# Patient Record
Sex: Female | Born: 1935
Health system: Southern US, Community
[De-identification: ages and names within clinical notes are randomized; demographics above are authoritative.]

## PROBLEM LIST (undated history)

## (undated) DIAGNOSIS — E785 Hyperlipidemia, unspecified: Secondary | ICD-10-CM

## (undated) DIAGNOSIS — M160 Bilateral primary osteoarthritis of hip: Secondary | ICD-10-CM

## (undated) DIAGNOSIS — Z8639 Personal history of other endocrine, nutritional and metabolic disease: Secondary | ICD-10-CM

## (undated) DIAGNOSIS — H47011 Ischemic optic neuropathy, right eye: Secondary | ICD-10-CM

## (undated) DIAGNOSIS — E079 Disorder of thyroid, unspecified: Secondary | ICD-10-CM

## (undated) HISTORY — DX: Ischemic optic neuropathy, right eye: H47.011

## (undated) HISTORY — DX: Bilateral primary osteoarthritis of hip: M16.0

## (undated) HISTORY — PX: TOTAL HIP ARTHROPLASTY: SHX124

## (undated) HISTORY — DX: Personal history of other endocrine, nutritional and metabolic disease: Z86.39

## (undated) HISTORY — DX: Hyperlipidemia, unspecified: E78.5

## (undated) HISTORY — PX: ANTERIOR FUSION LUMBAR SPINE: SUR629

## (undated) HISTORY — PX: TOTAL ABDOMINAL HYSTERECTOMY: SHX209

---

## 1998-04-30 ENCOUNTER — Other Ambulatory Visit: Admission: RE | Admit: 1998-04-30 | Discharge: 1998-04-30 | Payer: Self-pay | Admitting: *Deleted

## 1999-04-05 ENCOUNTER — Encounter: Payer: Self-pay | Admitting: *Deleted

## 1999-04-05 ENCOUNTER — Encounter: Admission: RE | Admit: 1999-04-05 | Discharge: 1999-04-05 | Payer: Self-pay | Admitting: *Deleted

## 1999-04-26 ENCOUNTER — Other Ambulatory Visit: Admission: RE | Admit: 1999-04-26 | Discharge: 1999-04-26 | Payer: Self-pay | Admitting: *Deleted

## 1999-09-26 ENCOUNTER — Encounter: Admission: RE | Admit: 1999-09-26 | Discharge: 1999-09-26 | Payer: Self-pay | Admitting: Family Medicine

## 1999-09-26 ENCOUNTER — Encounter: Payer: Self-pay | Admitting: Family Medicine

## 2000-05-12 ENCOUNTER — Other Ambulatory Visit: Admission: RE | Admit: 2000-05-12 | Discharge: 2000-05-12 | Payer: Self-pay | Admitting: *Deleted

## 2001-03-17 ENCOUNTER — Encounter: Admission: RE | Admit: 2001-03-17 | Discharge: 2001-03-17 | Payer: Self-pay | Admitting: *Deleted

## 2001-03-17 ENCOUNTER — Encounter: Payer: Self-pay | Admitting: *Deleted

## 2002-04-25 ENCOUNTER — Encounter: Payer: Self-pay | Admitting: *Deleted

## 2002-04-25 ENCOUNTER — Encounter: Admission: RE | Admit: 2002-04-25 | Discharge: 2002-04-25 | Payer: Self-pay | Admitting: *Deleted

## 2002-08-03 ENCOUNTER — Encounter: Admission: RE | Admit: 2002-08-03 | Discharge: 2002-08-03 | Payer: Self-pay | Admitting: Orthopedic Surgery

## 2002-08-03 ENCOUNTER — Encounter: Payer: Self-pay | Admitting: Orthopedic Surgery

## 2002-08-17 ENCOUNTER — Encounter: Admission: RE | Admit: 2002-08-17 | Discharge: 2002-08-17 | Payer: Self-pay | Admitting: Orthopedic Surgery

## 2002-08-17 ENCOUNTER — Encounter: Payer: Self-pay | Admitting: Orthopedic Surgery

## 2002-08-31 ENCOUNTER — Encounter: Payer: Self-pay | Admitting: Orthopedic Surgery

## 2002-08-31 ENCOUNTER — Encounter: Admission: RE | Admit: 2002-08-31 | Discharge: 2002-08-31 | Payer: Self-pay | Admitting: Orthopedic Surgery

## 2002-10-05 ENCOUNTER — Encounter: Admission: RE | Admit: 2002-10-05 | Discharge: 2002-10-05 | Payer: Self-pay | Admitting: Family Medicine

## 2002-10-05 ENCOUNTER — Encounter: Payer: Self-pay | Admitting: Family Medicine

## 2002-10-20 ENCOUNTER — Ambulatory Visit (HOSPITAL_COMMUNITY): Admission: RE | Admit: 2002-10-20 | Discharge: 2002-10-20 | Payer: Self-pay | Admitting: Orthopedic Surgery

## 2003-03-02 ENCOUNTER — Encounter: Payer: Self-pay | Admitting: Orthopedic Surgery

## 2003-03-02 ENCOUNTER — Encounter: Admission: RE | Admit: 2003-03-02 | Discharge: 2003-03-02 | Payer: Self-pay | Admitting: Orthopedic Surgery

## 2003-12-28 ENCOUNTER — Encounter: Admission: RE | Admit: 2003-12-28 | Discharge: 2003-12-28 | Payer: Self-pay | Admitting: Family Medicine

## 2004-04-03 ENCOUNTER — Encounter: Admission: RE | Admit: 2004-04-03 | Discharge: 2004-04-03 | Payer: Self-pay | Admitting: Family Medicine

## 2004-10-08 ENCOUNTER — Encounter: Admission: RE | Admit: 2004-10-08 | Discharge: 2004-10-08 | Payer: Self-pay | Admitting: Family Medicine

## 2006-12-25 ENCOUNTER — Encounter: Admission: RE | Admit: 2006-12-25 | Discharge: 2006-12-25 | Payer: Self-pay | Admitting: Family Medicine

## 2006-12-29 ENCOUNTER — Encounter: Admission: RE | Admit: 2006-12-29 | Discharge: 2006-12-29 | Payer: Self-pay | Admitting: Family Medicine

## 2007-05-14 ENCOUNTER — Encounter: Admission: RE | Admit: 2007-05-14 | Discharge: 2007-05-14 | Payer: Self-pay | Admitting: Unknown Physician Specialty

## 2010-04-20 ENCOUNTER — Emergency Department (HOSPITAL_BASED_OUTPATIENT_CLINIC_OR_DEPARTMENT_OTHER)
Admission: EM | Admit: 2010-04-20 | Discharge: 2010-04-20 | Payer: Self-pay | Source: Home / Self Care | Admitting: Emergency Medicine

## 2010-04-20 ENCOUNTER — Ambulatory Visit: Payer: Self-pay | Admitting: Interventional Radiology

## 2010-04-24 ENCOUNTER — Ambulatory Visit (HOSPITAL_BASED_OUTPATIENT_CLINIC_OR_DEPARTMENT_OTHER)
Admission: RE | Admit: 2010-04-24 | Discharge: 2010-04-24 | Payer: Self-pay | Source: Home / Self Care | Admitting: Family Medicine

## 2010-04-24 ENCOUNTER — Ambulatory Visit: Payer: Self-pay | Admitting: Radiology

## 2010-06-15 ENCOUNTER — Encounter: Payer: Self-pay | Admitting: Family Medicine

## 2010-08-06 LAB — COMPREHENSIVE METABOLIC PANEL
AST: 23 U/L (ref 0–37)
BUN: 21 mg/dL (ref 6–23)
Creatinine, Ser: 0.9 mg/dL (ref 0.4–1.2)
GFR calc non Af Amer: >60 mL/min (ref >60)
Glucose, Bld: 91 mg/dL (ref 70–99)
Total Bilirubin: 0.7 mg/dL (ref 0.3–1.2)

## 2010-08-06 LAB — URINALYSIS, ROUTINE W REFLEX MICROSCOPIC
Bilirubin Urine: NEGATIVE
Glucose, UA: NEGATIVE mg/dL
Hgb urine dipstick: NEGATIVE
Nitrite: NEGATIVE
Protein, ur: NEGATIVE mg/dL
Urobilinogen, UA: 0.2 mg/dL (ref 0.0–1.0)

## 2010-08-06 LAB — DIFFERENTIAL
Basophils Absolute: 0 10*3/uL (ref 0.0–0.1)
Basophils Relative: 0 % (ref 0–1)
Eosinophils Absolute: 0 10*3/uL (ref 0.0–0.7)
Eosinophils Relative: 0 % (ref 0–5)
Lymphs Abs: 1.8 10*3/uL (ref 0.7–4.0)
Metamyelocytes Relative: 1 %
Monocytes Absolute: 0.2 10*3/uL (ref 0.1–1.0)
Myelocytes: 0 %
Neutrophils Relative %: 77 % (ref 43–77)
Promyelocytes Absolute: 0 %
nRBC: 0 /100 WBC

## 2010-08-06 LAB — CBC
Hemoglobin: 15.3 g/dL — ABNORMAL HIGH (ref 12.0–15.0)
MCH: 35.3 pg — ABNORMAL HIGH (ref 26.0–34.0)
MCHC: 35 g/dL (ref 30.0–36.0)
WBC: 18.3 10*3/uL — ABNORMAL HIGH (ref 4.0–10.5)

## 2010-08-06 LAB — URINE CULTURE: Culture  Setup Time: 201111270025

## 2010-12-31 ENCOUNTER — Other Ambulatory Visit: Payer: Self-pay | Admitting: *Deleted

## 2010-12-31 DIAGNOSIS — Z1231 Encounter for screening mammogram for malignant neoplasm of breast: Secondary | ICD-10-CM

## 2011-01-08 ENCOUNTER — Other Ambulatory Visit: Payer: Self-pay | Admitting: Unknown Physician Specialty

## 2011-01-08 ENCOUNTER — Ambulatory Visit
Admission: RE | Admit: 2011-01-08 | Discharge: 2011-01-08 | Disposition: A | Discharge status: Home / Self Care | Payer: BLUE CROSS/BLUE SHIELD | Source: Ambulatory Visit | Attending: *Deleted | Admitting: *Deleted

## 2011-01-08 DIAGNOSIS — Z1231 Encounter for screening mammogram for malignant neoplasm of breast: Secondary | ICD-10-CM

## 2011-05-28 DIAGNOSIS — L259 Unspecified contact dermatitis, unspecified cause: Secondary | ICD-10-CM | POA: Diagnosis not present

## 2011-10-17 DIAGNOSIS — J069 Acute upper respiratory infection, unspecified: Secondary | ICD-10-CM | POA: Diagnosis not present

## 2012-01-29 DIAGNOSIS — E039 Hypothyroidism, unspecified: Secondary | ICD-10-CM | POA: Diagnosis not present

## 2012-04-14 DIAGNOSIS — Z0000 Encounter for general adult medical examination without abnormal findings: Secondary | ICD-10-CM | POA: Diagnosis not present

## 2012-04-14 DIAGNOSIS — E039 Hypothyroidism, unspecified: Secondary | ICD-10-CM | POA: Diagnosis not present

## 2012-04-14 DIAGNOSIS — J069 Acute upper respiratory infection, unspecified: Secondary | ICD-10-CM | POA: Diagnosis not present

## 2012-04-15 DIAGNOSIS — E039 Hypothyroidism, unspecified: Secondary | ICD-10-CM | POA: Diagnosis not present

## 2012-04-15 DIAGNOSIS — Z0000 Encounter for general adult medical examination without abnormal findings: Secondary | ICD-10-CM | POA: Diagnosis not present

## 2012-04-15 DIAGNOSIS — Z136 Encounter for screening for cardiovascular disorders: Secondary | ICD-10-CM | POA: Diagnosis not present

## 2012-07-14 DIAGNOSIS — H35369 Drusen (degenerative) of macula, unspecified eye: Secondary | ICD-10-CM | POA: Diagnosis not present

## 2012-07-14 DIAGNOSIS — H04129 Dry eye syndrome of unspecified lacrimal gland: Secondary | ICD-10-CM | POA: Diagnosis not present

## 2012-07-14 DIAGNOSIS — H2510 Age-related nuclear cataract, unspecified eye: Secondary | ICD-10-CM | POA: Diagnosis not present

## 2012-11-19 ENCOUNTER — Other Ambulatory Visit: Payer: Self-pay

## 2012-11-19 DIAGNOSIS — Z1231 Encounter for screening mammogram for malignant neoplasm of breast: Secondary | ICD-10-CM

## 2012-12-09 ENCOUNTER — Ambulatory Visit
Admission: RE | Admit: 2012-12-09 | Discharge: 2012-12-09 | Disposition: A | Discharge status: Home / Self Care | Payer: Medicare Other | Source: Ambulatory Visit

## 2012-12-09 DIAGNOSIS — Z1231 Encounter for screening mammogram for malignant neoplasm of breast: Secondary | ICD-10-CM | POA: Diagnosis not present

## 2012-12-15 DIAGNOSIS — N951 Menopausal and female climacteric states: Secondary | ICD-10-CM | POA: Diagnosis not present

## 2013-03-03 DIAGNOSIS — M542 Cervicalgia: Secondary | ICD-10-CM | POA: Diagnosis not present

## 2013-03-20 ENCOUNTER — Emergency Department (HOSPITAL_BASED_OUTPATIENT_CLINIC_OR_DEPARTMENT_OTHER): Payer: Medicare Other

## 2013-03-20 ENCOUNTER — Emergency Department (HOSPITAL_BASED_OUTPATIENT_CLINIC_OR_DEPARTMENT_OTHER)
Admission: EM | Admit: 2013-03-20 | Discharge: 2013-03-20 | Disposition: A | Discharge status: Home / Self Care | Payer: Medicare Other | Attending: Emergency Medicine | Admitting: Emergency Medicine

## 2013-03-20 ENCOUNTER — Encounter (HOSPITAL_BASED_OUTPATIENT_CLINIC_OR_DEPARTMENT_OTHER): Payer: Self-pay | Admitting: Emergency Medicine

## 2013-03-20 DIAGNOSIS — Z79899 Other long term (current) drug therapy: Secondary | ICD-10-CM | POA: Insufficient documentation

## 2013-03-20 DIAGNOSIS — E039 Hypothyroidism, unspecified: Secondary | ICD-10-CM | POA: Diagnosis not present

## 2013-03-20 DIAGNOSIS — Z7982 Long term (current) use of aspirin: Secondary | ICD-10-CM | POA: Diagnosis not present

## 2013-03-20 DIAGNOSIS — R350 Frequency of micturition: Secondary | ICD-10-CM | POA: Diagnosis not present

## 2013-03-20 DIAGNOSIS — R531 Weakness: Secondary | ICD-10-CM

## 2013-03-20 DIAGNOSIS — R002 Palpitations: Secondary | ICD-10-CM

## 2013-03-20 DIAGNOSIS — R5381 Other malaise: Secondary | ICD-10-CM | POA: Insufficient documentation

## 2013-03-20 DIAGNOSIS — F17200 Nicotine dependence, unspecified, uncomplicated: Secondary | ICD-10-CM | POA: Diagnosis not present

## 2013-03-20 HISTORY — DX: Disorder of thyroid, unspecified: E07.9

## 2013-03-20 LAB — URINALYSIS, ROUTINE W REFLEX MICROSCOPIC
Bilirubin Urine: NEGATIVE
Glucose, UA: NEGATIVE mg/dL
Hgb urine dipstick: NEGATIVE
Nitrite: NEGATIVE
Protein, ur: NEGATIVE mg/dL
Specific Gravity, Urine: 1.008 (ref 1.005–1.030)
Urobilinogen, UA: 0.2 mg/dL (ref 0.0–1.0)
pH: 6.5 (ref 5.0–8.0)

## 2013-03-20 LAB — CBC WITH DIFFERENTIAL/PLATELET
Basophils Absolute: 0.1 10*3/uL (ref 0.0–0.1)
Eosinophils Absolute: 0.1 10*3/uL (ref 0.0–0.7)
Lymphocytes Relative: 26 % (ref 12–46)
Neutrophils Relative %: 68 % (ref 43–77)
Platelets: 311 10*3/uL (ref 150–400)
WBC: 11.2 10*3/uL — ABNORMAL HIGH (ref 4.0–10.5)

## 2013-03-20 LAB — TROPONIN I: Troponin I: <0.3 ng/mL (ref <0.30)

## 2013-03-20 LAB — COMPREHENSIVE METABOLIC PANEL
BUN: 12 mg/dL (ref 6–23)
GFR calc Af Amer: 79 mL/min — ABNORMAL LOW (ref >90)

## 2013-03-20 LAB — TSH: TSH: 1.816 u[IU]/mL (ref 0.350–4.500)

## 2013-03-20 NOTE — ED Notes (Signed)
Pt aware that we need a urine specimen.  Given water on request to facilitate this process.

## 2013-03-20 NOTE — ED Provider Notes (Signed)
CSN: [id number]     Arrival date & time 03/20/13  1237 History   First MD Initiated Contact with Patient 03/20/13 1246     Chief Complaint  Patient presents with  . Weakness   (Consider location/radiation/quality/duration/timing/severity/associated sxs/prior Treatment) HPI Comments: Patient is a 79 year old female with past medical history of hypothyroidism and bilateral hip replacement. She presents today with complaints of weakness which has been worsening over the past several days. She states that last night she began having palpitations and she could feel her heart beating. This was rising her to have difficulty falling asleep. She denies any chest pain or shortness of breath, but states she generally feels orally. She reports some frequent urination but denies dysuria or fevers.  Patient is a 79 y.o. female presenting with weakness. The history is provided by the patient.  Weakness This is a new problem. The current episode started 2 days ago. The problem occurs constantly. The problem has been gradually worsening. Pertinent negatives include no chest pain, no abdominal pain, no headaches and no shortness of breath. Nothing aggravates the symptoms. Nothing relieves the symptoms. She has tried nothing for the symptoms. The treatment provided no relief.    Past Medical History  Diagnosis Date  . Thyroid disease    History reviewed. No pertinent past surgical history. No family history on file. History  Substance Use Topics  . Smoking status: Current Every Day Smoker -- 1.00 packs/day    Types: Cigarettes  . Smokeless tobacco: Not on file  . Alcohol Use: Not on file   OB History   Grav Para Term Preterm Abortions TAB SAB Ect Mult Living                 Review of Systems  Constitutional: Positive for fatigue. Negative for fever and chills.  Respiratory: Negative for shortness of breath.   Cardiovascular: Negative for chest pain.  Gastrointestinal: Negative for abdominal pain.   Neurological: Positive for weakness. Negative for headaches.  All other systems reviewed and are negative.    Allergies  Augmentin and Sulfa antibiotics  Home Medications   Current Outpatient Rx  Name  Route  Sig  Dispense  Refill  . aspirin 325 MG tablet   Oral   Take 325 mg by mouth daily.         . calcium-vitamin D (OSCAL WITH D) 500-200 MG-UNIT per tablet   Oral   Take 1 tablet by mouth.         . estrogens, conjugated, (PREMARIN) 0.625 MG tablet   Oral   Take 0.625 mg by mouth daily. Take daily for 21 days then do not take for 7 days.         Marland Kitchen levothyroxine (SYNTHROID, LEVOTHROID) 100 MCG tablet   Oral   Take 100 mcg by mouth daily before breakfast.          Pulse 81  Resp 16  SpO2 96% Physical Exam  Nursing note and vitals reviewed. Constitutional: She is oriented to person, place, and time. She appears well-developed and well-nourished. No distress.  HENT:  Head: Normocephalic and atraumatic.  Neck: Normal range of motion. Neck supple.  Cardiovascular: Normal rate and regular rhythm.  Exam reveals no gallop and no friction rub.   No murmur heard. Pulmonary/Chest: Effort normal and breath sounds normal. No respiratory distress. She has no wheezes.  Abdominal: Soft. Bowel sounds are normal. She exhibits no distension. There is no tenderness.  Musculoskeletal: Normal range of motion.  Neurological:  She is alert and oriented to person, place, and time.  Skin: Skin is warm and dry. She is not diaphoretic.    ED Course  Procedures (including critical care time) Labs Review Labs Reviewed  CBC WITH DIFFERENTIAL  COMPREHENSIVE METABOLIC PANEL  TROPONIN I  TSH  URINALYSIS, ROUTINE W REFLEX MICROSCOPIC   Imaging Review No results found.  EKG Interpretation     Ventricular Rate:  74 PR Interval:  154 QRS Duration: 80 QT Interval:  384 QTC Calculation: 426 R Axis:   84 Text Interpretation:  Normal sinus rhythm with sinus arrhythmia  Nonspecific ST and T wave abnormality Abnormal ECG  No priors for comparison            MDM  No diagnosis found. Patient is a 79 year old female who presents with complaints of palpitations and weakness she experienced over the night. She has been taking Aleve for some discomfort she has had in her neck. She was told to do this by her primary Dr. is convinced that that is what caused her palpitations last night. Her EKG shows nonspecific T wave abnormalities in the lateral leads, however there are no old for comparison. She's experienced no chest pain and her troponin is negative. I doubt there is any coronary ischemia. The remainder the workup is unremarkable including laboratory studies and chest x-ray and urinalysis.  Return to the patient's room to discuss the results of the tests with her and she was already dressed sitting up on the bed and wanting to go home. I feel as though this is appropriate, but have advised her to followup with her doctor in the next week to discuss further workup if her symptoms do not improve or worsen. She is to return to the ER she experiences any problems.    Geoffery Lyons, MD 03/20/13 [phone]

## 2013-03-20 NOTE — ED Notes (Signed)
Patient states that she saw her primary MD 2 weeks ago for neck pain. Has been taking aleve(2 in am and 2 in pm) for pinched nerve in neck.  Patient reports that she has been feeling shaky and heart palpitations since taking the aleve. She has stopped same and was feeling better until she took ibuprofen last pm fpr the neck pain. ambulatpry to tx room

## 2013-04-15 DIAGNOSIS — Z131 Encounter for screening for diabetes mellitus: Secondary | ICD-10-CM | POA: Diagnosis not present

## 2013-04-15 DIAGNOSIS — Z0000 Encounter for general adult medical examination without abnormal findings: Secondary | ICD-10-CM | POA: Diagnosis not present

## 2013-04-15 DIAGNOSIS — E039 Hypothyroidism, unspecified: Secondary | ICD-10-CM | POA: Diagnosis not present

## 2013-12-15 DIAGNOSIS — L03119 Cellulitis of unspecified part of limb: Secondary | ICD-10-CM | POA: Diagnosis not present

## 2013-12-15 DIAGNOSIS — L02619 Cutaneous abscess of unspecified foot: Secondary | ICD-10-CM | POA: Diagnosis not present

## 2014-01-10 DIAGNOSIS — N951 Menopausal and female climacteric states: Secondary | ICD-10-CM | POA: Diagnosis not present

## 2014-01-25 DIAGNOSIS — N8111 Cystocele, midline: Secondary | ICD-10-CM | POA: Diagnosis not present

## 2014-01-25 DIAGNOSIS — N815 Vaginal enterocele: Secondary | ICD-10-CM | POA: Diagnosis not present

## 2014-02-28 DIAGNOSIS — R05 Cough: Secondary | ICD-10-CM | POA: Diagnosis not present

## 2014-02-28 DIAGNOSIS — N819 Female genital prolapse, unspecified: Secondary | ICD-10-CM | POA: Diagnosis not present

## 2014-02-28 DIAGNOSIS — R350 Frequency of micturition: Secondary | ICD-10-CM | POA: Diagnosis not present

## 2014-02-28 DIAGNOSIS — R634 Abnormal weight loss: Secondary | ICD-10-CM | POA: Diagnosis not present

## 2014-02-28 DIAGNOSIS — F17200 Nicotine dependence, unspecified, uncomplicated: Secondary | ICD-10-CM | POA: Diagnosis not present

## 2014-02-28 DIAGNOSIS — N815 Vaginal enterocele: Secondary | ICD-10-CM | POA: Diagnosis not present

## 2014-03-01 DIAGNOSIS — R350 Frequency of micturition: Secondary | ICD-10-CM | POA: Diagnosis not present

## 2014-03-01 DIAGNOSIS — R634 Abnormal weight loss: Secondary | ICD-10-CM | POA: Diagnosis not present

## 2014-03-17 DIAGNOSIS — R350 Frequency of micturition: Secondary | ICD-10-CM | POA: Diagnosis not present

## 2014-03-17 DIAGNOSIS — R7309 Other abnormal glucose: Secondary | ICD-10-CM | POA: Diagnosis not present

## 2014-03-17 DIAGNOSIS — R634 Abnormal weight loss: Secondary | ICD-10-CM | POA: Diagnosis not present

## 2014-03-17 DIAGNOSIS — R899 Unspecified abnormal finding in specimens from other organs, systems and tissues: Secondary | ICD-10-CM | POA: Diagnosis not present

## 2014-04-10 DIAGNOSIS — Z23 Encounter for immunization: Secondary | ICD-10-CM | POA: Diagnosis not present

## 2014-04-10 DIAGNOSIS — R51 Headache: Secondary | ICD-10-CM | POA: Diagnosis not present

## 2014-04-26 DIAGNOSIS — E039 Hypothyroidism, unspecified: Secondary | ICD-10-CM | POA: Diagnosis not present

## 2014-04-26 DIAGNOSIS — Z0001 Encounter for general adult medical examination with abnormal findings: Secondary | ICD-10-CM | POA: Diagnosis not present

## 2014-04-26 DIAGNOSIS — Z131 Encounter for screening for diabetes mellitus: Secondary | ICD-10-CM | POA: Diagnosis not present

## 2014-06-02 DIAGNOSIS — S81811A Laceration without foreign body, right lower leg, initial encounter: Secondary | ICD-10-CM | POA: Diagnosis not present

## 2014-06-02 DIAGNOSIS — Z6824 Body mass index (BMI) 24.0-24.9, adult: Secondary | ICD-10-CM | POA: Diagnosis not present

## 2014-06-02 DIAGNOSIS — Z23 Encounter for immunization: Secondary | ICD-10-CM | POA: Diagnosis not present

## 2014-06-05 DIAGNOSIS — S81801D Unspecified open wound, right lower leg, subsequent encounter: Secondary | ICD-10-CM | POA: Diagnosis not present

## 2014-08-29 DIAGNOSIS — H2513 Age-related nuclear cataract, bilateral: Secondary | ICD-10-CM | POA: Diagnosis not present

## 2014-08-29 DIAGNOSIS — H10413 Chronic giant papillary conjunctivitis, bilateral: Secondary | ICD-10-CM | POA: Diagnosis not present

## 2014-08-29 DIAGNOSIS — H3531 Nonexudative age-related macular degeneration: Secondary | ICD-10-CM | POA: Diagnosis not present

## 2014-10-27 DIAGNOSIS — L247 Irritant contact dermatitis due to plants, except food: Secondary | ICD-10-CM | POA: Diagnosis not present

## 2014-10-30 DIAGNOSIS — L247 Irritant contact dermatitis due to plants, except food: Secondary | ICD-10-CM | POA: Diagnosis not present

## 2014-12-18 DIAGNOSIS — E039 Hypothyroidism, unspecified: Secondary | ICD-10-CM | POA: Diagnosis not present

## 2015-04-13 DIAGNOSIS — Z23 Encounter for immunization: Secondary | ICD-10-CM | POA: Diagnosis not present

## 2015-06-28 DIAGNOSIS — Z131 Encounter for screening for diabetes mellitus: Secondary | ICD-10-CM | POA: Diagnosis not present

## 2015-06-28 DIAGNOSIS — Z23 Encounter for immunization: Secondary | ICD-10-CM | POA: Diagnosis not present

## 2015-06-28 DIAGNOSIS — E039 Hypothyroidism, unspecified: Secondary | ICD-10-CM | POA: Diagnosis not present

## 2015-06-28 DIAGNOSIS — Z0000 Encounter for general adult medical examination without abnormal findings: Secondary | ICD-10-CM | POA: Diagnosis not present

## 2015-07-12 DIAGNOSIS — N951 Menopausal and female climacteric states: Secondary | ICD-10-CM | POA: Diagnosis not present

## 2015-07-12 DIAGNOSIS — Z0000 Encounter for general adult medical examination without abnormal findings: Secondary | ICD-10-CM | POA: Diagnosis not present

## 2015-07-24 DIAGNOSIS — N812 Incomplete uterovaginal prolapse: Secondary | ICD-10-CM | POA: Diagnosis not present

## 2015-09-05 DIAGNOSIS — H5703 Miosis: Secondary | ICD-10-CM | POA: Diagnosis not present

## 2015-09-05 DIAGNOSIS — H10413 Chronic giant papillary conjunctivitis, bilateral: Secondary | ICD-10-CM | POA: Diagnosis not present

## 2015-09-05 DIAGNOSIS — H353131 Nonexudative age-related macular degeneration, bilateral, early dry stage: Secondary | ICD-10-CM | POA: Diagnosis not present

## 2015-09-05 DIAGNOSIS — H2513 Age-related nuclear cataract, bilateral: Secondary | ICD-10-CM | POA: Diagnosis not present

## 2015-09-24 DIAGNOSIS — M5416 Radiculopathy, lumbar region: Secondary | ICD-10-CM | POA: Diagnosis not present

## 2015-09-24 DIAGNOSIS — M79605 Pain in left leg: Secondary | ICD-10-CM | POA: Diagnosis not present

## 2015-09-24 DIAGNOSIS — Z6824 Body mass index (BMI) 24.0-24.9, adult: Secondary | ICD-10-CM | POA: Diagnosis not present

## 2015-10-23 DIAGNOSIS — M5416 Radiculopathy, lumbar region: Secondary | ICD-10-CM | POA: Diagnosis not present

## 2015-10-25 DIAGNOSIS — M5416 Radiculopathy, lumbar region: Secondary | ICD-10-CM | POA: Diagnosis not present

## 2015-10-30 DIAGNOSIS — M5416 Radiculopathy, lumbar region: Secondary | ICD-10-CM | POA: Diagnosis not present

## 2015-11-02 DIAGNOSIS — M5416 Radiculopathy, lumbar region: Secondary | ICD-10-CM | POA: Diagnosis not present

## 2015-11-06 DIAGNOSIS — M5416 Radiculopathy, lumbar region: Secondary | ICD-10-CM | POA: Diagnosis not present

## 2015-11-09 DIAGNOSIS — M5416 Radiculopathy, lumbar region: Secondary | ICD-10-CM | POA: Diagnosis not present

## 2015-11-13 DIAGNOSIS — M5416 Radiculopathy, lumbar region: Secondary | ICD-10-CM | POA: Diagnosis not present

## 2015-11-16 DIAGNOSIS — M5416 Radiculopathy, lumbar region: Secondary | ICD-10-CM | POA: Diagnosis not present

## 2016-03-31 DIAGNOSIS — Z23 Encounter for immunization: Secondary | ICD-10-CM | POA: Diagnosis not present

## 2016-07-01 DIAGNOSIS — Z131 Encounter for screening for diabetes mellitus: Secondary | ICD-10-CM | POA: Diagnosis not present

## 2016-07-01 DIAGNOSIS — E039 Hypothyroidism, unspecified: Secondary | ICD-10-CM | POA: Diagnosis not present

## 2016-07-04 DIAGNOSIS — E039 Hypothyroidism, unspecified: Secondary | ICD-10-CM | POA: Diagnosis not present

## 2016-07-04 DIAGNOSIS — Z0000 Encounter for general adult medical examination without abnormal findings: Secondary | ICD-10-CM | POA: Diagnosis not present

## 2016-10-24 DIAGNOSIS — H47011 Ischemic optic neuropathy, right eye: Secondary | ICD-10-CM

## 2016-10-24 HISTORY — DX: Ischemic optic neuropathy, right eye: H47.011

## 2016-11-20 DIAGNOSIS — H353131 Nonexudative age-related macular degeneration, bilateral, early dry stage: Secondary | ICD-10-CM | POA: Diagnosis not present

## 2016-11-20 DIAGNOSIS — H47011 Ischemic optic neuropathy, right eye: Secondary | ICD-10-CM | POA: Diagnosis not present

## 2016-11-20 DIAGNOSIS — H2511 Age-related nuclear cataract, right eye: Secondary | ICD-10-CM | POA: Diagnosis not present

## 2016-11-20 DIAGNOSIS — H353113 Nonexudative age-related macular degeneration, right eye, advanced atrophic without subfoveal involvement: Secondary | ICD-10-CM | POA: Diagnosis not present

## 2016-11-20 DIAGNOSIS — H35351 Cystoid macular degeneration, right eye: Secondary | ICD-10-CM | POA: Diagnosis not present

## 2016-11-20 DIAGNOSIS — H43821 Vitreomacular adhesion, right eye: Secondary | ICD-10-CM | POA: Diagnosis not present

## 2016-11-20 DIAGNOSIS — H2513 Age-related nuclear cataract, bilateral: Secondary | ICD-10-CM | POA: Diagnosis not present

## 2016-11-20 DIAGNOSIS — H353123 Nonexudative age-related macular degeneration, left eye, advanced atrophic without subfoveal involvement: Secondary | ICD-10-CM | POA: Diagnosis not present

## 2016-11-27 DIAGNOSIS — H47011 Ischemic optic neuropathy, right eye: Secondary | ICD-10-CM | POA: Diagnosis not present

## 2016-12-05 DIAGNOSIS — H2513 Age-related nuclear cataract, bilateral: Secondary | ICD-10-CM | POA: Diagnosis not present

## 2016-12-05 DIAGNOSIS — H353131 Nonexudative age-related macular degeneration, bilateral, early dry stage: Secondary | ICD-10-CM | POA: Diagnosis not present

## 2016-12-05 DIAGNOSIS — H35351 Cystoid macular degeneration, right eye: Secondary | ICD-10-CM | POA: Diagnosis not present

## 2016-12-11 DIAGNOSIS — H47011 Ischemic optic neuropathy, right eye: Secondary | ICD-10-CM | POA: Diagnosis not present

## 2016-12-16 ENCOUNTER — Other Ambulatory Visit (HOSPITAL_BASED_OUTPATIENT_CLINIC_OR_DEPARTMENT_OTHER): Payer: Self-pay | Admitting: Family Medicine

## 2016-12-16 DIAGNOSIS — H47011 Ischemic optic neuropathy, right eye: Secondary | ICD-10-CM

## 2016-12-17 ENCOUNTER — Ambulatory Visit (HOSPITAL_BASED_OUTPATIENT_CLINIC_OR_DEPARTMENT_OTHER)
Admission: RE | Admit: 2016-12-17 | Discharge: 2016-12-17 | Disposition: A | Discharge status: Home / Self Care | Payer: Medicare Other | Source: Ambulatory Visit | Attending: Family Medicine | Admitting: Family Medicine

## 2016-12-17 DIAGNOSIS — H47011 Ischemic optic neuropathy, right eye: Secondary | ICD-10-CM | POA: Diagnosis not present

## 2017-01-13 ENCOUNTER — Other Ambulatory Visit: Payer: Self-pay | Admitting: Family Medicine

## 2017-01-13 DIAGNOSIS — H47011 Ischemic optic neuropathy, right eye: Secondary | ICD-10-CM

## 2017-01-29 ENCOUNTER — Ambulatory Visit
Admission: RE | Admit: 2017-01-29 | Discharge: 2017-01-29 | Disposition: A | Discharge status: Home / Self Care | Payer: Medicare Other | Source: Ambulatory Visit | Attending: Family Medicine | Admitting: Family Medicine

## 2017-01-29 DIAGNOSIS — H47011 Ischemic optic neuropathy, right eye: Secondary | ICD-10-CM

## 2017-01-29 DIAGNOSIS — H5461 Unqualified visual loss, right eye, normal vision left eye: Secondary | ICD-10-CM | POA: Diagnosis not present

## 2017-03-10 DIAGNOSIS — Z23 Encounter for immunization: Secondary | ICD-10-CM | POA: Diagnosis not present

## 2017-03-10 DIAGNOSIS — H547 Unspecified visual loss: Secondary | ICD-10-CM | POA: Diagnosis not present

## 2017-07-20 DIAGNOSIS — Z131 Encounter for screening for diabetes mellitus: Secondary | ICD-10-CM | POA: Diagnosis not present

## 2017-07-20 DIAGNOSIS — Z13220 Encounter for screening for lipoid disorders: Secondary | ICD-10-CM | POA: Diagnosis not present

## 2017-07-20 DIAGNOSIS — E039 Hypothyroidism, unspecified: Secondary | ICD-10-CM | POA: Diagnosis not present

## 2017-07-22 DIAGNOSIS — E875 Hyperkalemia: Secondary | ICD-10-CM | POA: Diagnosis not present

## 2017-07-23 DIAGNOSIS — E039 Hypothyroidism, unspecified: Secondary | ICD-10-CM | POA: Diagnosis not present

## 2017-07-23 DIAGNOSIS — Z0000 Encounter for general adult medical examination without abnormal findings: Secondary | ICD-10-CM | POA: Diagnosis not present

## 2017-07-23 DIAGNOSIS — E7800 Pure hypercholesterolemia, unspecified: Secondary | ICD-10-CM | POA: Diagnosis not present

## 2017-07-23 DIAGNOSIS — H47011 Ischemic optic neuropathy, right eye: Secondary | ICD-10-CM | POA: Diagnosis not present

## 2017-07-23 DIAGNOSIS — E875 Hyperkalemia: Secondary | ICD-10-CM | POA: Diagnosis not present

## 2017-07-23 DIAGNOSIS — Z96643 Presence of artificial hip joint, bilateral: Secondary | ICD-10-CM | POA: Diagnosis not present

## 2017-07-27 ENCOUNTER — Telehealth: Payer: Self-pay

## 2017-07-27 NOTE — Telephone Encounter (Signed)
REFERRAL SENT TO SCHEDULING FROM DR [phone]

## 2017-07-28 ENCOUNTER — Telehealth: Payer: Self-pay

## 2017-07-28 NOTE — Telephone Encounter (Signed)
NOTES FAXED TO [facility] °

## 2017-09-22 ENCOUNTER — Ambulatory Visit (INDEPENDENT_AMBULATORY_CARE_PROVIDER_SITE_OTHER): Payer: Medicare Other | Admitting: Cardiology

## 2017-09-22 ENCOUNTER — Encounter: Payer: Self-pay | Admitting: Cardiology

## 2017-09-22 VITALS — BP 140/64 | HR 74 | Ht 64.0 in | Wt 141.0 lb

## 2017-09-22 DIAGNOSIS — G453 Amaurosis fugax: Secondary | ICD-10-CM

## 2017-09-22 HISTORY — DX: Amaurosis fugax: G45.3

## 2017-09-22 NOTE — Patient Instructions (Addendum)
NO MEDICATION CHANGES      WILL SCHEDULE AT [address]  Your physician has requested that you have an echocardiogram WITH BUBBLE STUDY . Echocardiography is a painless test that uses sound waves to create images of your heart. It provides your doctor with information about the size and shape of your heart and how well your heart's chambers and valves are working. This procedure takes approximately one hour. There are no restrictions for this procedure.    Your physician recommends that you schedule a follow-up appointment in 1 MONTH WITH DR HARDING IF ABNORMAL, IF NORMAL - PRN

## 2017-09-22 NOTE — Progress Notes (Signed)
PCP: Daisy Floro, MD  Clinic Note: Chief Complaint  Patient presents with  . New Admit To SNF    Blurred vision in right eye 10 months ago    HPI: Kathryn Cuevas is a 84 y.o. female who is being seen today for the evaluation of prior CVA/TIA (amaurosis fugax) at the request of Daisy Floro, MD.  She pretty much has minimal past medical history with only recently diagnosed dyslipidemia.  Kathryn Cuevas was  seen by her PCP with labs checked back in February 2019 and referred for cardiology evaluation because of history of possible TIA/CVA last June (she was seen by Dr. Barbette Or -back in June 2018.  She noted that earlier that month she had some decreased vision in the right eye that was getting progressively worse.  Apparently she noticed it most when she can close her left eye --> Presently, she has now lost central vision in the eye, but still has peripheral vision.  She can still read, and drive etc.  Recent Hospitalizations: None  Studies Personally Reviewed - (if available, images/films reviewed: From Epic Chart or Care Everywhere)  Carotid Dopplers December 17, 2016: Normal carotid ultrasound.  Interval History: Sianni presents here today at the request of her PCP because of this episode that happened last June.  Concern is that this was ischemic optic neuropathy.  She did have carotid Dopplers done at the time which were normal.  She indicates that now the only residuals vision issues he has a lack of central vision in the right eye (noted is some blurred vision, No pain).  She only notes that if she closes her left eye.  She has not had any further symptoms to suggest TIA or amaurosis fugax.    She basically indicates that she is not very active because of her L-spine and hip issues that limit her ability to walk.  She denies any sensation of rapid irregular heartbeats or palpitations.  No syncope or near syncope type symptoms. She denies any chest tightness or  pressure with rest or exertion.  No PND, orthopnea or edema.  No claudication although she mostly has radicular pain in both legs.-   ROS: A comprehensive was performed. Review of Systems  Constitutional: Positive for malaise/fatigue (Mostly because she does not do a lot).  HENT: Positive for congestion.   Eyes: Positive for blurred vision. Negative for double vision, pain and discharge.  Respiratory: Positive for cough (Most mornings). Negative for shortness of breath and wheezing.   Musculoskeletal: Positive for back pain (With radiculopathy) and joint pain (Both hips). Negative for myalgias.  Endo/Heme/Allergies: Positive for environmental allergies.  Psychiatric/Behavioral: Negative for memory loss. The patient is not nervous/anxious and does not have insomnia.   All other systems reviewed and are negative.   I have reviewed and (if needed) personally updated the patient's problem list, medications, allergies, past medical and surgical history, social and family history.   Past Medical History:  Diagnosis Date  . History of hyperkalemia    Sporadic episodes leading to cramping etc.  . Hyperlipidemia   . Ischemic optic neuropathy of right eye 10/2016  . Osteoarthritis of both hips    As well as lumbar spine  . Thyroid disease     Past Surgical History:  Procedure Laterality Date  . ANTERIOR FUSION LUMBAR SPINE    . TOTAL ABDOMINAL HYSTERECTOMY     With BSO  . TOTAL HIP ARTHROPLASTY Bilateral  Current Meds  Medication Sig  . levothyroxine (SYNTHROID, LEVOTHROID) 100 MCG tablet Take 100 mcg by mouth daily before breakfast.    Allergies  Allergen Reactions  . Augmentin [Amoxicillin-Pot Clavulanate] Rash  . Sulfa Antibiotics Rash    Social History   Tobacco Use  . Smoking status: Current Every Day Smoker    Packs/day: 1.00    Years: 62.00    Pack years: 62.00    Types: Cigarettes  . Smokeless tobacco: Never Used  Substance Use Topics  . Alcohol use: Not  Currently  . Drug use: Not Currently   Social History   Social History Narrative   Widowed mother of 2 with 4 grandchildren and 5 great-grandchildren.   She lives alone.     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  She has to use a walker but does do some stretching and PT exercises.   She is a current smoker of roughly 1 pack a day for over 62 years.    family history includes Heart attack in her father; Heart disease in her mother.  Wt Readings from Last 3 Encounters:  09/22/17 141 lb (64 kg)    PHYSICAL EXAM BP 140/64 (BP Location: Left Arm, Patient Position: Sitting, Cuff Size: Normal)   Pulse 74   Ht  (1.626 m)   Wt 141 lb (64 kg)   BMI 24.20 kg/m   Physical Exam  Constitutional: She is oriented to person, place, and time. She appears well-developed and well-nourished.  Relatively thin, somewhat frail elderly woman in no acute distress.  HENT:  Head: Normocephalic and atraumatic.  Eyes: EOM are normal. No scleral icterus.  Pupils not equal but are reactive to light.  Neck: Normal range of motion. Neck supple. No hepatojugular reflux and no JVD present. Carotid bruit is not present.  Cardiovascular: Normal rate, regular rhythm, normal heart sounds, intact distal pulses and normal pulses.  Occasional extrasystoles are present. PMI is not displaced. Exam reveals no gallop and no friction rub.  No murmur heard. Pulmonary/Chest: Effort normal and breath sounds normal. No respiratory distress. She has no wheezes. She has no rales. She exhibits tenderness.  Abdominal: Soft. Bowel sounds are normal. She exhibits no distension. There is no tenderness. There is no rebound.  Musculoskeletal: Normal range of motion. She exhibits edema (Trivial).  Neurological: She is alert and oriented to person, place, and time. No cranial nerve deficit.  Skin: Skin is  warm and dry. No erythema.  Psychiatric: She has a normal mood and affect. Her behavior is normal. Judgment and thought content normal.  Nursing note and vitals reviewed.     Adult ECG Report  Rate: 74;  Rhythm: normal sinus rhythm and Normal axis, intervals and durations.  Nonspecific ST and T wave abnormality.;   Narrative Interpretation: Relatively stable EKG.   Other studies Reviewed: Additional studies/ records that were reviewed today include:  Recent Labs:  From PCP dated July 20, 2017: TC 177, TG 109, 8 HDL 40, LDL 112.  BUN/creatinine 14/0.98, TSH 0.78.   ASSESSMENT / PLAN: Problem List Items Addressed This Visit    Amaurosis fugax of right eye - Primary    When I saw her, I do not have any data explaining why she was here.  It sounds like she may have had amaurosis fugax-like symptoms.  After she left,  I was able to obtain data from her PCP and her ophthalmologist. --The diagnosis apparently was ischemic neuropathy.  She is already being evaluated and managed for her lipids, perhaps the only remaining diagnostic tool to evaluate for cardiac source for possible cardioembolic event would be echocardiogram bubble study.  Plan: 2D echo with bubble.   Would recommend aspirin . recommend treating lipids for goal LDL less than 100.  This could likely be done with dietary modification. .      Relevant Orders   EKG 12-Lead (Completed)   ECHOCARDIOGRAM LIMITED BUBBLE STUDY       I spent a total of 30-35 minutes with the patient and chart review. >  50% of the time was spent in direct patient consultation.   Current medicines are reviewed at length with the patient today.  (+/- concerns) none The following changes have been made:  None  Patient Instructions  NO MEDICATION CHANGES      WILL SCHEDULE AT [address]  Your physician has requested that you have an echocardiogram WITH BUBBLE STUDY . Echocardiography is a painless test that uses  sound waves to create images of your heart. It provides your doctor with information about the size and shape of your heart and how well your heart's chambers and valves are working. This procedure takes approximately one hour. There are no restrictions for this procedure.    Your physician recommends that you schedule a follow-up appointment in 1 MONTH WITH DR Argus Caraher IF ABNORMAL, IF NORMAL - PRN         Studies Ordered:   Orders Placed This Encounter  Procedures  . EKG 12-Lead  . ECHOCARDIOGRAM LIMITED BUBBLE STUDY      Bryan Lemma, M.D., M.S. Interventional Cardiologist   Pager # [phone] Phone # [phone] [address] [place], [place] [address]   Thank you for choosing [facility] at [facility]!!

## 2017-09-24 ENCOUNTER — Encounter: Payer: Self-pay | Admitting: Cardiology

## 2017-09-24 NOTE — Assessment & Plan Note (Signed)
When I saw her, I do not have any data explaining why she was here.  It sounds like she may have had amaurosis fugax-like symptoms.  After she left, I was able to obtain data from her PCP and her ophthalmologist. --The diagnosis apparently was ischemic neuropathy.  She is already being evaluated and managed for her lipids, perhaps the only remaining diagnostic tool to evaluate for cardiac source for possible cardioembolic event would be echocardiogram bubble study.  Plan: 2D echo with bubble.   Would recommend aspirin . recommend treating lipids for goal LDL less than 100.  This could likely be done with dietary modification. Marland Kitchen

## 2017-10-21 ENCOUNTER — Other Ambulatory Visit: Payer: Self-pay

## 2017-10-21 ENCOUNTER — Ambulatory Visit (HOSPITAL_COMMUNITY): Payer: Medicare Other | Attending: Cardiovascular Disease

## 2017-10-21 DIAGNOSIS — I517 Cardiomegaly: Secondary | ICD-10-CM | POA: Diagnosis not present

## 2017-10-21 DIAGNOSIS — E785 Hyperlipidemia, unspecified: Secondary | ICD-10-CM | POA: Insufficient documentation

## 2017-10-21 DIAGNOSIS — Z720 Tobacco use: Secondary | ICD-10-CM | POA: Diagnosis not present

## 2017-10-21 DIAGNOSIS — G453 Amaurosis fugax: Secondary | ICD-10-CM | POA: Diagnosis not present

## 2017-11-10 DIAGNOSIS — N993 Prolapse of vaginal vault after hysterectomy: Secondary | ICD-10-CM | POA: Diagnosis not present

## 2018-03-30 DIAGNOSIS — Z23 Encounter for immunization: Secondary | ICD-10-CM | POA: Diagnosis not present

## 2018-07-27 DIAGNOSIS — E039 Hypothyroidism, unspecified: Secondary | ICD-10-CM | POA: Diagnosis not present

## 2018-07-27 DIAGNOSIS — E7800 Pure hypercholesterolemia, unspecified: Secondary | ICD-10-CM | POA: Diagnosis not present

## 2018-07-27 DIAGNOSIS — E875 Hyperkalemia: Secondary | ICD-10-CM | POA: Diagnosis not present

## 2018-08-05 DIAGNOSIS — E039 Hypothyroidism, unspecified: Secondary | ICD-10-CM | POA: Diagnosis not present

## 2018-08-05 DIAGNOSIS — J449 Chronic obstructive pulmonary disease, unspecified: Secondary | ICD-10-CM | POA: Diagnosis not present

## 2018-08-05 DIAGNOSIS — Z0000 Encounter for general adult medical examination without abnormal findings: Secondary | ICD-10-CM | POA: Diagnosis not present

## 2018-08-05 DIAGNOSIS — Z23 Encounter for immunization: Secondary | ICD-10-CM | POA: Diagnosis not present

## 2018-08-05 DIAGNOSIS — E875 Hyperkalemia: Secondary | ICD-10-CM | POA: Diagnosis not present

## 2018-08-05 DIAGNOSIS — E7800 Pure hypercholesterolemia, unspecified: Secondary | ICD-10-CM | POA: Diagnosis not present

## 2019-03-18 DIAGNOSIS — Z23 Encounter for immunization: Secondary | ICD-10-CM | POA: Diagnosis not present

## 2019-07-22 DIAGNOSIS — Z23 Encounter for immunization: Secondary | ICD-10-CM | POA: Diagnosis not present

## 2019-08-17 DIAGNOSIS — J449 Chronic obstructive pulmonary disease, unspecified: Secondary | ICD-10-CM | POA: Diagnosis not present

## 2019-08-17 DIAGNOSIS — Z0000 Encounter for general adult medical examination without abnormal findings: Secondary | ICD-10-CM | POA: Diagnosis not present

## 2019-08-17 DIAGNOSIS — E7800 Pure hypercholesterolemia, unspecified: Secondary | ICD-10-CM | POA: Diagnosis not present

## 2019-08-17 DIAGNOSIS — F17200 Nicotine dependence, unspecified, uncomplicated: Secondary | ICD-10-CM | POA: Diagnosis not present

## 2019-08-17 DIAGNOSIS — E039 Hypothyroidism, unspecified: Secondary | ICD-10-CM | POA: Diagnosis not present

## 2019-08-17 DIAGNOSIS — E875 Hyperkalemia: Secondary | ICD-10-CM | POA: Diagnosis not present

## 2019-08-19 DIAGNOSIS — Z23 Encounter for immunization: Secondary | ICD-10-CM | POA: Diagnosis not present

## 2019-09-25 ENCOUNTER — Encounter (HOSPITAL_COMMUNITY): Payer: Self-pay

## 2019-09-25 ENCOUNTER — Emergency Department (HOSPITAL_COMMUNITY)
Admission: EM | Admit: 2019-09-25 | Discharge: 2019-09-25 | Disposition: A | Discharge status: Home / Self Care | Payer: Medicare Other | Attending: Emergency Medicine | Admitting: Emergency Medicine

## 2019-09-25 ENCOUNTER — Emergency Department (HOSPITAL_COMMUNITY): Payer: Medicare Other

## 2019-09-25 ENCOUNTER — Other Ambulatory Visit: Payer: Self-pay

## 2019-09-25 DIAGNOSIS — M545 Low back pain: Secondary | ICD-10-CM | POA: Diagnosis not present

## 2019-09-25 DIAGNOSIS — M25551 Pain in right hip: Secondary | ICD-10-CM | POA: Diagnosis not present

## 2019-09-25 DIAGNOSIS — R0902 Hypoxemia: Secondary | ICD-10-CM | POA: Diagnosis not present

## 2019-09-25 DIAGNOSIS — R5381 Other malaise: Secondary | ICD-10-CM | POA: Diagnosis not present

## 2019-09-25 DIAGNOSIS — R52 Pain, unspecified: Secondary | ICD-10-CM | POA: Diagnosis not present

## 2019-09-25 DIAGNOSIS — M2550 Pain in unspecified joint: Secondary | ICD-10-CM | POA: Diagnosis not present

## 2019-09-25 DIAGNOSIS — M5441 Lumbago with sciatica, right side: Secondary | ICD-10-CM | POA: Diagnosis not present

## 2019-09-25 DIAGNOSIS — M5431 Sciatica, right side: Secondary | ICD-10-CM

## 2019-09-25 DIAGNOSIS — F17210 Nicotine dependence, cigarettes, uncomplicated: Secondary | ICD-10-CM | POA: Insufficient documentation

## 2019-09-25 DIAGNOSIS — N3001 Acute cystitis with hematuria: Secondary | ICD-10-CM | POA: Diagnosis not present

## 2019-09-25 DIAGNOSIS — Z7401 Bed confinement status: Secondary | ICD-10-CM | POA: Diagnosis not present

## 2019-09-25 DIAGNOSIS — M47816 Spondylosis without myelopathy or radiculopathy, lumbar region: Secondary | ICD-10-CM | POA: Diagnosis not present

## 2019-09-25 DIAGNOSIS — W19XXXA Unspecified fall, initial encounter: Secondary | ICD-10-CM | POA: Diagnosis not present

## 2019-09-25 LAB — URINALYSIS, ROUTINE W REFLEX MICROSCOPIC
Bilirubin Urine: NEGATIVE
Glucose, UA: NEGATIVE mg/dL
Ketones, ur: NEGATIVE mg/dL
Nitrite: NEGATIVE
Protein, ur: NEGATIVE mg/dL
Specific Gravity, Urine: 1.005 (ref 1.005–1.030)
pH: 6 (ref 5.0–8.0)

## 2019-09-25 MED ORDER — LIDOCAINE 5 % EX PTCH
1.0000 | MEDICATED_PATCH | CUTANEOUS | Status: DC
  Administered 2019-09-25: 08:00:00 1 via TRANSDERMAL
  Filled 2019-09-25: qty 1

## 2019-09-25 MED ORDER — KETOROLAC TROMETHAMINE 30 MG/ML IJ SOLN
30.0000 mg | Freq: Once | INTRAMUSCULAR | Status: AC
  Administered 2019-09-25: 30 mg via INTRAMUSCULAR
  Filled 2019-09-25: qty 1

## 2019-09-25 MED ORDER — IBUPROFEN 400 MG PO TABS: 400.0000 mg | ORAL_TABLET | Freq: Four times a day (QID) | ORAL | 0 refills | Status: AC | PRN

## 2019-09-25 MED ORDER — ONDANSETRON HCL 4 MG PO TABS
4.0000 mg | ORAL_TABLET | Freq: Four times a day (QID) | ORAL | 0 refills | Status: DC
Start: 2019-09-25 — End: 2023-10-27

## 2019-09-25 MED ORDER — LIDOCAINE 5 % EX PTCH: 1.0000 | MEDICATED_PATCH | CUTANEOUS | 0 refills | Status: DC

## 2019-09-25 MED ORDER — NITROFURANTOIN MONOHYD MACRO 100 MG PO CAPS
100.0000 mg | ORAL_CAPSULE | Freq: Two times a day (BID) | ORAL | 0 refills | Status: DC
Start: 2019-09-25 — End: 2023-10-27

## 2019-09-25 NOTE — ED Provider Notes (Signed)
[facility]-EMERGENCY DEPT Provider Note   CSN: [id number] Arrival date & time: 09/25/19  3300     History Chief Complaint  Patient presents with  . Right Hip Pain    Kathryn Cuevas is a 86 y.o. female.  HPI   86 year old female with a history of hyperkalemia, hyperlipidemia, osteoarthritis of the hips s/p hip replacements bilaterally, who presents the emergency department today for evaluation of right hip/buttock pain.  States that she has had pain here intermittently over the last several weeks.  Pain worsened last night and woke her up from sleep.  It is currently rated as severe.  It radiates around to the right upper part of the leg.  It has been constant since she woke up today.  She denies any recent falls or trauma.  She denies any numbness/weakness to the bilateral lower extremities.  She has been ambulatory.  She denies any chest pain, shortness breath, abdominal pain or dysuria.  She does state that she had 2 episodes of incontinence today.  States she was able to feel that she needed to use the restroom.  Past Medical History:  Diagnosis Date  . History of hyperkalemia    Sporadic episodes leading to cramping etc.  . Hyperlipidemia   . Ischemic optic neuropathy of right eye 10/2016  . Osteoarthritis of both hips    As well as lumbar spine  . Thyroid disease     Patient Active Problem List   Diagnosis Date Noted  . Amaurosis fugax of right eye 09/22/2017    Past Surgical History:  Procedure Laterality Date  . ANTERIOR FUSION LUMBAR SPINE    . TOTAL ABDOMINAL HYSTERECTOMY     With BSO  . TOTAL HIP ARTHROPLASTY Bilateral      OB History   No obstetric history on file.     Family History  Problem Relation Age of Onset  . Heart disease Mother        Congenital  . Heart attack Father        Died at age 20 -was a long-term smoker with poor diet>     Social History   Tobacco Use  . Smoking status: Current Every Day Smoker    Packs/day:  1.00    Years: 62.00    Pack years: 62.00    Types: Cigarettes  . Smokeless tobacco: Never Used  Substance Use Topics  . Alcohol use: Not Currently  . Drug use: Not Currently    Home Medications Prior to Admission medications   Medication Sig Start Date End Date Taking? Authorizing Provider  ibuprofen (ADVIL) 400 MG tablet Take 1 tablet (400 mg total) by mouth every 6 (six) hours as needed for up to 5 days. 09/25/19 09/30/19  Rasaan Brotherton S, PA-C  levothyroxine (SYNTHROID, LEVOTHROID) 100 MCG tablet Take 100 mcg by mouth daily before breakfast.    [provider]  nitrofurantoin, macrocrystal-monohydrate, (MACROBID) 100 MG capsule Take 1 capsule (100 mg total) by mouth 2 (two) times daily. 09/25/19   Draven Natter S, PA-C    Allergies    Augmentin [amoxicillin-pot clavulanate] and Sulfa antibiotics  Review of Systems   Review of Systems  Constitutional: Negative for chills and fever.  HENT: Negative for ear pain and sore throat.   Eyes: Negative for visual disturbance.  Respiratory: Negative for cough and shortness of breath.   Cardiovascular: Negative for chest pain.  Gastrointestinal: Negative for abdominal pain, constipation, diarrhea, nausea and vomiting.  Genitourinary: Negative for dysuria  and hematuria.       Urinary incontinence  Musculoskeletal: Positive for back pain (right hip/buttock).  Skin: Negative for rash.  Neurological: Negative for weakness, numbness and headaches.  All other systems reviewed and are negative.   Physical Exam Updated Vital Signs BP 133/85   Pulse 66   Temp 98.1 F (36.7 C) (Oral)   Resp 18   SpO2 95%   Physical Exam Vitals and nursing note reviewed.  Constitutional:      General: She is not in acute distress.    Appearance: She is well-developed.  HENT:     Head: Normocephalic and atraumatic.  Eyes:     Conjunctiva/sclera: Conjunctivae normal.  Cardiovascular:     Rate and Rhythm: Normal rate and regular rhythm.      Heart sounds: No murmur.  Pulmonary:     Effort: Pulmonary effort is normal. No respiratory distress.     Breath sounds: Normal breath sounds. No wheezing, rhonchi or rales.  Abdominal:     General: Bowel sounds are normal.     Palpations: Abdomen is soft.     Tenderness: There is no abdominal tenderness.  Musculoskeletal:     Cervical back: Neck supple.     Comments: No point TTP to the lumbar spine. TTP to the the right sciatic notch and just caudal to this which reproduces her pain. No erythema, or swelling. No pain with ROM of the right hip. 5/5 strength to the BLE with normal sensation throughout. Ambulatory with steady gait.  Skin:    General: Skin is warm and dry.  Neurological:     Mental Status: She is alert.     ED Results / Procedures / Treatments   Labs (all labs ordered are listed, but only abnormal results are displayed) Labs Reviewed  URINALYSIS, ROUTINE W REFLEX MICROSCOPIC - Abnormal; Notable for the following components:      Result Value   Hgb urine dipstick SMALL (*)    Leukocytes,Ua MODERATE (*)    Bacteria, UA RARE (*)    All other components within normal limits  URINE CULTURE    EKG None  Radiology DG Lumbar Spine Complete  Result Date: 09/25/2019 CLINICAL DATA:  Right hip pain, history of back surgery EXAM: LUMBAR SPINE - COMPLETE 4+ VIEW COMPARISON:  None. FINDINGS: There are postoperative changes of fusion at L4-S1 with rods and pedicle screws and interbody spacers. There is degenerative disc disease above the fusion level with disc height loss, vacuum phenomenon, and endplate osteophytes. Mild facet hypertrophy is present. No vertebral body compression deformity. IMPRESSION: Fusion at L4-S1.  Degenerative changes at L3-L4. Electronically Signed   By: Guadlupe Spanish M.D.   On: 09/25/2019 08:55   MR LUMBAR SPINE WO CONTRAST  Result Date: 09/25/2019 CLINICAL DATA:  Low back pain. New onset of urinary incontinence. Right buttock and leg pain. EXAM: MRI  LUMBAR SPINE WITHOUT CONTRAST TECHNIQUE: Multiplanar, multisequence MR imaging of the lumbar spine was performed. No intravenous contrast was administered. COMPARISON:  09/25/2019 radiographs FINDINGS: Segmentation: The lowest lumbar type non-rib-bearing vertebra is labeled as L5. Alignment:  3 mm degenerative retrolisthesis at L3-4. Vertebrae: Posterolateral rod and pedicle screw fixation at L4-L5-S1 bilaterally. Interbody fusion at L4-5 and L5-S1. Facet and perifacet edema on the left at L2-3 and to a lesser extent at L3-4. Type 2 degenerative endplate findings at L3-4. Conus medullaris and cauda equina: Conus extends to the L1 level. Conus and cauda equina appear normal. No clumping of nerve roots to suggest  arachnoiditis. Paraspinal and other soft tissues: Sigmoid colon diverticulosis. Lower paraspinal muscular atrophy. Disc levels: T12-L1: No impingement.  Left paracentral disc protrusion. L1-2: No impingement. Disc bulge with small central disc protrusion. L2-3: Moderate central narrowing of the thecal sac with mild bilateral foraminal stenosis and mild bilateral subarticular lateral recess stenosis due to disc bulge and left greater than right facet arthropathy. L3-4: Mild central narrowing of the thecal sac mild displacement of the left L3 nerve in the lateral extraforaminal space as well as borderline bilateral subarticular lateral recess stenosis due to disc osteophyte complex and facet arthropathy. L4-5: No impingement.  This level is fused. L5-S1: No impingement.  This level is fused. IMPRESSION: 1. No findings of arachnoiditis. 2. Lumbar spondylosis and degenerative disc disease causing moderate impingement at L2-3 and mild impingement at L3-4. 3. Sigmoid colon diverticulosis. Electronically Signed   By: Gaylyn Rong M.D.   On: 09/25/2019 11:03   DG Hip Unilat W or Wo Pelvis 2-3 Views Right  Result Date: 09/25/2019 CLINICAL DATA:  Right hip pain EXAM: DG HIP (WITH OR WITHOUT PELVIS) 2-3V RIGHT  COMPARISON:  None. FINDINGS: Right total hip arthroplasty. Alignment is anatomic. There is no fracture or evidence of hardware complication. Partially imaged left total hip arthroplasty. Partially imaged lumbar spine fusion. Degenerative changes at the sacroiliac joints. IMPRESSION: No acute abnormality. Electronically Signed   By: Guadlupe Spanish M.D.   On: 09/25/2019 08:53    Procedures Procedures (including critical care time)  Medications Ordered in ED Medications  lidocaine (LIDODERM) 5 % 1 patch (1 patch Transdermal Patch Applied 09/25/19 0751)  ketorolac (TORADOL) 30 MG/ML injection 30 mg (30 mg Intramuscular Given 09/25/19 0751)    ED Course  I have reviewed the triage vital signs and the nursing notes.  Pertinent labs & imaging results that were available during my care of the patient were reviewed by me and considered in my medical decision making (see chart for details).  Clinical Course as of Sep 24 1124  Wynelle Link Sep 25, 2019  0820 This is an 63 female reports a history of lumbar spinal fusion, presenting to the emergency department with 2 weeks of insidious onset right lower hip pain.  She denies any falls or trauma.  She is not on blood thinners.  She reports she has pain around her right ilium that is worse with walking, better with sitting.  She has not had this kind of pain before.  It has been on and off for 2 weeks.  She does live by herself but has been able to live independently prior to this.  She is still able to walk.  She reports that last night when she tried to stand up she had a large episode of urinary incontinence, "I couldn't feel the urine coming out."  She denies any dysuria or UTI symptoms.  She denies fevers chills.  She denies nausea vomiting or diarrhea.  On exam she overall appears comfortable.  She is not in acute distress.  She does have tenderness of the right ilium on palpation.  She has good range of motion at bilateral hips.  She is neurovascularly intact.  No  saddle anesthesia.  Of note, she does report bilateral prosethetic hips.  Plan for xrays of the hip and UA.  Suspect we may need an MRI to evaluate her spinal cord with her episode of incontinence, although she continues to have good strength and sensation on exam.   [MT]    Clinical Course User Index [  MT] Wyvonnia Dusky, MD   MDM Rules/Calculators/A&P                      86 year old female presenting for evaluation of right lower back/buttock pain that has been ongoing intermittently for several weeks but worsened last night and woke her up from sleep.  She also stated that she had 2 episodes of incontinence at home.  She denies other urinary symptoms.  She denies other systemic symptoms or other neurologic deficits.  On exam she is neurologically intact.    Initial work-up with UA and x-rays completed UA with hematuria, moderate leukocytes, 0-5 RBCs, 11-20 WBCs and rare bacteria.  Will send culture and tx given pts urinary incontinence.  X-ray hips with pelvis did not show any acute abnormality X-ray of the lumbar spine with fusion at L4 S1 and degenerative changes at L3-4  MRI of the spine was completed due to patient's reports of urinary incontinence which did not show any evidence of cauda equina.  She did have lumbar spondylosis and degenerative disc disease causing some moderate impingement at L2-3 and mild impingement at L3-4.  She is ambulatory with no acute distress.  She was given Toradol and a lidocaine patch and felt improved following this the pain was not completely resolved.  I think that she will likely need a few more days of anti-inflammatory medication.  Will give Rx for anti-inflammatories and Lidoderm patches.  We will also give Rx for antibiotics due to concern for a possible urinary tract infection.  We will have her follow-up with her PCP and return to the ED for new or worsening symptoms.  She voiced understanding plan reasons to return. all Questions answered.   Patient stable for discharge.   Final Clinical Impression(s) / ED Diagnoses Final diagnoses:  Sciatica of right side  Acute cystitis with hematuria    Rx / DC Orders ED Discharge Orders         Ordered    nitrofurantoin, macrocrystal-monohydrate, (MACROBID) 100 MG capsule  2 times daily     09/25/19 1126    ibuprofen (ADVIL) 400 MG tablet  Every 6 hours PRN     09/25/19 [address], Turki Tapanes S, PA-C 09/25/19 1126    Wyvonnia Dusky, MD 09/25/19 1754

## 2019-09-25 NOTE — ED Notes (Signed)
PTAR at bedside 

## 2019-09-25 NOTE — ED Notes (Signed)
An After Visit Summary was printed and given to the patient. Discharge instructions given and no further questions at this time. PTAR called 

## 2019-09-25 NOTE — ED Triage Notes (Signed)
Pt BIB EMS from home. Pt c/o right hip pain, hx of right hip surgery. Pain has been ongoing, pt denies falls. Pt denies taking blood thinners. Pt ambulatory at baseline, pt currently able to walk with pain.

## 2019-09-25 NOTE — Discharge Instructions (Signed)
You were given a prescription for antibiotics. Please take the antibiotic prescription fully.  ? ?You may alternate taking Tylenol and Ibuprofen as needed for pain control. You may take 400-600 mg of ibuprofen every 6 hours and 500-1000 mg of Tylenol every 6 hours. Do not exceed 4000 mg of Tylenol daily as this can lead to liver damage. Also, make sure to take Ibuprofen with meals as it can cause an upset stomach. Do not take other NSAIDs while taking Ibuprofen such as (Aleve, Naprosyn, Aspirin, Celebrex, etc) and do not take more than the prescribed dose as this can lead to ulcers and bleeding in your GI tract. You may use warm and cold compresses to help with your symptoms.  ? ?Please follow up with your primary doctor within the next 7-10 days for re-evaluation and further treatment of your symptoms.  ? ?Please return to the ER sooner if you have any new or worsening symptoms. ? ?

## 2019-09-28 LAB — URINE CULTURE: Culture: >=100000 — AB

## 2019-09-29 ENCOUNTER — Telehealth: Payer: Self-pay

## 2019-09-29 NOTE — Telephone Encounter (Signed)
Post ED Visit - Positive Culture Follow-up  Culture report reviewed by antimicrobial stewardship pharmacist: [facility] []  , Pharm.D. []  Enzo Bi, Pharm.D., BCPS AQ-ID []  , Pharm.D., BCPS []  Celedonio Miyamoto, Pharm.D., BCPS []  [place], Garvin Fila.D., BCPS, AAHIVP []  , Pharm.D., BCPS, AAHIVP []  Georgina Pillion, PharmD, BCPS []  , PharmD, BCPS []  [place], PharmD, BCPS []  [address], PharmD []  , PharmD, BCPS []  Estella Husk, PharmD  Pharmacy Team []  Lysle Pearl, PharmD []  , PharmD []  Phillips Climes, PharmD []  , Rph []  Agapito Games) , PharmD []  Verlan Friends, PharmD []  , PharmD []  Mervyn Gay, PharmD []  , PharmD []  Vinnie Level, PharmD []  [facility], PharmD [x]  , PharmD []  Len Childs, PharmD   Positive urine culture Treated with Nitrofurantoin Monohyd Macro, organism sensitive to the same and no further patient follow-up is required at this time.  09/29/2019, 10:12 AM

## 2020-03-06 DIAGNOSIS — Z23 Encounter for immunization: Secondary | ICD-10-CM | POA: Diagnosis not present

## 2020-04-16 DIAGNOSIS — Z23 Encounter for immunization: Secondary | ICD-10-CM | POA: Diagnosis not present

## 2020-05-11 IMAGING — CR DG LUMBAR SPINE COMPLETE 4+V
5 series · 5 of 5 positions shown · non-contrast
Comparison: None.

CLINICAL DATA: Right hip pain, history of back surgery

EXAM:
LUMBAR SPINE - COMPLETE 4+ VIEW

[t lumbar spine ap]
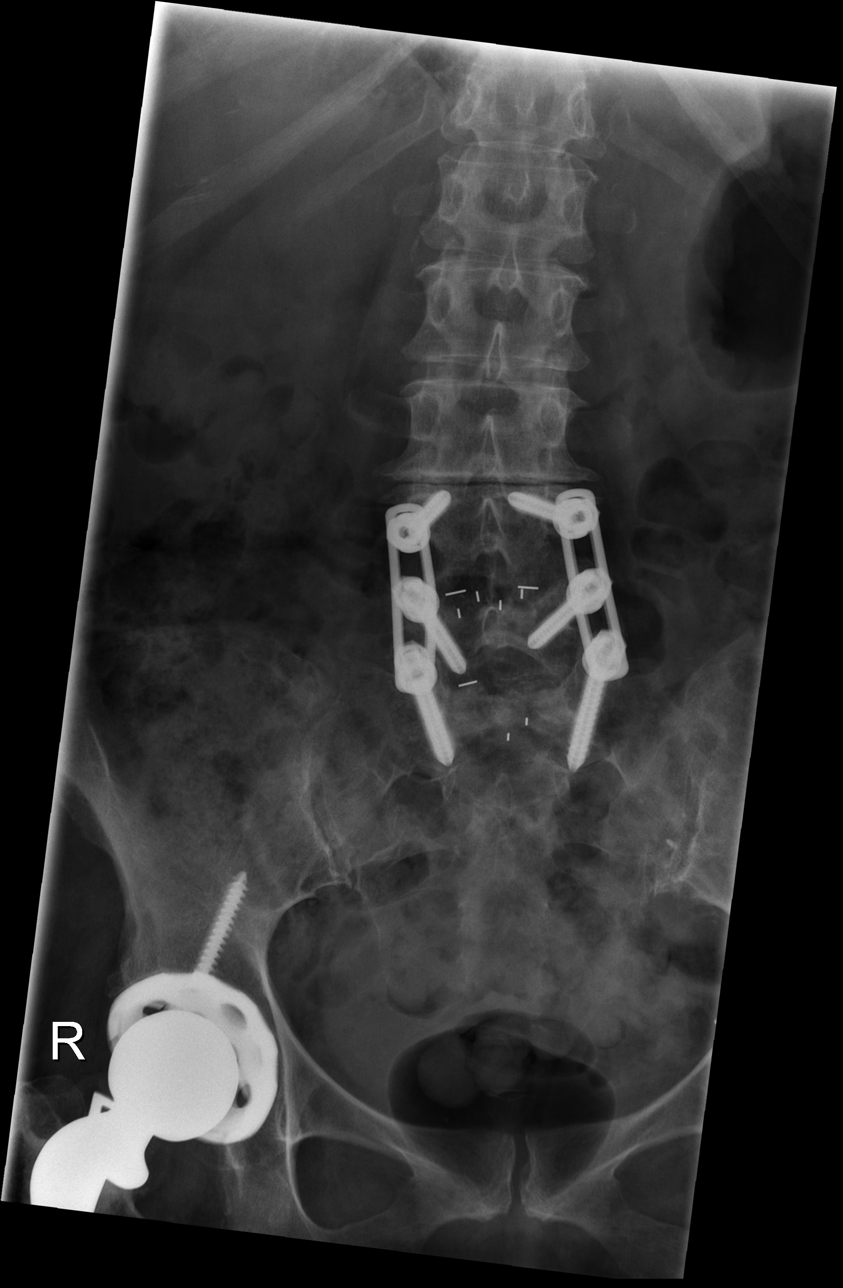

[t lumbar spine obl (1 of 2)]
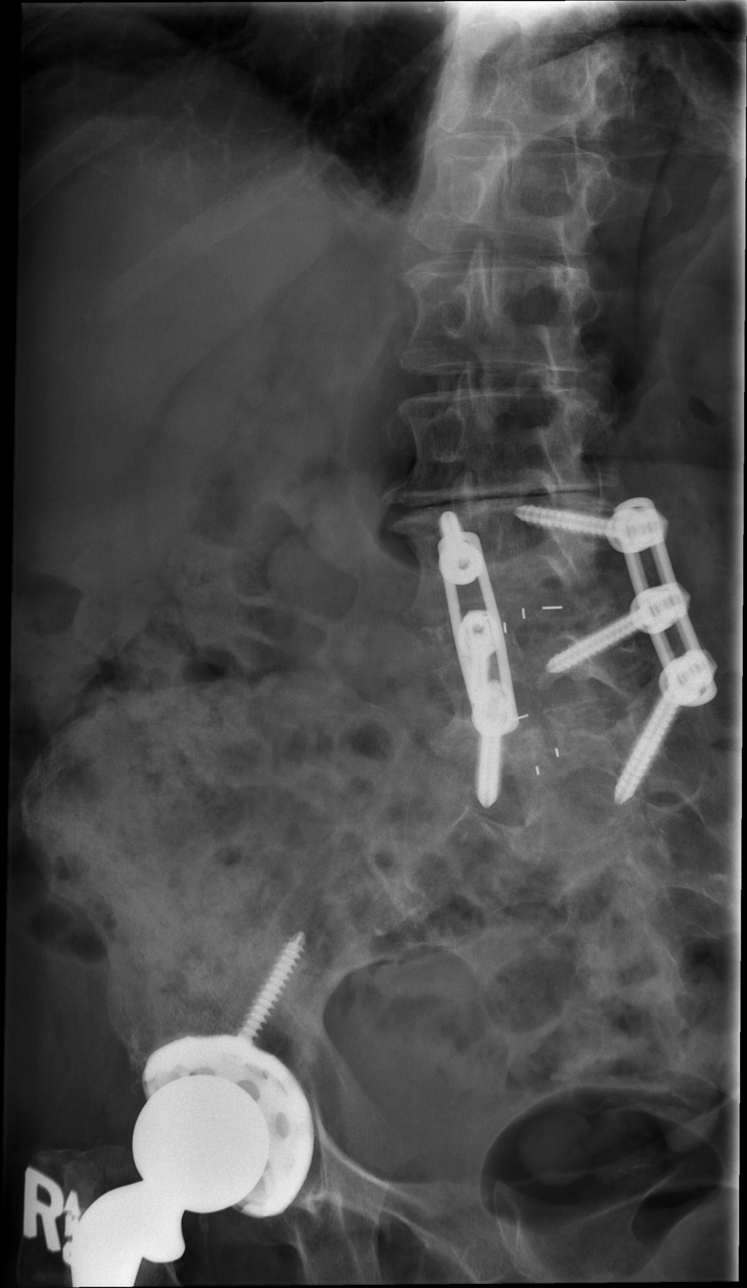

[t lumbar spine obl (2 of 2)]
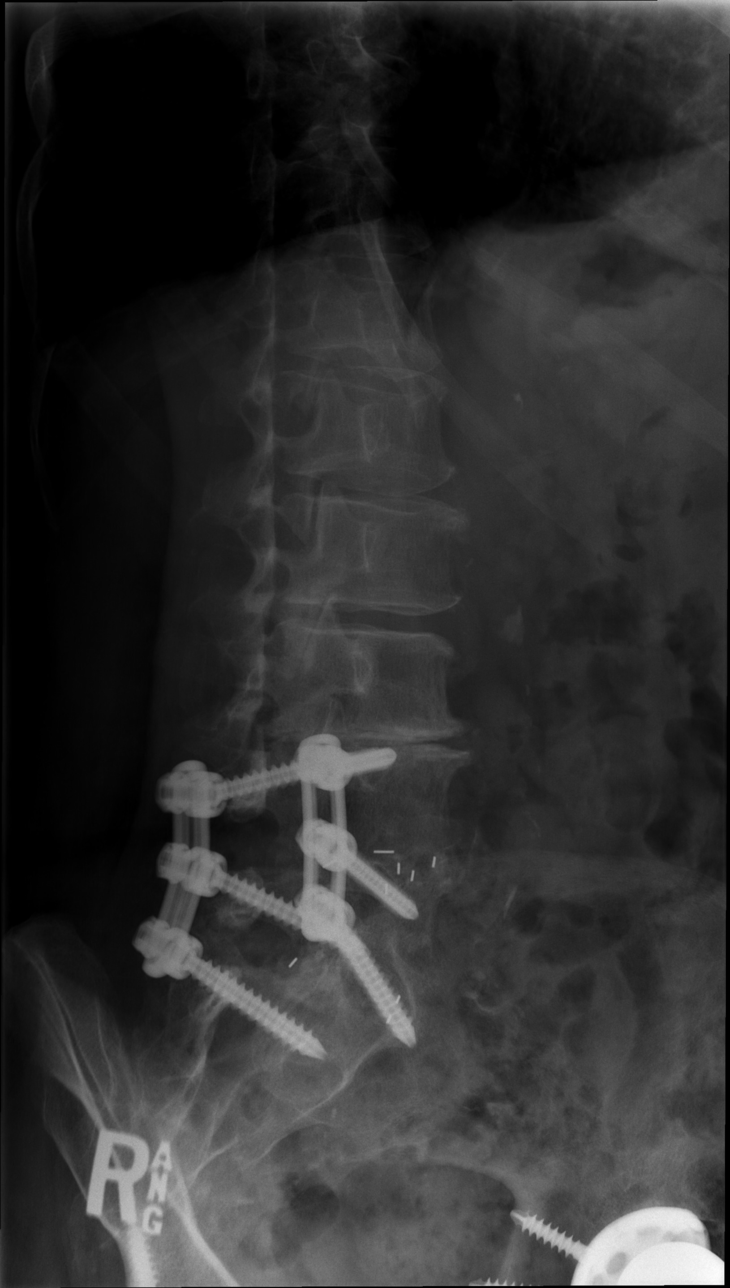

[t lumbar spine lat]
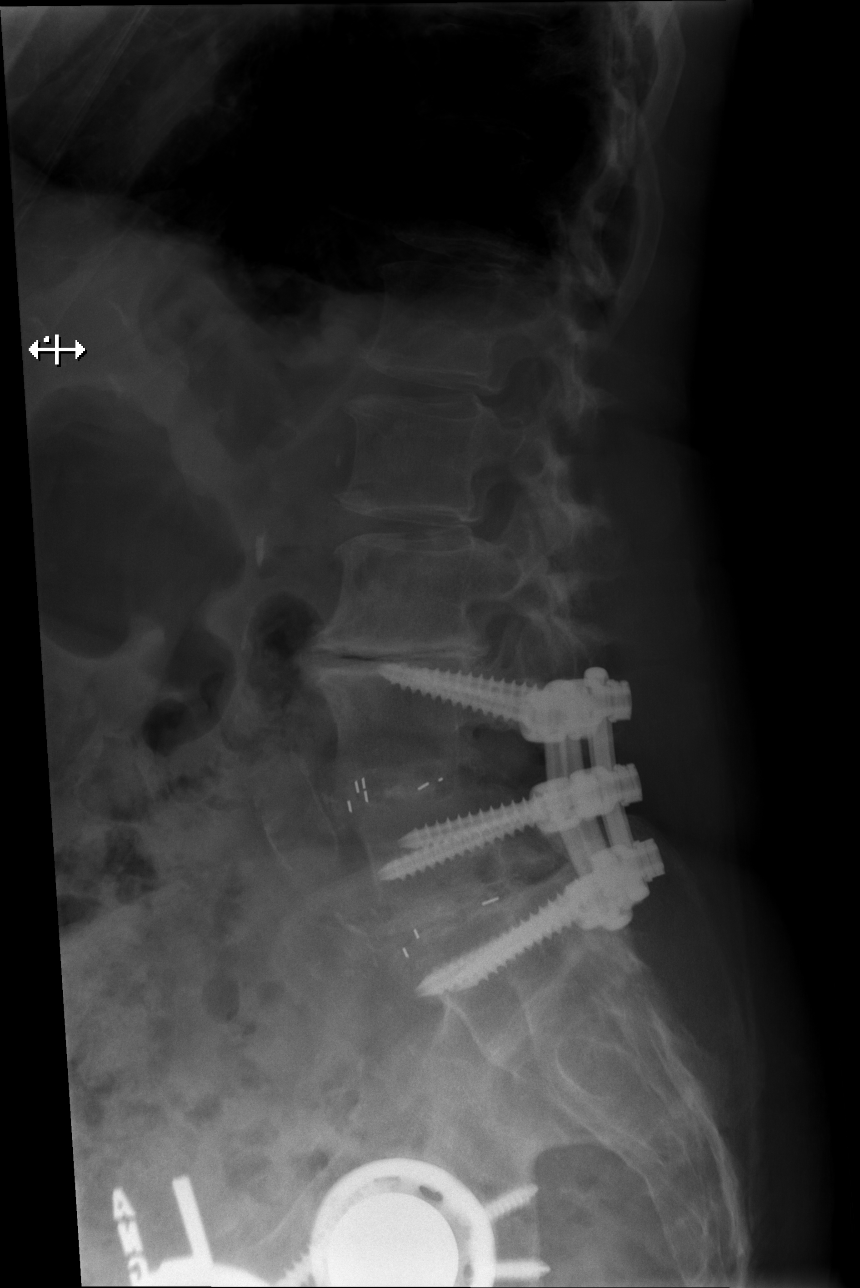

[t lumbar l-5 s-1 spot]
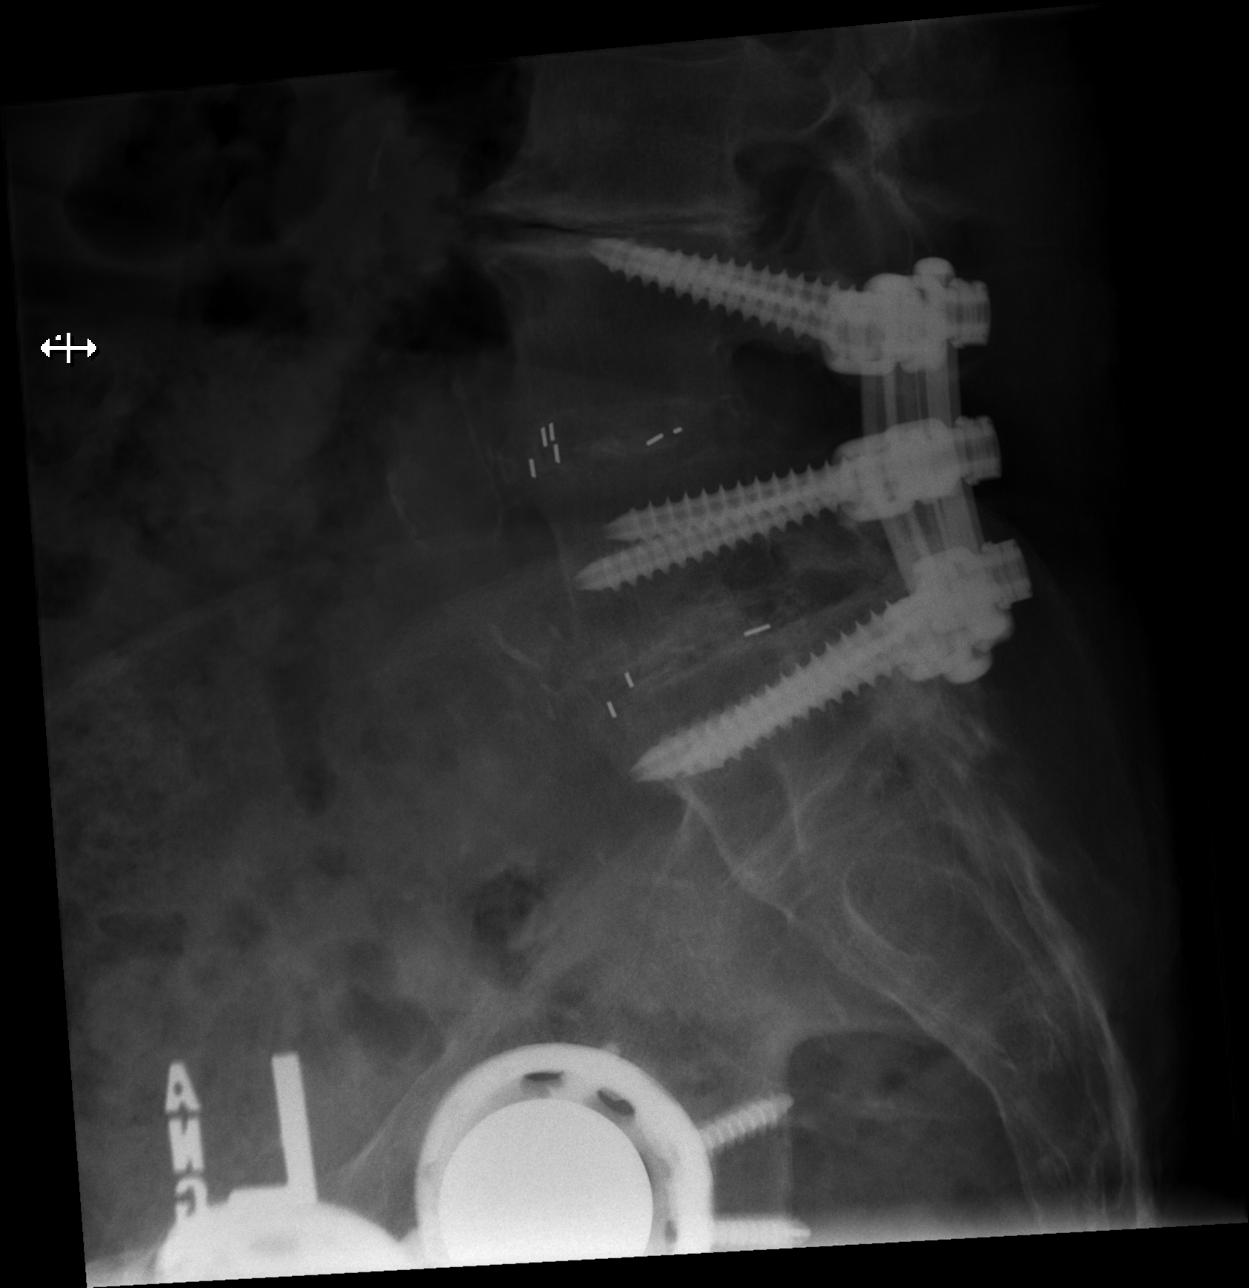

[5 of 5 positions shown; findings below may reference images not displayed]

FINDINGS: There are postoperative changes of fusion at L4-S1 with rods and
pedicle screws and interbody spacers. There is degenerative disc
disease above the fusion level with disc height loss, vacuum
phenomenon, and endplate osteophytes. Mild facet hypertrophy is
present. No vertebral body compression deformity.
IMPRESSION: Fusion at L4-S1.  Degenerative changes at L3-L4.

## 2020-05-11 IMAGING — CR DG HIP (WITH OR WITHOUT PELVIS) 2-3V*R*
3 series · 3 of 3 positions shown · non-contrast
Comparison: None.

CLINICAL DATA: Right hip pain

EXAM:
DG HIP (WITH OR WITHOUT PELVIS) 2-3V RIGHT

[t pelvis ap]
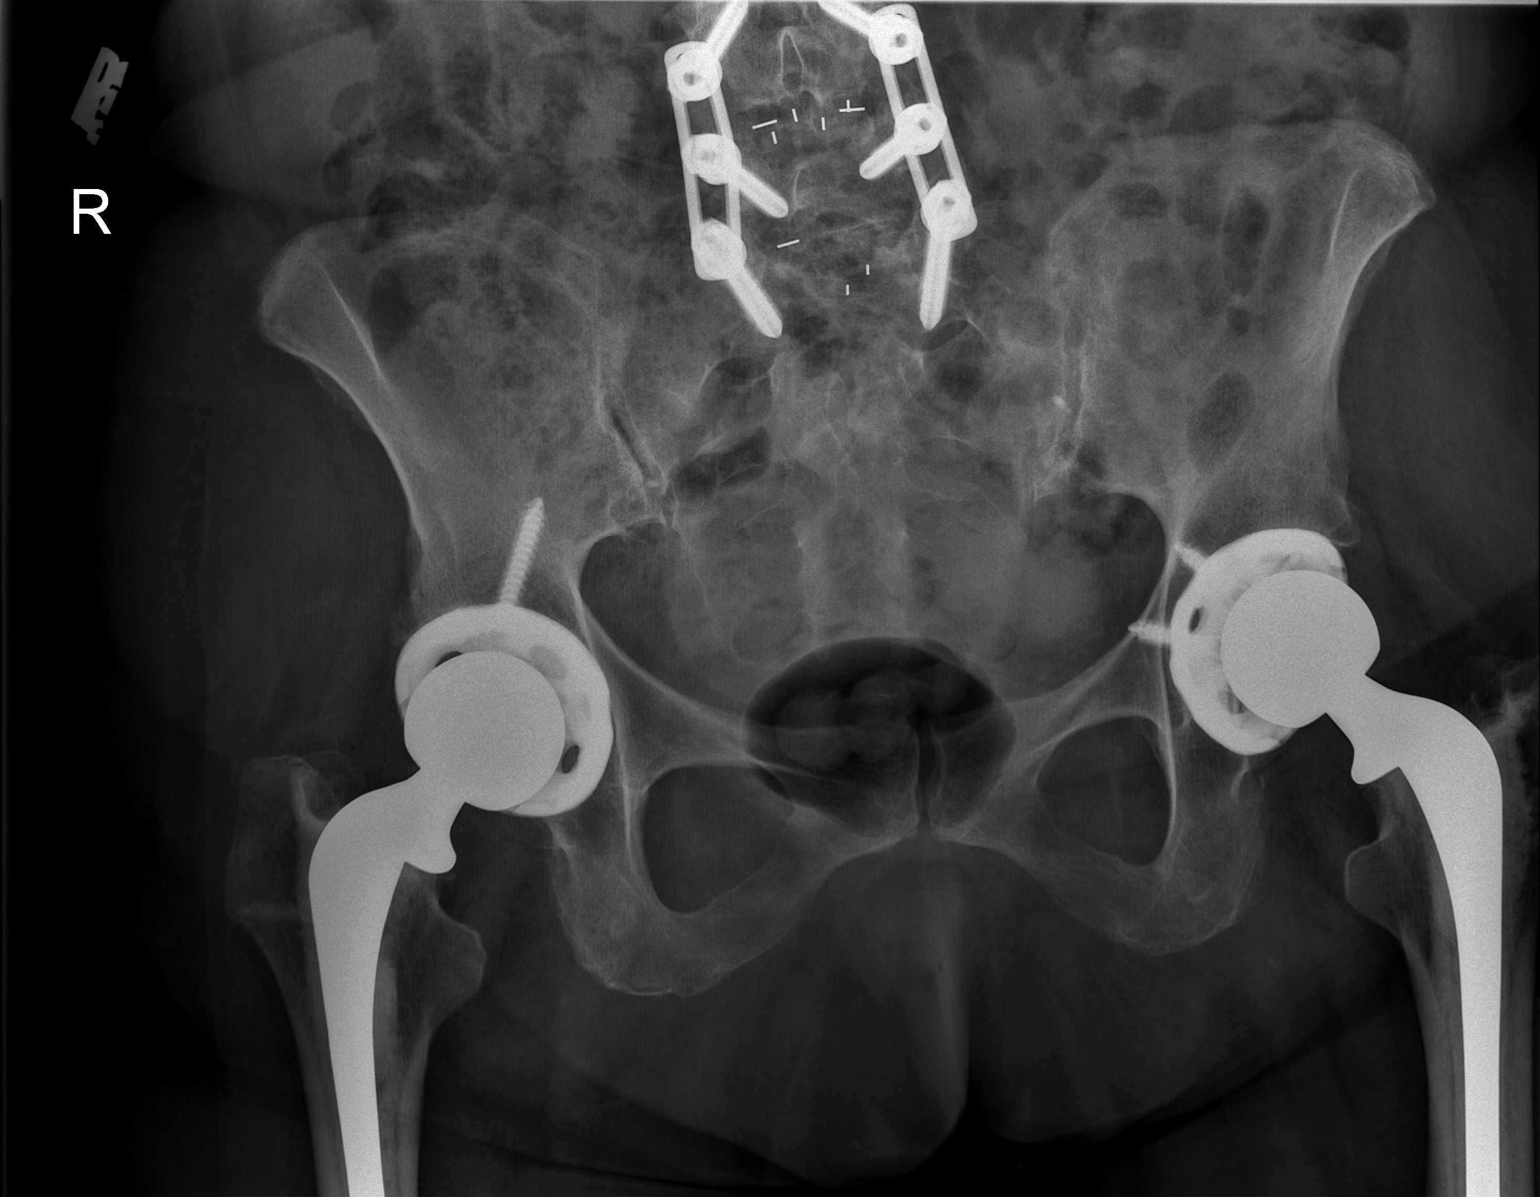

[t hip ap right]
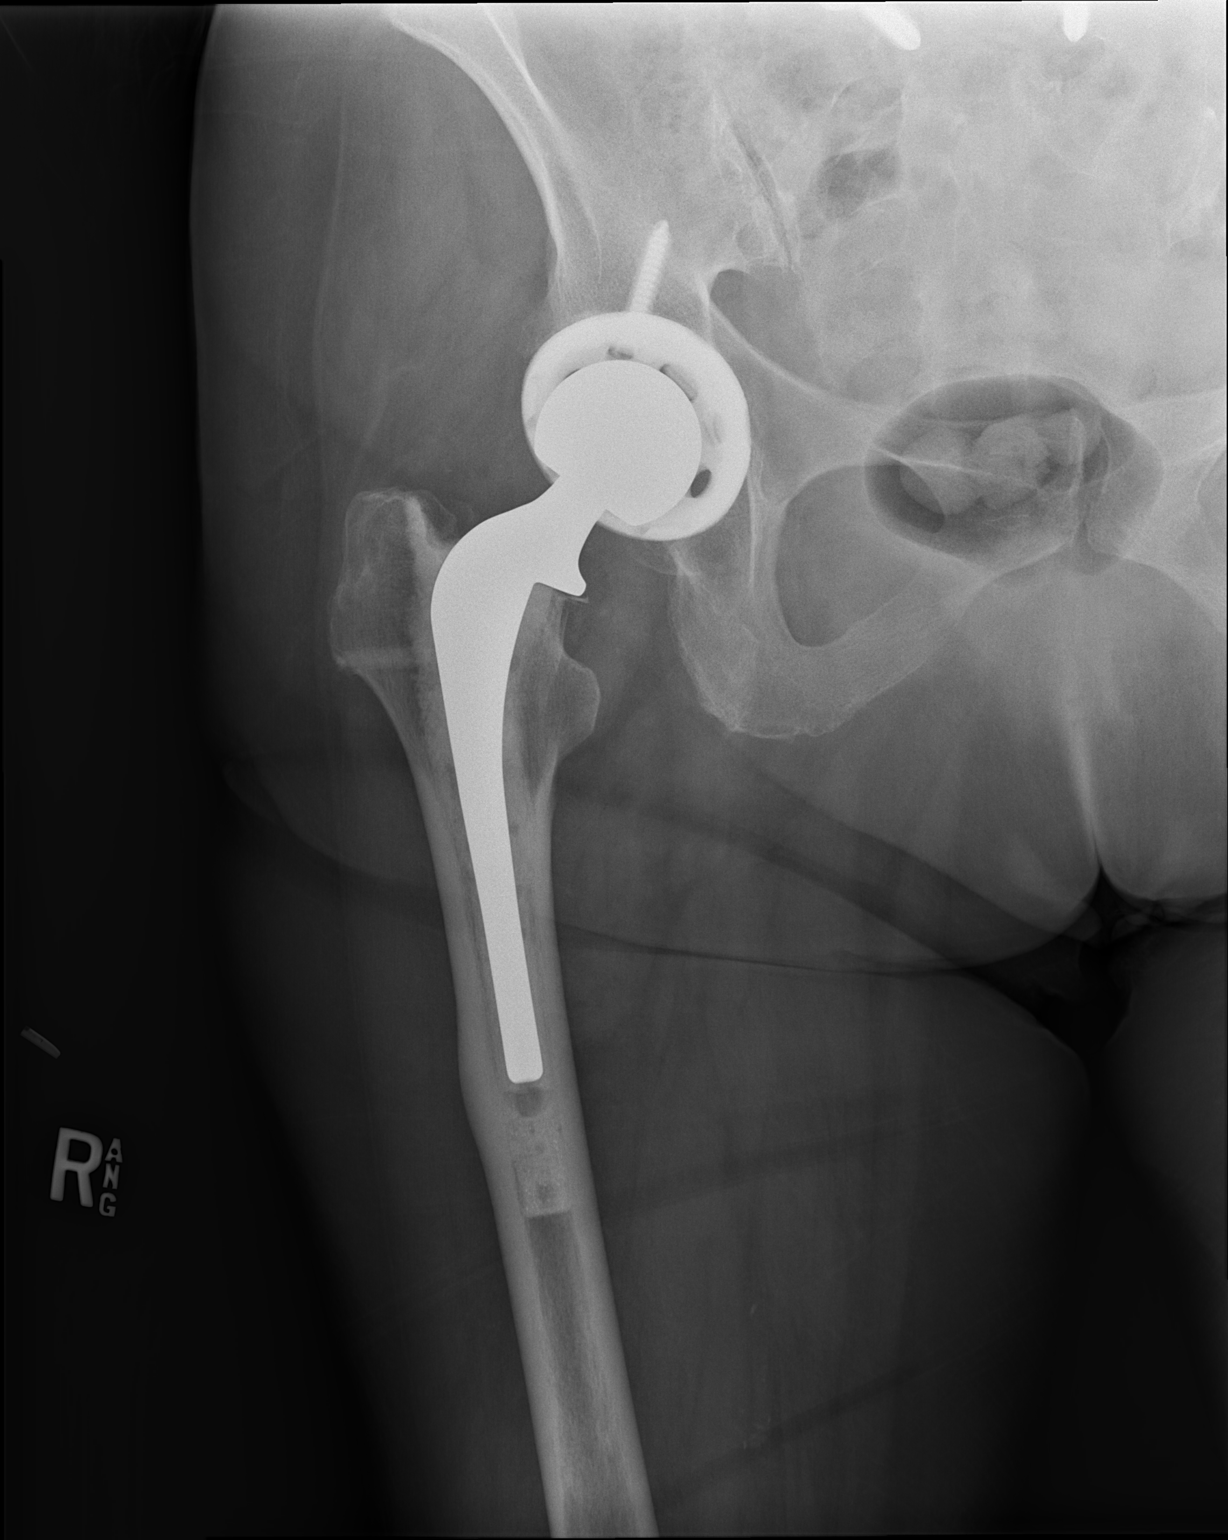

[t hip frog leg right]
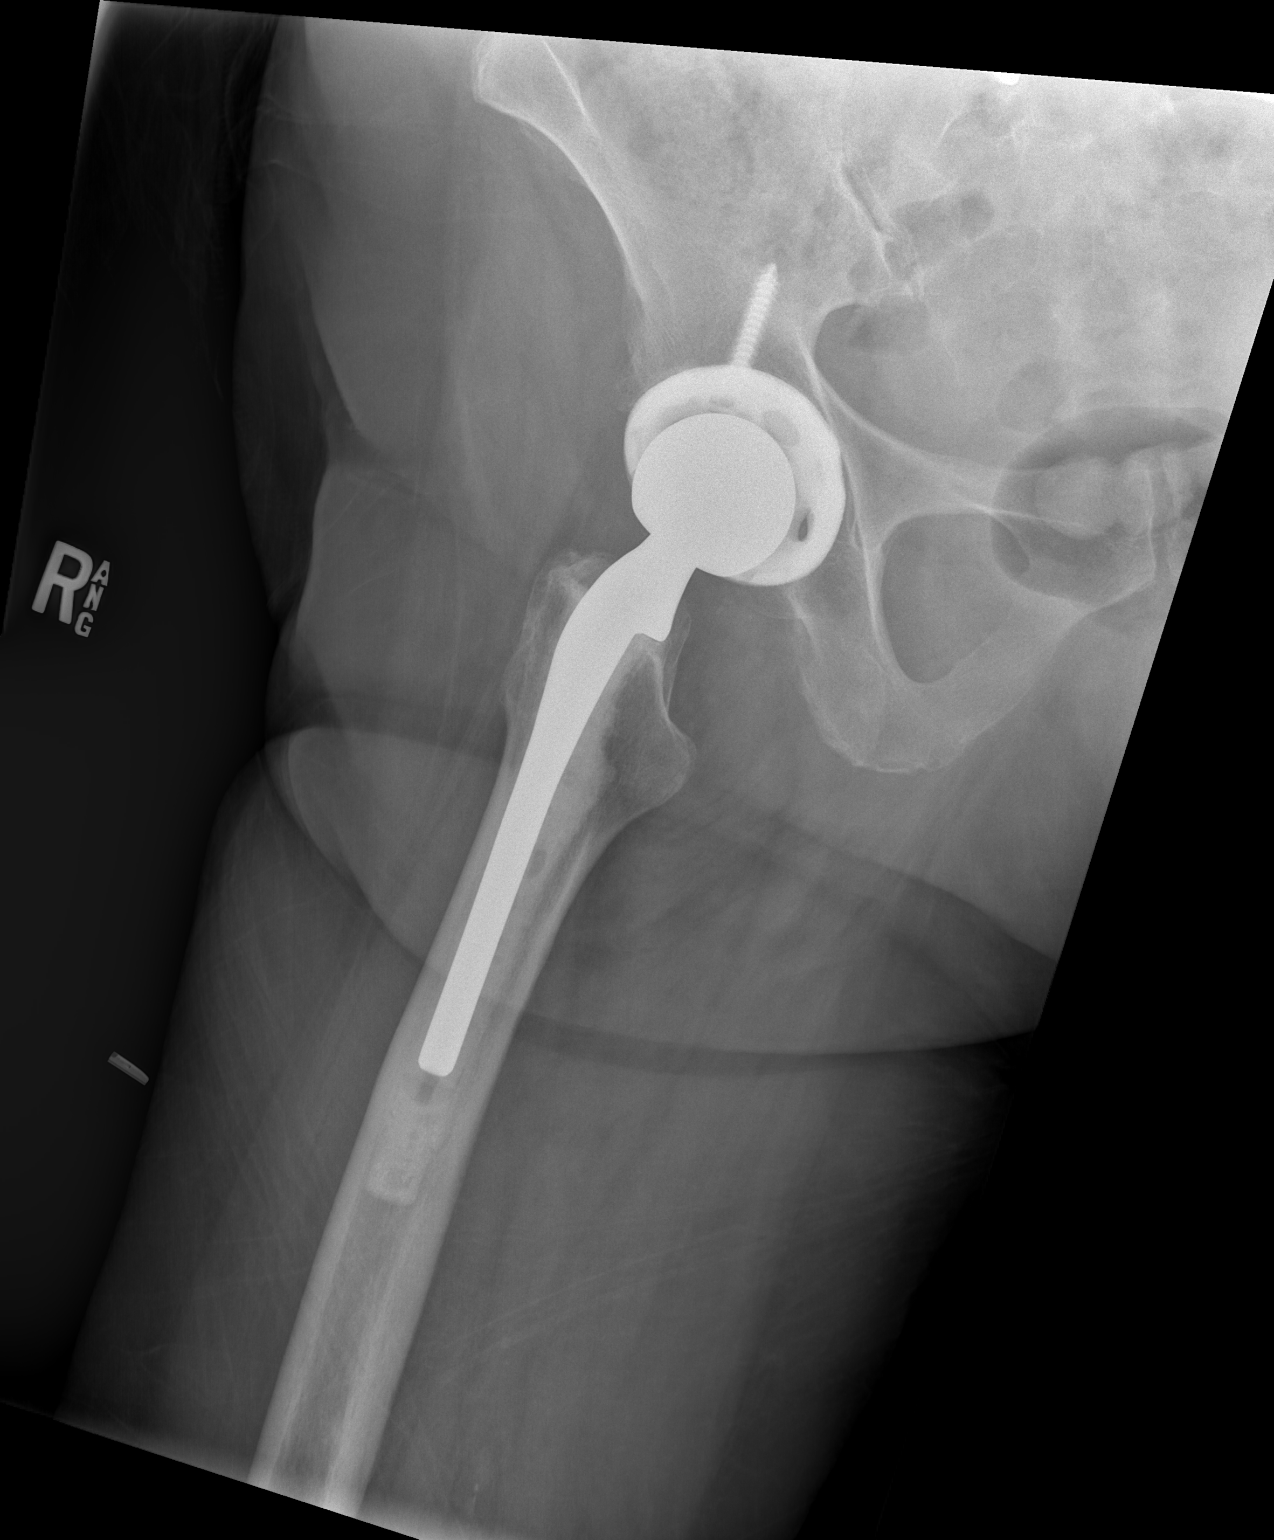

[3 of 3 positions shown; findings below may reference images not displayed]

FINDINGS: Right total hip arthroplasty. Alignment is anatomic. There is no
fracture or evidence of hardware complication. Partially imaged left
total hip arthroplasty. Partially imaged lumbar spine fusion.
Degenerative changes at the sacroiliac joints.
IMPRESSION: No acute abnormality.

## 2020-05-11 IMAGING — MR MR LUMBAR SPINE W/O CM
5 of 6 series · 23 of 48 positions shown · non-contrast
Comparison: 09/25/2019 radiographs

CLINICAL DATA: Low back pain. New onset of urinary incontinence.
Right buttock and leg pain.

EXAM:
MRI LUMBAR SPINE WITHOUT CONTRAST
TECHNIQUE: Multiplanar, multisequence MR imaging of the lumbar spine was
performed. No intravenous contrast was administered.

[Series 5: T1 · sagittal · 4.0mm · 0.81mm/px · 3 of 17 slices shown (1 of 2)]
[im 1/17]
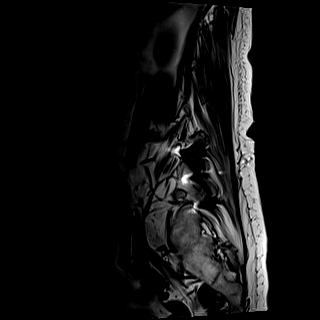
[im 9/17]
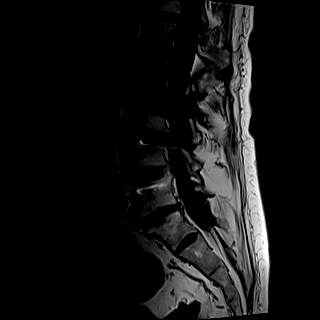
[im 17/17]
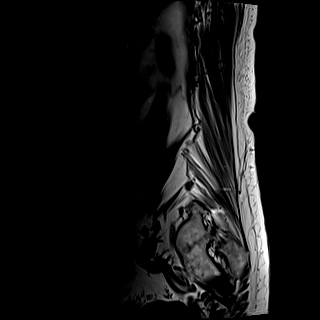

[Series 6: T2 · sagittal · 4.0mm · 0.81mm/px · 3 of 16 slices shown (1 of 2)]
[im 1/16]
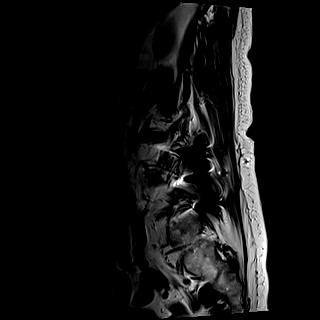
[im 8/16]
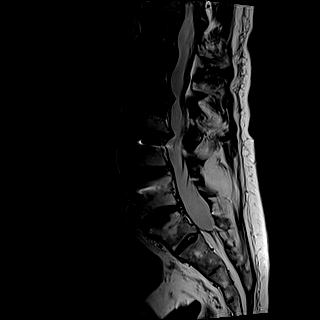
[im 16/16]
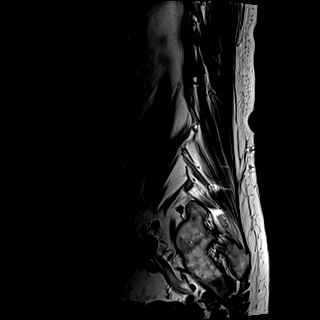

[Series 7: STIR · sagittal · 4.0mm · 0.51mm/px · 3 of 16 slices shown]
[im 1/16]
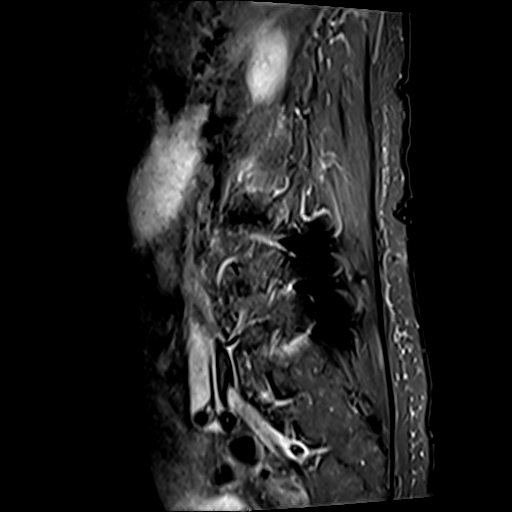
[im 8/16]
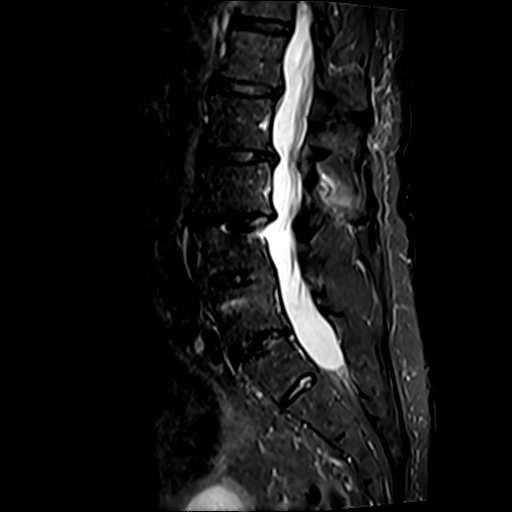
[im 16/16]
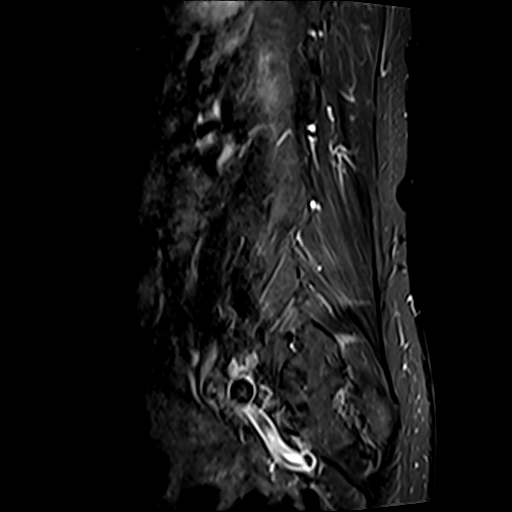

[Series 8: T2 · axial · 4.0mm · 0.62mm/px · z∈[-17,+205]mm · 7 of 40 slices shown (2 of 2)]
[im 1/40]
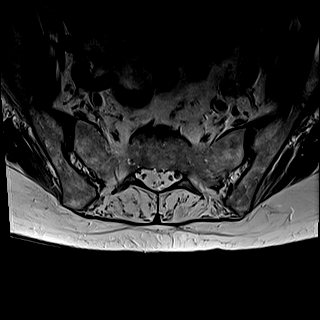
[im 7/40]
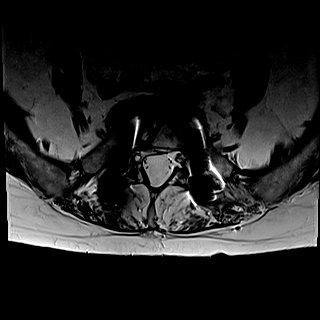
[im 14/40]
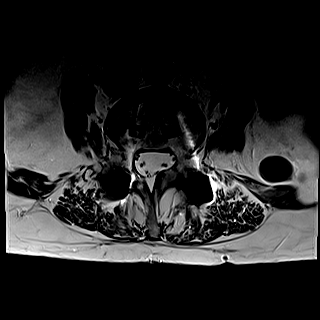
[im 20/40]
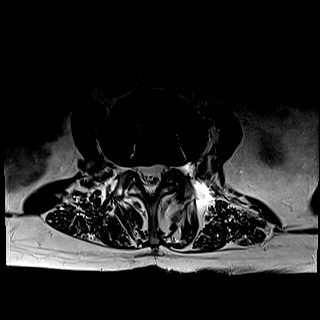
[im 27/40]
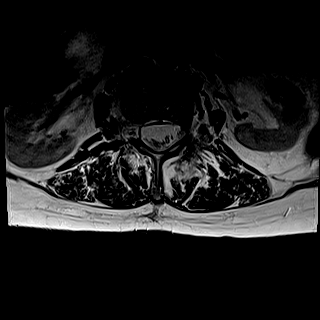
[im 33/40]
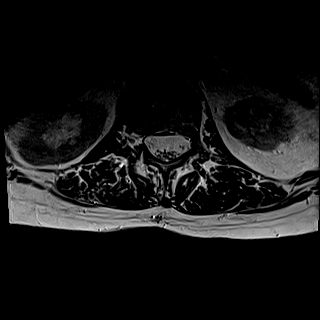
[im 40/40]
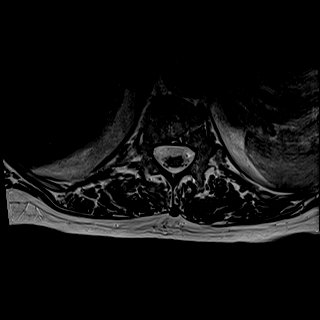

[Series 9: T1 · axial · 4.0mm · 0.43mm/px · z∈[-19,+204]mm · 7 of 40 slices shown (2 of 2)]
[im 1/40]
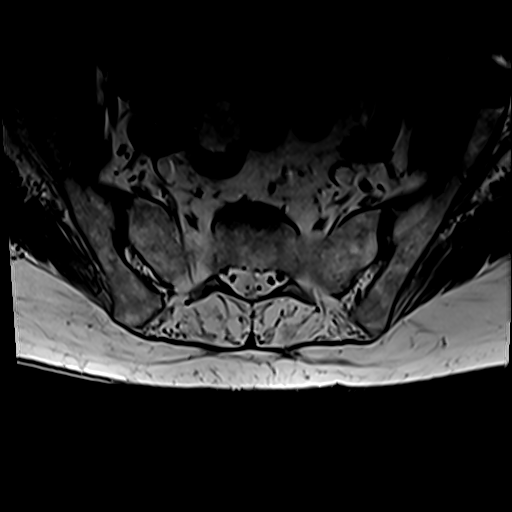
[im 7/40]
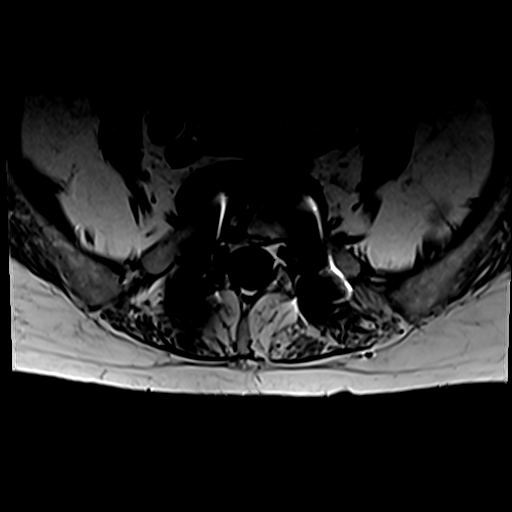
[im 14/40]
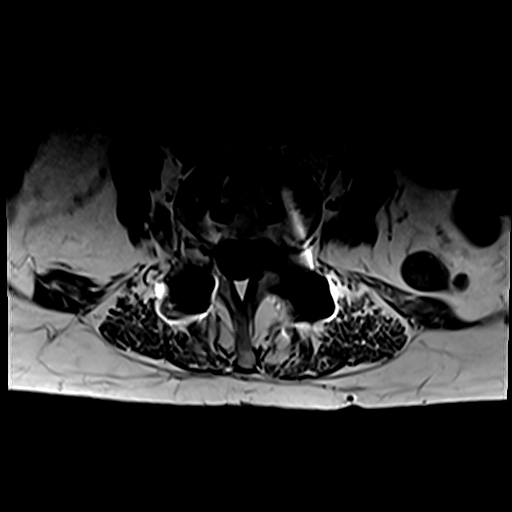
[im 20/40]
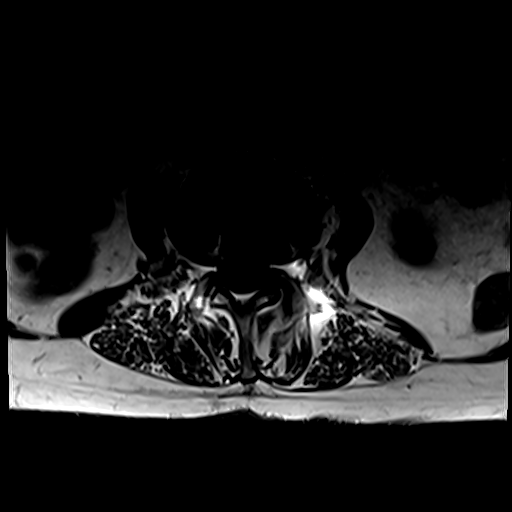
[im 27/40]
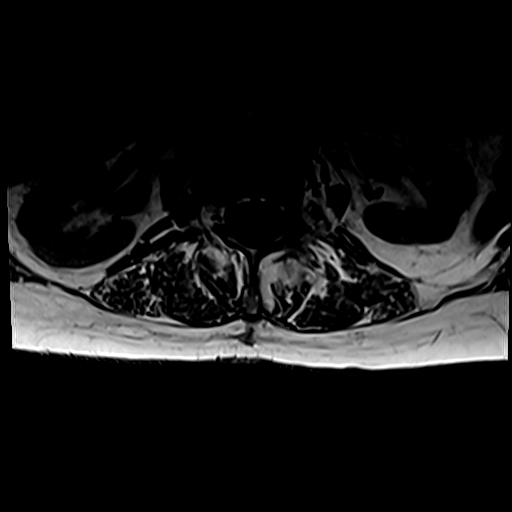
[im 33/40]
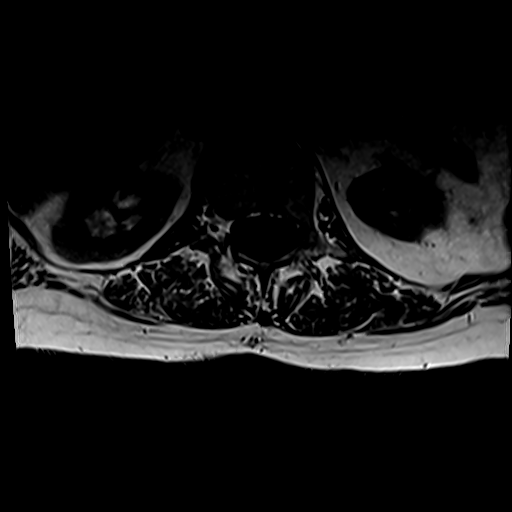
[im 40/40]
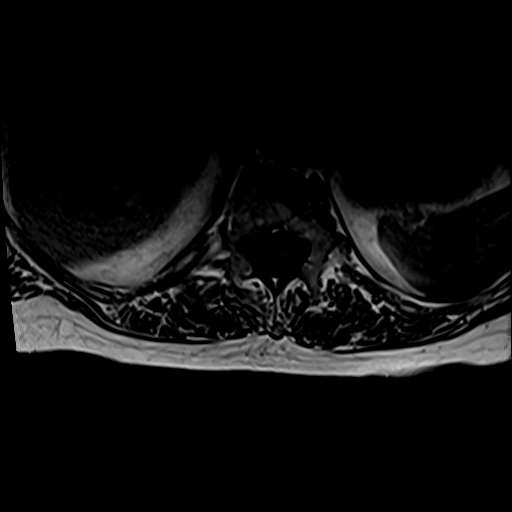

[23 of 48 positions shown; findings below may reference images not displayed]

FINDINGS: Segmentation: The lowest lumbar type non-rib-bearing vertebra is
labeled as L5.

Alignment:  3 mm degenerative retrolisthesis at L3-4.

Vertebrae: Posterolateral rod and pedicle screw fixation at L4-L5-S1
bilaterally. Interbody fusion at L4-5 and L5-S1.

Facet and perifacet edema on the left at L2-3 and to a lesser extent
at L3-4. Type 2 degenerative endplate findings at L3-4.

Conus medullaris and cauda equina: Conus extends to the L1 level.
Conus and cauda equina appear normal. No clumping of nerve roots to
suggest arachnoiditis.

Paraspinal and other soft tissues: Sigmoid colon diverticulosis.
Lower paraspinal muscular atrophy.

Disc levels:

T12-L1: No impingement.  Left paracentral disc protrusion.

L1-2: No impingement. Disc bulge with small central disc protrusion.

L2-3: Moderate central narrowing of the thecal sac with mild
bilateral foraminal stenosis and mild bilateral subarticular lateral
recess stenosis due to disc bulge and left greater than right facet
arthropathy.

L3-4: Mild central narrowing of the thecal sac mild displacement of
the left L3 nerve in the lateral extraforaminal space as well as
borderline bilateral subarticular lateral recess stenosis due to
disc osteophyte complex and facet arthropathy.

L4-5: No impingement.  This level is fused.

L5-S1: No impingement.  This level is fused.
IMPRESSION: 1. No findings of arachnoiditis.
2. Lumbar spondylosis and degenerative disc disease causing moderate
impingement at L2-3 and mild impingement at L3-4.
3. Sigmoid colon diverticulosis.

## 2020-08-20 DIAGNOSIS — Z0000 Encounter for general adult medical examination without abnormal findings: Secondary | ICD-10-CM | POA: Diagnosis not present

## 2020-08-20 DIAGNOSIS — Z1389 Encounter for screening for other disorder: Secondary | ICD-10-CM | POA: Diagnosis not present

## 2020-08-31 DIAGNOSIS — E7800 Pure hypercholesterolemia, unspecified: Secondary | ICD-10-CM | POA: Diagnosis not present

## 2020-08-31 DIAGNOSIS — J449 Chronic obstructive pulmonary disease, unspecified: Secondary | ICD-10-CM | POA: Diagnosis not present

## 2020-08-31 DIAGNOSIS — E039 Hypothyroidism, unspecified: Secondary | ICD-10-CM | POA: Diagnosis not present

## 2020-08-31 DIAGNOSIS — E875 Hyperkalemia: Secondary | ICD-10-CM | POA: Diagnosis not present

## 2020-10-10 DIAGNOSIS — Z23 Encounter for immunization: Secondary | ICD-10-CM | POA: Diagnosis not present

## 2021-05-07 DIAGNOSIS — Z23 Encounter for immunization: Secondary | ICD-10-CM | POA: Diagnosis not present

## 2021-05-11 DIAGNOSIS — Z23 Encounter for immunization: Secondary | ICD-10-CM | POA: Diagnosis not present

## 2021-08-15 DIAGNOSIS — H353134 Nonexudative age-related macular degeneration, bilateral, advanced atrophic with subfoveal involvement: Secondary | ICD-10-CM | POA: Diagnosis not present

## 2021-08-15 DIAGNOSIS — H47323 Drusen of optic disc, bilateral: Secondary | ICD-10-CM | POA: Diagnosis not present

## 2021-08-15 DIAGNOSIS — H2513 Age-related nuclear cataract, bilateral: Secondary | ICD-10-CM | POA: Diagnosis not present

## 2021-08-15 DIAGNOSIS — H47011 Ischemic optic neuropathy, right eye: Secondary | ICD-10-CM | POA: Diagnosis not present

## 2021-08-15 DIAGNOSIS — H40033 Anatomical narrow angle, bilateral: Secondary | ICD-10-CM | POA: Diagnosis not present

## 2021-08-22 DIAGNOSIS — E039 Hypothyroidism, unspecified: Secondary | ICD-10-CM | POA: Diagnosis not present

## 2021-08-22 DIAGNOSIS — E7800 Pure hypercholesterolemia, unspecified: Secondary | ICD-10-CM | POA: Diagnosis not present

## 2021-08-22 DIAGNOSIS — E875 Hyperkalemia: Secondary | ICD-10-CM | POA: Diagnosis not present

## 2021-08-22 DIAGNOSIS — Z0000 Encounter for general adult medical examination without abnormal findings: Secondary | ICD-10-CM | POA: Diagnosis not present

## 2021-10-01 DIAGNOSIS — E039 Hypothyroidism, unspecified: Secondary | ICD-10-CM | POA: Diagnosis not present

## 2021-10-01 DIAGNOSIS — E7800 Pure hypercholesterolemia, unspecified: Secondary | ICD-10-CM | POA: Diagnosis not present

## 2021-10-01 DIAGNOSIS — R7301 Impaired fasting glucose: Secondary | ICD-10-CM | POA: Diagnosis not present

## 2021-10-01 DIAGNOSIS — J449 Chronic obstructive pulmonary disease, unspecified: Secondary | ICD-10-CM | POA: Diagnosis not present

## 2021-10-01 DIAGNOSIS — R350 Frequency of micturition: Secondary | ICD-10-CM | POA: Diagnosis not present

## 2021-10-01 DIAGNOSIS — E875 Hyperkalemia: Secondary | ICD-10-CM | POA: Diagnosis not present

## 2021-10-01 DIAGNOSIS — R609 Edema, unspecified: Secondary | ICD-10-CM | POA: Diagnosis not present

## 2021-10-01 DIAGNOSIS — R634 Abnormal weight loss: Secondary | ICD-10-CM | POA: Diagnosis not present

## 2022-02-10 DIAGNOSIS — H40033 Anatomical narrow angle, bilateral: Secondary | ICD-10-CM | POA: Diagnosis not present

## 2022-02-10 DIAGNOSIS — H47011 Ischemic optic neuropathy, right eye: Secondary | ICD-10-CM | POA: Diagnosis not present

## 2022-02-10 DIAGNOSIS — H353134 Nonexudative age-related macular degeneration, bilateral, advanced atrophic with subfoveal involvement: Secondary | ICD-10-CM | POA: Diagnosis not present

## 2022-02-10 DIAGNOSIS — H47323 Drusen of optic disc, bilateral: Secondary | ICD-10-CM | POA: Diagnosis not present

## 2022-02-10 DIAGNOSIS — H2513 Age-related nuclear cataract, bilateral: Secondary | ICD-10-CM | POA: Diagnosis not present

## 2022-02-20 DIAGNOSIS — H5703 Miosis: Secondary | ICD-10-CM | POA: Diagnosis not present

## 2022-02-20 DIAGNOSIS — H2512 Age-related nuclear cataract, left eye: Secondary | ICD-10-CM | POA: Diagnosis not present

## 2022-03-27 DIAGNOSIS — R634 Abnormal weight loss: Secondary | ICD-10-CM | POA: Diagnosis not present

## 2022-03-27 DIAGNOSIS — E46 Unspecified protein-calorie malnutrition: Secondary | ICD-10-CM | POA: Diagnosis not present

## 2022-03-28 ENCOUNTER — Other Ambulatory Visit (HOSPITAL_BASED_OUTPATIENT_CLINIC_OR_DEPARTMENT_OTHER): Payer: Self-pay | Admitting: Family Medicine

## 2022-03-28 DIAGNOSIS — R634 Abnormal weight loss: Secondary | ICD-10-CM

## 2022-04-09 ENCOUNTER — Telehealth (HOSPITAL_BASED_OUTPATIENT_CLINIC_OR_DEPARTMENT_OTHER): Payer: Self-pay

## 2022-05-02 ENCOUNTER — Ambulatory Visit (HOSPITAL_BASED_OUTPATIENT_CLINIC_OR_DEPARTMENT_OTHER)
Admission: RE | Admit: 2022-05-02 | Discharge: 2022-05-02 | Disposition: A | Discharge status: Home / Self Care | Payer: Medicare Other | Source: Ambulatory Visit | Attending: Family Medicine | Admitting: Family Medicine

## 2022-05-02 DIAGNOSIS — N134 Hydroureter: Secondary | ICD-10-CM | POA: Diagnosis not present

## 2022-05-02 DIAGNOSIS — R634 Abnormal weight loss: Secondary | ICD-10-CM | POA: Diagnosis not present

## 2022-05-02 DIAGNOSIS — N1330 Unspecified hydronephrosis: Secondary | ICD-10-CM | POA: Diagnosis not present

## 2022-05-02 DIAGNOSIS — K5730 Diverticulosis of large intestine without perforation or abscess without bleeding: Secondary | ICD-10-CM | POA: Diagnosis not present

## 2022-05-02 MED ORDER — IOHEXOL 300 MG/ML  SOLN
100.0000 mL | Freq: Once | INTRAMUSCULAR | Status: AC | PRN
  Administered 2022-05-02: 80 mL via INTRAVENOUS

## 2022-06-10 DIAGNOSIS — Z23 Encounter for immunization: Secondary | ICD-10-CM | POA: Diagnosis not present

## 2022-07-25 DIAGNOSIS — N130 Hydronephrosis with ureteropelvic junction obstruction: Secondary | ICD-10-CM | POA: Diagnosis not present

## 2022-08-14 ENCOUNTER — Emergency Department (HOSPITAL_BASED_OUTPATIENT_CLINIC_OR_DEPARTMENT_OTHER)
Admission: EM | Admit: 2022-08-14 | Discharge: 2022-08-14 | Disposition: A | Discharge status: Home / Self Care | Payer: Medicare Other | Attending: Emergency Medicine | Admitting: Emergency Medicine

## 2022-08-14 ENCOUNTER — Emergency Department (HOSPITAL_BASED_OUTPATIENT_CLINIC_OR_DEPARTMENT_OTHER): Payer: Medicare Other

## 2022-08-14 ENCOUNTER — Encounter (HOSPITAL_BASED_OUTPATIENT_CLINIC_OR_DEPARTMENT_OTHER): Payer: Self-pay | Admitting: Emergency Medicine

## 2022-08-14 ENCOUNTER — Other Ambulatory Visit: Payer: Self-pay

## 2022-08-14 DIAGNOSIS — S0990XA Unspecified injury of head, initial encounter: Secondary | ICD-10-CM | POA: Diagnosis not present

## 2022-08-14 DIAGNOSIS — W19XXXA Unspecified fall, initial encounter: Secondary | ICD-10-CM

## 2022-08-14 DIAGNOSIS — S52514A Nondisplaced fracture of right radial styloid process, initial encounter for closed fracture: Secondary | ICD-10-CM | POA: Insufficient documentation

## 2022-08-14 DIAGNOSIS — T148XXA Other injury of unspecified body region, initial encounter: Secondary | ICD-10-CM | POA: Diagnosis not present

## 2022-08-14 DIAGNOSIS — S01111A Laceration without foreign body of right eyelid and periocular area, initial encounter: Secondary | ICD-10-CM | POA: Insufficient documentation

## 2022-08-14 DIAGNOSIS — W010XXA Fall on same level from slipping, tripping and stumbling without subsequent striking against object, initial encounter: Secondary | ICD-10-CM | POA: Diagnosis not present

## 2022-08-14 DIAGNOSIS — Y9301 Activity, walking, marching and hiking: Secondary | ICD-10-CM | POA: Diagnosis not present

## 2022-08-14 DIAGNOSIS — S6991XA Unspecified injury of right wrist, hand and finger(s), initial encounter: Secondary | ICD-10-CM | POA: Diagnosis present

## 2022-08-14 DIAGNOSIS — S0180XA Unspecified open wound of other part of head, initial encounter: Secondary | ICD-10-CM | POA: Diagnosis not present

## 2022-08-14 DIAGNOSIS — Z23 Encounter for immunization: Secondary | ICD-10-CM | POA: Diagnosis not present

## 2022-08-14 DIAGNOSIS — S41111A Laceration without foreign body of right upper arm, initial encounter: Secondary | ICD-10-CM

## 2022-08-14 DIAGNOSIS — S51811A Laceration without foreign body of right forearm, initial encounter: Secondary | ICD-10-CM | POA: Insufficient documentation

## 2022-08-14 DIAGNOSIS — S0181XA Laceration without foreign body of other part of head, initial encounter: Secondary | ICD-10-CM

## 2022-08-14 DIAGNOSIS — R220 Localized swelling, mass and lump, head: Secondary | ICD-10-CM | POA: Diagnosis not present

## 2022-08-14 DIAGNOSIS — M25531 Pain in right wrist: Secondary | ICD-10-CM | POA: Diagnosis not present

## 2022-08-14 DIAGNOSIS — Y30XXXA Falling, jumping or pushed from a high place, undetermined intent, initial encounter: Secondary | ICD-10-CM | POA: Diagnosis not present

## 2022-08-14 DIAGNOSIS — G319 Degenerative disease of nervous system, unspecified: Secondary | ICD-10-CM | POA: Insufficient documentation

## 2022-08-14 MED ORDER — LIDOCAINE-EPINEPHRINE-TETRACAINE (LET) TOPICAL GEL
3.0000 mL | Freq: Once | TOPICAL | Status: AC
  Administered 2022-08-14: 3 mL via TOPICAL
  Filled 2022-08-14: qty 3

## 2022-08-14 MED ORDER — BACITRACIN ZINC 500 UNIT/GM EX OINT
TOPICAL_OINTMENT | Freq: Two times a day (BID) | CUTANEOUS | Status: DC
  Filled 2022-08-14: qty 28.35

## 2022-08-14 MED ORDER — TETANUS-DIPHTH-ACELL PERTUSSIS 5-2.5-18.5 LF-MCG/0.5 IM SUSY
0.5000 mL | PREFILLED_SYRINGE | Freq: Once | INTRAMUSCULAR | Status: AC
  Administered 2022-08-14: 0.5 mL via INTRAMUSCULAR
  Filled 2022-08-14: qty 0.5

## 2022-08-14 NOTE — ED Notes (Signed)
Patient refused brace. Provider aware.

## 2022-08-14 NOTE — ED Provider Notes (Signed)
[facility] [facility] Provider Note   CSN: [id number] Arrival date & time: 08/14/22  1733     History  Chief Complaint  Patient presents with   Kathryn Cuevas is a 89 y.o. female.  The history is provided by the patient and medical records. No language interpreter was used.  Fall This is a new problem. The current episode started 1 to 2 hours ago. The problem occurs rarely. The problem has been resolved. Pertinent negatives include no chest pain, no abdominal pain, no headaches and no shortness of breath. Nothing aggravates the symptoms. Nothing relieves the symptoms. She has tried nothing for the symptoms. The treatment provided no relief.       Home Medications Prior to Admission medications   Medication Sig Start Date End Date Taking? Authorizing Provider  levothyroxine (SYNTHROID, LEVOTHROID) 100 MCG tablet Take 100 mcg by mouth daily before breakfast.    [provider]  lidocaine (LIDODERM) 5 % Place 1 patch onto the skin daily. Remove & Discard patch within 12 hours or as directed by MD 09/25/19   Couture, Cortni S, PA-C  nitrofurantoin, macrocrystal-monohydrate, (MACROBID) 100 MG capsule Take 1 capsule (100 mg total) by mouth 2 (two) times daily. 09/25/19   Couture, Cortni S, PA-C  ondansetron (ZOFRAN) 4 MG tablet Take 1 tablet (4 mg total) by mouth every 6 (six) hours. 09/25/19   Couture, Cortni S, PA-C      Allergies    Augmentin [amoxicillin-pot clavulanate] and Sulfa antibiotics    Review of Systems   Review of Systems  Constitutional:  Negative for chills, fatigue and fever.  HENT:  Negative for congestion.   Eyes:  Negative for visual disturbance.  Respiratory:  Negative for cough, chest tightness, shortness of breath and wheezing.   Cardiovascular:  Negative for chest pain and palpitations.  Gastrointestinal:  Negative for abdominal pain, constipation, diarrhea, nausea and vomiting.  Genitourinary:  Negative for  dysuria.  Musculoskeletal:  Negative for back pain, neck pain and neck stiffness.  Skin:  Positive for wound. Negative for rash.  Neurological:  Negative for dizziness, weakness, light-headedness, numbness and headaches.  Psychiatric/Behavioral:  Negative for agitation and confusion.   All other systems reviewed and are negative.   Physical Exam Updated Vital Signs BP (!) 175/74 (BP Location: Left Arm)   Pulse 72   Temp 97.6 F (36.4 C) (Oral)   Resp 18   Wt 63.5 kg   SpO2 91%   BMI 24.03 kg/m  Physical Exam Vitals and nursing note reviewed.  Constitutional:      General: She is not in acute distress.    Appearance: She is well-developed. She is not ill-appearing, toxic-appearing or diaphoretic.  HENT:     Head: Contusion and laceration present.      Comments: Normal extraocular movements and no vision changes reported.  Minimal tenderness around the orbit but has some bruising and lacerations.  No evidence of nasal septal hematoma and oropharyngeal exam unremarkable.  No other laceration seen on the face or head.    Mouth/Throat:     Mouth: Mucous membranes are moist.  Eyes:     General: No scleral icterus.    Extraocular Movements: Extraocular movements intact.     Conjunctiva/sclera: Conjunctivae normal.     Pupils: Pupils are equal, round, and reactive to light.  Cardiovascular:     Rate and Rhythm: Normal rate and regular rhythm.     Heart sounds: No  murmur heard. Pulmonary:     Effort: Pulmonary effort is normal. No respiratory distress.     Breath sounds: Normal breath sounds. No wheezing, rhonchi or rales.  Chest:     Chest wall: No tenderness.  Abdominal:     General: Abdomen is flat.     Palpations: Abdomen is soft.     Tenderness: There is no abdominal tenderness. There is no right CVA tenderness, left CVA tenderness, guarding or rebound.  Musculoskeletal:        General: Tenderness and signs of injury present. No swelling.     Right wrist: Laceration (skin  tear) and tenderness present. No snuff box tenderness.     Cervical back: Neck supple.     Comments: Skin tear to right forearm.  Intact sensation, strength, pulses.  Tenderness in the right distal forearm/wrist area.  No snuffbox tenderness on my exam.  Skin:    General: Skin is warm and dry.     Capillary Refill: Capillary refill takes less than 2 seconds.     Findings: Bruising present. No erythema.  Neurological:     General: No focal deficit present.     Mental Status: She is alert.     Sensory: No sensory deficit.     Motor: No weakness.  Psychiatric:        Mood and Affect: Mood normal.     ED Results / Procedures / Treatments   Labs (all labs ordered are listed, but only abnormal results are displayed) Labs Reviewed - No data to display  EKG None  Radiology CT Maxillofacial Wo Contrast  Result Date: 08/14/2022 CLINICAL DATA:  Status post trauma. EXAM: CT MAXILLOFACIAL WITHOUT CONTRAST TECHNIQUE: Multidetector CT imaging of the maxillofacial structures was performed. Multiplanar CT image reconstructions were also generated. RADIATION DOSE REDUCTION: This exam was performed according to the departmental dose-optimization program which includes automated exposure control, adjustment of the mA and/or kV according to patient size and/or use of iterative reconstruction technique. COMPARISON:  None Available. FINDINGS: Osseous: No fracture or mandibular dislocation. No destructive process. Orbits: Negative. No traumatic or inflammatory finding. Sinuses: Clear. Soft tissues: There is mild lateral right-sided facial and lateral right periorbital soft tissue swelling. An associated lateral right periorbital soft tissue defect is seen. Limited intracranial: No significant or unexpected finding. IMPRESSION: 1. Mild lateral right-sided facial and lateral right periorbital soft tissue swelling with associated lateral right periorbital soft tissue defect. 2. No acute fracture or mandibular  dislocation. Electronically Signed   By: Virgina Norfolk M.D.   On: 08/14/2022 19:16   CT Head Wo Contrast  Result Date: 08/14/2022 CLINICAL DATA:  Status post trauma. EXAM: CT HEAD WITHOUT CONTRAST TECHNIQUE: Contiguous axial images were obtained from the base of the skull through the vertex without intravenous contrast. RADIATION DOSE REDUCTION: This exam was performed according to the departmental dose-optimization program which includes automated exposure control, adjustment of the mA and/or kV according to patient size and/or use of iterative reconstruction technique. COMPARISON:  None Available. FINDINGS: Brain: There is mild to moderate severity cerebral atrophy with widening of the extra-axial spaces and ventricular dilatation. There are areas of decreased attenuation within the white matter tracts of the supratentorial brain, consistent with microvascular disease changes. Vascular: No hyperdense vessel or unexpected calcification. Skull: Normal. Negative for fracture or focal lesion. Sinuses/Orbits: No acute finding. Other: There is mild lateral right facial and lateral right periorbital soft tissue swelling. An associated lateral right periorbital soft tissue defect is seen. IMPRESSION: 1. Mild  lateral right facial and lateral right periorbital soft tissue swelling with an associated lateral right periorbital soft tissue defect. 2. No acute intracranial abnormality. 3. Generalized cerebral atrophy with widening of the extra-axial spaces and ventricular dilatation. Electronically Signed   By: Virgina Norfolk M.D.   On: 08/14/2022 19:14   DG Wrist Complete Right  Result Date: 08/14/2022 CLINICAL DATA:  Fall with wrist pain EXAM: RIGHT WRIST - COMPLETE 3+ VIEW COMPARISON:  None Available. FINDINGS: No malalignment. Possible intra-articular radial styloid fracture on oblique view. Prominent cartilaginous calcification. Advanced arthritis at the first [facility] joint. Moderate arthritis at the radiocarpal  joint. Positive for soft tissue swelling IMPRESSION: 1. Possible intra-articular radial styloid fracture. 2. Advanced arthritis at the first [facility] joint. Moderate arthritis at the radiocarpal joint. Chondrocalcinosis Electronically Signed   By: Donavan Foil M.D.   On: 08/14/2022 18:21    Procedures .Marland KitchenLaceration Repair  Date/Time: 08/14/2022 8:21 PM  Performed by: Courtney Paris, MD Authorized by: Courtney Paris, MD   Consent:    Consent obtained:  Verbal   Consent given by:  Patient   Risks, benefits, and alternatives were discussed: yes     Risks discussed:  Pain, infection, poor cosmetic result and poor wound healing   Alternatives discussed:  No treatment Universal protocol:    Immediately prior to procedure, a time out was called: yes     Patient identity confirmed:  Verbally with patient Anesthesia:    Anesthesia method:  Topical application   Topical anesthetic:  LET Laceration details:    Location:  Face   Face location:  R eyebrow   Length (cm):  2   Depth (mm):  2 Pre-procedure details:    Preparation:  Patient was prepped and draped in usual sterile fashion and imaging obtained to evaluate for foreign bodies Exploration:    Limited defect created (wound extended): no     Imaging outcome: foreign body not noted     Wound exploration: wound explored through full range of motion and entire depth of wound visualized     Contaminated: no   Treatment:    Area cleansed with:  Shur-Clens and chlorhexidine   Amount of cleaning:  Standard Skin repair:    Repair method:  Sutures   Suture size:  5-0   Suture material:  Fast-absorbing gut   Suture technique:  Simple interrupted   Number of sutures:  5 Approximation:    Approximation:  Close Repair type:    Repair type:  Simple Post-procedure details:    Dressing:  Open (no dressing)   Procedure completion:  Tolerated     Medications Ordered in ED Medications  bacitracin ointment (has no administration  in time range)  Tdap (BOOSTRIX) injection 0.5 mL (0.5 mLs Intramuscular Given 08/14/22 1755)  lidocaine-EPINEPHrine-tetracaine (LET) topical gel (3 mLs Topical Given 08/14/22 1802)    ED Course/ Medical Decision Making/ A&P                             Medical Decision Making Amount and/or Complexity of Data Reviewed Radiology: ordered.    Kathryn Cuevas is a 89 y.o. female with a past medical history significant for hyperlipidemia, arthritis, and thyroid disease who presents from urgent care for fall and injuries.  According to patient, her garage walking outside when she missed stepped fell.  She did not lose consciousness as reported headache but has 2 small lacerations to her right lateral eyebrow.  Patient was urgent care and due to the evidence of head trauma they recommended she come get CT imaging and reassessment.  She also has skin tear to her right forearm that is bothering her wrist.  She is right-handed.  Otherwise she denies preceding symptoms such as fevers, chills, congestion, cough.  She denies any nausea vomiting or vision changes at this time.  Denies any abdominal pain back pain or flank pain.  Denies any other extremity symptoms.  On exam, lungs clear and chest nontender.  Abdomen nontender.  Neck nontender.  Face is overall nontender but she did have the swelling and bruising and lacerations on the right eyebrow area.  Symmetric smile.  Clear speech.  No evidence of entrapment with normal extraocular movements.  Pupils symmetric and reactive.  No other injury seen on the face.  She has a 4 cm skin tear to her right forearm that is not having arterial bleeding.  She has intact pulses, strength, and sensation distally.  No snuffbox tenderness but did have some wrist tenderness.  Had a shared decision conversation with patient and daughter.  We will get CT imaging of the head and face to look for injuries and if this is negative, will repair laceration with observable sutures.  We  will also clean and wash and dress her skin tear on her right arm but we will get x-rays to rule out fracture.  If workup reassuring after wound management, anticipate discharge home for outpatient follow-up.  Given lack of preceding symptoms or any torso symptoms we agreed to hold on extensive lab workup at this time.  8:20 PM CT head and face showed no occult facial or skull fractures.  No intracranial bleeding.  Soft tissue swelling seen.  Wrist image showed possible radial styloid fracture.  Patient was not tender in this location so I have low suspicion is broken but we will place her in a removable wrist splint and have her follow-up with PCP/hand surgery.  Sutures were placed into her laceration and 5 sutures were placed without difficulty.  Skin tear will be cleaned and dressed on her right forearm and patient be discharged for outpatient follow-up.  They agree with plan of care and understood return precautions.  Tetanus was updated.  Patient discharged in good condition.         Final Clinical Impression(s) / ED Diagnoses Final diagnoses:  Fall, initial encounter  Injury of head, initial encounter  Facial laceration, initial encounter  Skin tear of right upper arm without complication, initial encounter  Closed nondisplaced fracture of styloid process of right radius, initial encounter    Rx / DC Orders ED Discharge Orders     None       Clinical Impression: 1. Fall, initial encounter   2. Injury of head, initial encounter   3. Facial laceration, initial encounter   4. Skin tear of right upper arm without complication, initial encounter   5. Closed nondisplaced fracture of styloid process of right radius, initial encounter     Disposition: Discharge  Condition: Good  I have discussed the results, Dx and Tx plan with the pt(& family if present). He/she/they expressed understanding and agree(s) with the plan. Discharge instructions discussed at great length.  Strict return precautions discussed and pt &/or family have verbalized understanding of the instructions. No further questions at time of discharge.    New Prescriptions   No medications on file    Follow Up: Lawerance Cruel, MD [place]  [place] [address] [phone]     Leanora Cover, MD [address] [place] [address] [phone]   with hand surgery if wrist symptoms worsen or do not improve      Abdimalik Mayorquin, Gwenyth Allegra, MD 08/14/22 2024

## 2022-08-14 NOTE — ED Triage Notes (Signed)
Pt arrives with c/o a fall that happened earlier today. Pt c/o head pain and right wrist pain. Pt has laceration on right side of her head and right forearm. Pt denies blood thinners or neck pain. Pt denies LOC.

## 2022-08-14 NOTE — Discharge Instructions (Signed)
Your history, exam, evaluation today are consistent with soft tissue injury and laceration to the face after the fall but CT scan showed no evidence of skull fracture or intracranial bleeding.  The forearm showed skin tears and possible fracture in the wrist however you were not tender at the location of the suspected fracture so is unclear if this is truly a new fracture or not.  Please use the wrist brace and follow-up with hand surgery if it persists or worsens.  Please follow-up with your primary doctor as well.  The sutures were placed were absorbable and will dissolve on their own.  Please do not wash or scrub the wound and keep it dry.  Please watch for signs and symptoms of infection.  If any symptoms change or worsen acutely, please return to the nearest emergency department.

## 2022-08-25 DIAGNOSIS — H47323 Drusen of optic disc, bilateral: Secondary | ICD-10-CM | POA: Diagnosis not present

## 2022-08-25 DIAGNOSIS — S0011XA Contusion of right eyelid and periocular area, initial encounter: Secondary | ICD-10-CM | POA: Diagnosis not present

## 2022-08-25 DIAGNOSIS — H353134 Nonexudative age-related macular degeneration, bilateral, advanced atrophic with subfoveal involvement: Secondary | ICD-10-CM | POA: Diagnosis not present

## 2022-08-25 DIAGNOSIS — H40033 Anatomical narrow angle, bilateral: Secondary | ICD-10-CM | POA: Diagnosis not present

## 2022-08-25 DIAGNOSIS — H2511 Age-related nuclear cataract, right eye: Secondary | ICD-10-CM | POA: Diagnosis not present

## 2022-08-25 DIAGNOSIS — Z961 Presence of intraocular lens: Secondary | ICD-10-CM | POA: Diagnosis not present

## 2022-08-25 DIAGNOSIS — H47011 Ischemic optic neuropathy, right eye: Secondary | ICD-10-CM | POA: Diagnosis not present

## 2022-10-14 DIAGNOSIS — E7800 Pure hypercholesterolemia, unspecified: Secondary | ICD-10-CM | POA: Diagnosis not present

## 2022-10-14 DIAGNOSIS — E875 Hyperkalemia: Secondary | ICD-10-CM | POA: Diagnosis not present

## 2022-10-14 DIAGNOSIS — R7301 Impaired fasting glucose: Secondary | ICD-10-CM | POA: Diagnosis not present

## 2022-10-14 DIAGNOSIS — E039 Hypothyroidism, unspecified: Secondary | ICD-10-CM | POA: Diagnosis not present

## 2022-10-14 DIAGNOSIS — Z0000 Encounter for general adult medical examination without abnormal findings: Secondary | ICD-10-CM | POA: Diagnosis not present

## 2022-10-14 DIAGNOSIS — H6123 Impacted cerumen, bilateral: Secondary | ICD-10-CM | POA: Diagnosis not present

## 2022-10-14 DIAGNOSIS — J449 Chronic obstructive pulmonary disease, unspecified: Secondary | ICD-10-CM | POA: Diagnosis not present

## 2022-10-14 DIAGNOSIS — R634 Abnormal weight loss: Secondary | ICD-10-CM | POA: Diagnosis not present

## 2022-10-14 DIAGNOSIS — Z6820 Body mass index (BMI) 20.0-20.9, adult: Secondary | ICD-10-CM | POA: Diagnosis not present

## 2022-11-21 DIAGNOSIS — N8110 Cystocele, unspecified: Secondary | ICD-10-CM | POA: Diagnosis not present

## 2022-12-05 DIAGNOSIS — N8110 Cystocele, unspecified: Secondary | ICD-10-CM | POA: Diagnosis not present

## 2023-01-06 DIAGNOSIS — N8110 Cystocele, unspecified: Secondary | ICD-10-CM | POA: Diagnosis not present

## 2023-04-14 DIAGNOSIS — H353134 Nonexudative age-related macular degeneration, bilateral, advanced atrophic with subfoveal involvement: Secondary | ICD-10-CM | POA: Diagnosis not present

## 2023-04-14 DIAGNOSIS — H34211 Partial retinal artery occlusion, right eye: Secondary | ICD-10-CM | POA: Diagnosis not present

## 2023-04-14 DIAGNOSIS — H40033 Anatomical narrow angle, bilateral: Secondary | ICD-10-CM | POA: Diagnosis not present

## 2023-04-14 DIAGNOSIS — H2511 Age-related nuclear cataract, right eye: Secondary | ICD-10-CM | POA: Diagnosis not present

## 2023-04-14 DIAGNOSIS — H47323 Drusen of optic disc, bilateral: Secondary | ICD-10-CM | POA: Diagnosis not present

## 2023-04-14 DIAGNOSIS — Z961 Presence of intraocular lens: Secondary | ICD-10-CM | POA: Diagnosis not present

## 2023-04-14 DIAGNOSIS — H47011 Ischemic optic neuropathy, right eye: Secondary | ICD-10-CM | POA: Diagnosis not present

## 2023-04-16 DIAGNOSIS — Z23 Encounter for immunization: Secondary | ICD-10-CM | POA: Diagnosis not present

## 2023-04-17 DIAGNOSIS — H34211 Partial retinal artery occlusion, right eye: Secondary | ICD-10-CM | POA: Diagnosis not present

## 2023-04-17 DIAGNOSIS — I6523 Occlusion and stenosis of bilateral carotid arteries: Secondary | ICD-10-CM | POA: Diagnosis not present

## 2023-05-11 DIAGNOSIS — E7800 Pure hypercholesterolemia, unspecified: Secondary | ICD-10-CM | POA: Diagnosis not present

## 2023-05-11 DIAGNOSIS — R634 Abnormal weight loss: Secondary | ICD-10-CM | POA: Diagnosis not present

## 2023-05-11 DIAGNOSIS — D7589 Other specified diseases of blood and blood-forming organs: Secondary | ICD-10-CM | POA: Diagnosis not present

## 2023-05-11 DIAGNOSIS — J449 Chronic obstructive pulmonary disease, unspecified: Secondary | ICD-10-CM | POA: Diagnosis not present

## 2023-05-11 DIAGNOSIS — H34219 Partial retinal artery occlusion, unspecified eye: Secondary | ICD-10-CM | POA: Diagnosis not present

## 2023-05-11 DIAGNOSIS — R7303 Prediabetes: Secondary | ICD-10-CM | POA: Diagnosis not present

## 2023-05-12 DIAGNOSIS — N8110 Cystocele, unspecified: Secondary | ICD-10-CM | POA: Diagnosis not present

## 2023-05-24 DIAGNOSIS — H2511 Age-related nuclear cataract, right eye: Secondary | ICD-10-CM | POA: Diagnosis not present

## 2023-05-28 DIAGNOSIS — H2511 Age-related nuclear cataract, right eye: Secondary | ICD-10-CM | POA: Diagnosis not present

## 2023-05-28 DIAGNOSIS — H5703 Miosis: Secondary | ICD-10-CM | POA: Diagnosis not present

## 2023-08-24 DIAGNOSIS — J449 Chronic obstructive pulmonary disease, unspecified: Secondary | ICD-10-CM | POA: Diagnosis not present

## 2023-08-24 DIAGNOSIS — E039 Hypothyroidism, unspecified: Secondary | ICD-10-CM | POA: Diagnosis not present

## 2023-08-24 DIAGNOSIS — E7800 Pure hypercholesterolemia, unspecified: Secondary | ICD-10-CM | POA: Diagnosis not present

## 2023-09-22 DIAGNOSIS — J449 Chronic obstructive pulmonary disease, unspecified: Secondary | ICD-10-CM | POA: Diagnosis not present

## 2023-09-23 DIAGNOSIS — E039 Hypothyroidism, unspecified: Secondary | ICD-10-CM | POA: Diagnosis not present

## 2023-09-23 DIAGNOSIS — E7800 Pure hypercholesterolemia, unspecified: Secondary | ICD-10-CM | POA: Diagnosis not present

## 2023-09-23 DIAGNOSIS — J449 Chronic obstructive pulmonary disease, unspecified: Secondary | ICD-10-CM | POA: Diagnosis not present

## 2023-10-22 DIAGNOSIS — Z0000 Encounter for general adult medical examination without abnormal findings: Secondary | ICD-10-CM | POA: Diagnosis not present

## 2023-10-22 DIAGNOSIS — R7303 Prediabetes: Secondary | ICD-10-CM | POA: Diagnosis not present

## 2023-10-22 DIAGNOSIS — E039 Hypothyroidism, unspecified: Secondary | ICD-10-CM | POA: Diagnosis not present

## 2023-10-22 DIAGNOSIS — J449 Chronic obstructive pulmonary disease, unspecified: Secondary | ICD-10-CM | POA: Diagnosis not present

## 2023-10-22 DIAGNOSIS — R7301 Impaired fasting glucose: Secondary | ICD-10-CM | POA: Diagnosis not present

## 2023-10-22 DIAGNOSIS — E7800 Pure hypercholesterolemia, unspecified: Secondary | ICD-10-CM | POA: Diagnosis not present

## 2023-10-22 DIAGNOSIS — D7589 Other specified diseases of blood and blood-forming organs: Secondary | ICD-10-CM | POA: Diagnosis not present

## 2023-10-22 DIAGNOSIS — Z23 Encounter for immunization: Secondary | ICD-10-CM | POA: Diagnosis not present

## 2023-10-22 DIAGNOSIS — E875 Hyperkalemia: Secondary | ICD-10-CM | POA: Diagnosis not present

## 2023-10-22 DIAGNOSIS — Z681 Body mass index (BMI) 19 or less, adult: Secondary | ICD-10-CM | POA: Diagnosis not present

## 2023-10-24 DIAGNOSIS — J449 Chronic obstructive pulmonary disease, unspecified: Secondary | ICD-10-CM | POA: Diagnosis not present

## 2023-10-24 DIAGNOSIS — E7800 Pure hypercholesterolemia, unspecified: Secondary | ICD-10-CM | POA: Diagnosis not present

## 2023-10-24 DIAGNOSIS — E039 Hypothyroidism, unspecified: Secondary | ICD-10-CM | POA: Diagnosis not present

## 2023-10-27 ENCOUNTER — Encounter (HOSPITAL_COMMUNITY): Payer: Self-pay

## 2023-10-27 ENCOUNTER — Other Ambulatory Visit: Payer: Self-pay

## 2023-10-27 ENCOUNTER — Inpatient Hospital Stay (HOSPITAL_COMMUNITY)
Admission: EM | Admit: 2023-10-27 | Discharge: 2023-10-29 | DRG: 322 | Disposition: A | Discharge status: Home / Self Care | Attending: Internal Medicine | Admitting: Internal Medicine

## 2023-10-27 ENCOUNTER — Emergency Department (HOSPITAL_COMMUNITY)

## 2023-10-27 ENCOUNTER — Encounter (HOSPITAL_COMMUNITY)
Admission: EM | Disposition: A | Discharge status: Home / Self Care | Payer: Self-pay | Source: Home / Self Care | Attending: Internal Medicine

## 2023-10-27 DIAGNOSIS — Y840 Cardiac catheterization as the cause of abnormal reaction of the patient, or of later complication, without mention of misadventure at the time of the procedure: Secondary | ICD-10-CM | POA: Diagnosis not present

## 2023-10-27 DIAGNOSIS — E785 Hyperlipidemia, unspecified: Secondary | ICD-10-CM | POA: Diagnosis present

## 2023-10-27 DIAGNOSIS — L7632 Postprocedural hematoma of skin and subcutaneous tissue following other procedure: Secondary | ICD-10-CM | POA: Diagnosis not present

## 2023-10-27 DIAGNOSIS — J449 Chronic obstructive pulmonary disease, unspecified: Secondary | ICD-10-CM | POA: Diagnosis present

## 2023-10-27 DIAGNOSIS — Z743 Need for continuous supervision: Secondary | ICD-10-CM | POA: Diagnosis not present

## 2023-10-27 DIAGNOSIS — Z90722 Acquired absence of ovaries, bilateral: Secondary | ICD-10-CM | POA: Diagnosis not present

## 2023-10-27 DIAGNOSIS — I2584 Coronary atherosclerosis due to calcified coronary lesion: Secondary | ICD-10-CM | POA: Diagnosis present

## 2023-10-27 DIAGNOSIS — I959 Hypotension, unspecified: Secondary | ICD-10-CM | POA: Diagnosis not present

## 2023-10-27 DIAGNOSIS — M5416 Radiculopathy, lumbar region: Secondary | ICD-10-CM | POA: Diagnosis present

## 2023-10-27 DIAGNOSIS — R7303 Prediabetes: Secondary | ICD-10-CM | POA: Diagnosis not present

## 2023-10-27 DIAGNOSIS — Z720 Tobacco use: Secondary | ICD-10-CM | POA: Diagnosis present

## 2023-10-27 DIAGNOSIS — E039 Hypothyroidism, unspecified: Secondary | ICD-10-CM | POA: Diagnosis present

## 2023-10-27 DIAGNOSIS — Z79899 Other long term (current) drug therapy: Secondary | ICD-10-CM

## 2023-10-27 DIAGNOSIS — Z716 Tobacco abuse counseling: Secondary | ICD-10-CM | POA: Diagnosis not present

## 2023-10-27 DIAGNOSIS — I2510 Atherosclerotic heart disease of native coronary artery without angina pectoris: Secondary | ICD-10-CM | POA: Diagnosis present

## 2023-10-27 DIAGNOSIS — Z79890 Hormone replacement therapy (postmenopausal): Secondary | ICD-10-CM

## 2023-10-27 DIAGNOSIS — I214 Non-ST elevation (NSTEMI) myocardial infarction: Secondary | ICD-10-CM

## 2023-10-27 DIAGNOSIS — Z96643 Presence of artificial hip joint, bilateral: Secondary | ICD-10-CM | POA: Diagnosis present

## 2023-10-27 DIAGNOSIS — F17210 Nicotine dependence, cigarettes, uncomplicated: Secondary | ICD-10-CM | POA: Diagnosis present

## 2023-10-27 DIAGNOSIS — R0902 Hypoxemia: Secondary | ICD-10-CM | POA: Diagnosis not present

## 2023-10-27 DIAGNOSIS — Z8249 Family history of ischemic heart disease and other diseases of the circulatory system: Secondary | ICD-10-CM | POA: Diagnosis not present

## 2023-10-27 DIAGNOSIS — M8448XA Pathological fracture, other site, initial encounter for fracture: Secondary | ICD-10-CM | POA: Diagnosis not present

## 2023-10-27 DIAGNOSIS — Z981 Arthrodesis status: Secondary | ICD-10-CM | POA: Diagnosis not present

## 2023-10-27 DIAGNOSIS — I10 Essential (primary) hypertension: Secondary | ICD-10-CM | POA: Diagnosis not present

## 2023-10-27 DIAGNOSIS — I700 Atherosclerosis of aorta: Secondary | ICD-10-CM | POA: Diagnosis not present

## 2023-10-27 DIAGNOSIS — D7589 Other specified diseases of blood and blood-forming organs: Secondary | ICD-10-CM | POA: Insufficient documentation

## 2023-10-27 DIAGNOSIS — R0689 Other abnormalities of breathing: Secondary | ICD-10-CM | POA: Diagnosis not present

## 2023-10-27 DIAGNOSIS — R918 Other nonspecific abnormal finding of lung field: Secondary | ICD-10-CM | POA: Diagnosis not present

## 2023-10-27 DIAGNOSIS — Z90710 Acquired absence of both cervix and uterus: Secondary | ICD-10-CM

## 2023-10-27 DIAGNOSIS — Z7982 Long term (current) use of aspirin: Secondary | ICD-10-CM | POA: Diagnosis not present

## 2023-10-27 DIAGNOSIS — Z7902 Long term (current) use of antithrombotics/antiplatelets: Secondary | ICD-10-CM

## 2023-10-27 DIAGNOSIS — R5381 Other malaise: Secondary | ICD-10-CM | POA: Diagnosis present

## 2023-10-27 DIAGNOSIS — I249 Acute ischemic heart disease, unspecified: Secondary | ICD-10-CM | POA: Diagnosis present

## 2023-10-27 DIAGNOSIS — Z9861 Coronary angioplasty status: Secondary | ICD-10-CM | POA: Diagnosis not present

## 2023-10-27 DIAGNOSIS — Z95820 Peripheral vascular angioplasty status with implants and grafts: Secondary | ICD-10-CM | POA: Diagnosis not present

## 2023-10-27 DIAGNOSIS — Z955 Presence of coronary angioplasty implant and graft: Secondary | ICD-10-CM

## 2023-10-27 DIAGNOSIS — R079 Chest pain, unspecified: Secondary | ICD-10-CM | POA: Diagnosis not present

## 2023-10-27 HISTORY — DX: Non-ST elevation (NSTEMI) myocardial infarction: I21.4

## 2023-10-27 HISTORY — DX: Acute ischemic heart disease, unspecified: I24.9

## 2023-10-27 HISTORY — DX: Chronic obstructive pulmonary disease, unspecified: J44.9

## 2023-10-27 HISTORY — PX: LEFT HEART CATH AND CORONARY ANGIOGRAPHY: CATH118249

## 2023-10-27 HISTORY — PX: CORONARY STENT INTERVENTION: CATH118234

## 2023-10-27 LAB — CBC
HCT: 49.4 % — ABNORMAL HIGH (ref 36.0–46.0)
HCT: 44.8 % (ref 36.0–46.0)
Hemoglobin: 16.1 g/dL — ABNORMAL HIGH (ref 12.0–15.0)
Hemoglobin: 14.8 g/dL (ref 12.0–15.0)
MCH: 34 pg (ref 26.0–34.0)
MCH: 32.9 pg (ref 26.0–34.0)
MCHC: 32.6 g/dL (ref 30.0–36.0)
MCHC: 33 g/dL (ref 30.0–36.0)
MCV: 104.4 fL — ABNORMAL HIGH (ref 80.0–100.0)
MCV: 99.6 fL (ref 80.0–100.0)
Platelets: 218 10*3/uL (ref 150–400)
Platelets: 227 10*3/uL (ref 150–400)
RBC: 4.73 MIL/uL (ref 3.87–5.11)
RBC: 4.5 MIL/uL (ref 3.87–5.11)
RDW: 12.8 % (ref 11.5–15.5)
RDW: 13 % (ref 11.5–15.5)
WBC: 8.6 10*3/uL (ref 4.0–10.5)
WBC: 9.2 10*3/uL (ref 4.0–10.5)
nRBC: 0 % (ref 0.0–0.2)
nRBC: 0 % (ref 0.0–0.2)

## 2023-10-27 LAB — BASIC METABOLIC PANEL WITH GFR
Anion gap: 9 (ref 5–15)
BUN: 17 mg/dL (ref 8–23)
CO2: 22 mmol/L (ref 22–32)
Calcium: 8.7 mg/dL — ABNORMAL LOW (ref 8.9–10.3)
Chloride: 105 mmol/L (ref 98–111)
Creatinine, Ser: 0.59 mg/dL (ref 0.44–1.00)
GFR, Estimated: >60 mL/min (ref >60)
Glucose, Bld: 109 mg/dL — ABNORMAL HIGH (ref 70–99)
Potassium: 3.5 mmol/L (ref 3.5–5.1)
Sodium: 136 mmol/L (ref 135–145)

## 2023-10-27 LAB — APTT: aPTT: 96 s — ABNORMAL HIGH (ref 24–36)

## 2023-10-27 LAB — PHOSPHORUS: Phosphorus: 3.6 mg/dL (ref 2.5–4.6)

## 2023-10-27 LAB — CREATININE, SERUM
Creatinine, Ser: 0.73 mg/dL (ref 0.44–1.00)
GFR, Estimated: >60 mL/min (ref >60)

## 2023-10-27 LAB — POCT ACTIVATED CLOTTING TIME
Activated Clotting Time: 222 s
Activated Clotting Time: 262 s
Activated Clotting Time: 273 s
Activated Clotting Time: 418 s
Activated Clotting Time: 464 s

## 2023-10-27 LAB — MAGNESIUM: Magnesium: 1.9 mg/dL (ref 1.7–2.4)

## 2023-10-27 LAB — TROPONIN I (HIGH SENSITIVITY)
Troponin I (High Sensitivity): 160 ng/L (ref <18)
Troponin I (High Sensitivity): 201 ng/L (ref <18)
Troponin I (High Sensitivity): 238 ng/L (ref <18)
Troponin I (High Sensitivity): 843 ng/L (ref <18)

## 2023-10-27 SURGERY — LEFT HEART CATH AND CORONARY ANGIOGRAPHY
Anesthesia: LOCAL

## 2023-10-27 MED ORDER — ASPIRIN 81 MG PO CHEW: 324.0000 mg | CHEWABLE_TABLET | Freq: Once | ORAL | Status: DC

## 2023-10-27 MED ORDER — HEPARIN (PORCINE) IN NACL 1000-0.9 UT/500ML-% IV SOLN
INTRAVENOUS | Status: DC | PRN
  Administered 2023-10-27 (×4): 500 mL

## 2023-10-27 MED ORDER — MIDAZOLAM HCL 2 MG/2ML IJ SOLN
INTRAMUSCULAR | Status: AC
  Filled 2023-10-27: qty 2

## 2023-10-27 MED ORDER — SODIUM CHLORIDE 0.9 % IV SOLN: INTRAVENOUS | Status: AC

## 2023-10-27 MED ORDER — NITROGLYCERIN 1 MG/10 ML FOR IR/CATH LAB
INTRA_ARTERIAL | Status: AC
Start: 2023-10-27 — End: 2023-10-28
  Filled 2023-10-27: qty 10

## 2023-10-27 MED ORDER — ONDANSETRON HCL 4 MG/2ML IJ SOLN
4.0000 mg | Freq: Four times a day (QID) | INTRAMUSCULAR | Status: DC | PRN
Start: 2023-10-27 — End: 2023-10-29

## 2023-10-27 MED ORDER — SODIUM CHLORIDE 0.9 % IV SOLN: 250.0000 mL | INTRAVENOUS | Status: AC | PRN

## 2023-10-27 MED ORDER — HYDRALAZINE HCL 20 MG/ML IJ SOLN: 10.0000 mg | INTRAMUSCULAR | Status: AC | PRN

## 2023-10-27 MED ORDER — ACETAMINOPHEN 325 MG PO TABS: 650.0000 mg | ORAL_TABLET | Freq: Four times a day (QID) | ORAL | Status: DC | PRN

## 2023-10-27 MED ORDER — FENTANYL CITRATE (PF) 100 MCG/2ML IJ SOLN
INTRAMUSCULAR | Status: AC
  Filled 2023-10-27: qty 2

## 2023-10-27 MED ORDER — LIDOCAINE HCL (PF) 1 % IJ SOLN
INTRAMUSCULAR | Status: DC | PRN
  Administered 2023-10-27 (×2): 2 mL
  Administered 2023-10-27: 15 mL
  Administered 2023-10-27: 3 mg

## 2023-10-27 MED ORDER — ONDANSETRON HCL 4 MG PO TABS: 4.0000 mg | ORAL_TABLET | Freq: Four times a day (QID) | ORAL | Status: DC | PRN

## 2023-10-27 MED ORDER — NITROGLYCERIN 0.4 MG SL SUBL
0.4000 mg | SUBLINGUAL_TABLET | SUBLINGUAL | Status: DC | PRN
Start: 2023-10-27 — End: 2023-10-29

## 2023-10-27 MED ORDER — SODIUM CHLORIDE 0.9 % WEIGHT BASED INFUSION: 3.0000 mL/kg/h | INTRAVENOUS | Status: AC

## 2023-10-27 MED ORDER — HEPARIN BOLUS VIA INFUSION
2400.0000 [IU] | Freq: Once | INTRAVENOUS | Status: AC
  Administered 2023-10-27: 2400 [IU] via INTRAVENOUS
  Filled 2023-10-27: qty 2400

## 2023-10-27 MED ORDER — ROSUVASTATIN CALCIUM 20 MG PO TABS
20.0000 mg | ORAL_TABLET | Freq: Every day | ORAL | Status: DC
  Administered 2023-10-27 – 2023-10-29 (×3): 20 mg via ORAL
  Filled 2023-10-27 (×3): qty 1

## 2023-10-27 MED ORDER — VERAPAMIL HCL 2.5 MG/ML IV SOLN
INTRAVENOUS | Status: AC
  Filled 2023-10-27: qty 2

## 2023-10-27 MED ORDER — ASPIRIN 81 MG PO CHEW
81.0000 mg | CHEWABLE_TABLET | ORAL | Status: AC
  Administered 2023-10-27: 81 mg via ORAL
  Filled 2023-10-27: qty 1

## 2023-10-27 MED ORDER — NITROGLYCERIN 1 MG/10 ML FOR IR/CATH LAB
INTRA_ARTERIAL | Status: DC | PRN
Start: 2023-10-27 — End: 2023-10-27
  Administered 2023-10-27 (×2): 200 ug via INTRA_ARTERIAL

## 2023-10-27 MED ORDER — SODIUM CHLORIDE 0.9 % IV SOLN
INTRAVENOUS | Status: AC | PRN
  Administered 2023-10-27: 10 mL/h via INTRAVENOUS

## 2023-10-27 MED ORDER — VERAPAMIL HCL 2.5 MG/ML IV SOLN
INTRAVENOUS | Status: DC | PRN
  Administered 2023-10-27: 10 mL via INTRA_ARTERIAL

## 2023-10-27 MED ORDER — CLOPIDOGREL BISULFATE 300 MG PO TABS
ORAL_TABLET | ORAL | Status: DC | PRN
Start: 2023-10-27 — End: 2023-10-27
  Administered 2023-10-27: 600 mg via ORAL

## 2023-10-27 MED ORDER — MIDAZOLAM HCL 2 MG/2ML IJ SOLN
INTRAMUSCULAR | Status: DC | PRN
  Administered 2023-10-27 (×3): 1 mg via INTRAVENOUS

## 2023-10-27 MED ORDER — FENTANYL CITRATE (PF) 100 MCG/2ML IJ SOLN
INTRAMUSCULAR | Status: DC | PRN
  Administered 2023-10-27 (×3): 25 ug via INTRAVENOUS

## 2023-10-27 MED ORDER — HEPARIN SODIUM (PORCINE) 5000 UNIT/ML IJ SOLN
5000.0000 [IU] | Freq: Three times a day (TID) | INTRAMUSCULAR | Status: DC
  Administered 2023-10-28: 5000 [IU] via SUBCUTANEOUS
  Filled 2023-10-27 (×2): qty 1

## 2023-10-27 MED ORDER — ASPIRIN 81 MG PO TBEC
81.0000 mg | DELAYED_RELEASE_TABLET | Freq: Every day | ORAL | Status: DC
  Administered 2023-10-28 – 2023-10-29 (×2): 81 mg via ORAL
  Filled 2023-10-27 (×2): qty 1

## 2023-10-27 MED ORDER — CLOPIDOGREL BISULFATE 75 MG PO TABS
75.0000 mg | ORAL_TABLET | Freq: Every day | ORAL | Status: DC
  Administered 2023-10-28 – 2023-10-29 (×2): 75 mg via ORAL
  Filled 2023-10-27 (×2): qty 1

## 2023-10-27 MED ORDER — SODIUM CHLORIDE 0.9 % WEIGHT BASED INFUSION: 3.0000 mL/kg/h | INTRAVENOUS | Status: DC

## 2023-10-27 MED ORDER — SODIUM CHLORIDE 0.9 % WEIGHT BASED INFUSION: 1.0000 mL/kg/h | INTRAVENOUS | Status: DC

## 2023-10-27 MED ORDER — HEPARIN SODIUM (PORCINE) 1000 UNIT/ML IJ SOLN
INTRAMUSCULAR | Status: DC | PRN
  Administered 2023-10-27: 3000 [IU] via INTRAVENOUS
  Administered 2023-10-27: 2000 [IU] via INTRAVENOUS
  Administered 2023-10-27: 3000 [IU] via INTRAVENOUS
  Administered 2023-10-27 (×2): 2500 [IU] via INTRAVENOUS

## 2023-10-27 MED ORDER — ASPIRIN 81 MG PO CHEW: 81.0000 mg | CHEWABLE_TABLET | ORAL | Status: DC

## 2023-10-27 MED ORDER — ACETAMINOPHEN 650 MG RE SUPP: 650.0000 mg | Freq: Four times a day (QID) | RECTAL | Status: DC | PRN

## 2023-10-27 MED ORDER — HEPARIN SODIUM (PORCINE) 1000 UNIT/ML IJ SOLN
INTRAMUSCULAR | Status: AC
  Filled 2023-10-27: qty 10

## 2023-10-27 MED ORDER — SODIUM CHLORIDE 0.9% FLUSH
3.0000 mL | INTRAVENOUS | Status: DC | PRN
Start: 2023-10-27 — End: 2023-10-29

## 2023-10-27 MED ORDER — HEPARIN (PORCINE) 25000 UT/250ML-% IV SOLN
550.0000 [IU]/h | INTRAVENOUS | Status: DC
  Administered 2023-10-27: 550 [IU]/h via INTRAVENOUS
  Filled 2023-10-27: qty 250

## 2023-10-27 MED ORDER — SODIUM CHLORIDE 0.9% FLUSH
3.0000 mL | Freq: Two times a day (BID) | INTRAVENOUS | Status: DC
  Administered 2023-10-27 – 2023-10-28 (×2): 3 mL via INTRAVENOUS

## 2023-10-27 MED ORDER — LEVOTHYROXINE SODIUM 88 MCG PO TABS
88.0000 ug | ORAL_TABLET | Freq: Every day | ORAL | Status: DC
  Filled 2023-10-27 (×2): qty 1

## 2023-10-27 MED ORDER — IOHEXOL 350 MG/ML SOLN
INTRAVENOUS | Status: DC | PRN
  Administered 2023-10-27: 260 mL

## 2023-10-27 MED ORDER — LIDOCAINE HCL (PF) 1 % IJ SOLN
INTRAMUSCULAR | Status: AC
  Filled 2023-10-27: qty 30

## 2023-10-27 SURGICAL SUPPLY — 30 items
BALLOON EMERGE MR 2.0X12 (BALLOONS) IMPLANT
BALLOON EMERGE MR 2.5X12 (BALLOONS) IMPLANT
BALLOON SAPPHIRE NC24 3.0X12 (BALLOONS) IMPLANT
BALLOON SCOREFLEX 2.50X10 (BALLOONS) IMPLANT
BALLOON ~~LOC~~ EMERGE MR 2.75X12 (BALLOONS) IMPLANT
CATH GUIDELINER COAST (CATHETERS) IMPLANT
CATH INFINITI AMBI 5FR TG (CATHETERS) IMPLANT
CATH LAUNCHER 6FR EBU3.5 (CATHETERS) IMPLANT
CATH TELEPORT (CATHETERS) IMPLANT
CATH TELESCOPE 6F GEC (CATHETERS) IMPLANT
DEVICE RAD TR BAND REGULAR (VASCULAR PRODUCTS) IMPLANT
ELECT DEFIB PAD ADLT CADENCE (PAD) IMPLANT
GLIDESHEATH SLEND A-KIT 6F 22G (SHEATH) IMPLANT
GLIDESHEATH SLEND SS 6F .021 (SHEATH) IMPLANT
GUIDEWIRE INQWIRE 1.5J.035X260 (WIRE) IMPLANT
KIT ENCORE 26 ADVANTAGE (KITS) IMPLANT
KIT HEMO VALVE WATCHDOG (MISCELLANEOUS) IMPLANT
KIT MICROPUNCTURE NIT STIFF (SHEATH) IMPLANT
PACK CARDIAC CATHETERIZATION (CUSTOM PROCEDURE TRAY) ×1 IMPLANT
SET ATX-X65L (MISCELLANEOUS) IMPLANT
SHEATH PINNACLE 5F 10CM (SHEATH) IMPLANT
SHEATH PROBE COVER 6X72 (BAG) IMPLANT
STENT SYNERGY XD 2.50X12 (Permanent Stent) IMPLANT
STENT SYNERGY XD 2.50X20 (Permanent Stent) IMPLANT
WIRE ASAHI PROWATER 180CM (WIRE) IMPLANT
WIRE GUIDE ASAHI EXTENSION 165 (WIRE) IMPLANT
WIRE HI TORQ BMW 190CM (WIRE) IMPLANT
WIRE HI TORQ VERSACORE-J 145CM (WIRE) IMPLANT
WIRE MAILMAN 300CM (WIRE) IMPLANT
WIRE MICROINTRODUCER 60CM (WIRE) IMPLANT

## 2023-10-27 NOTE — ED Notes (Signed)
 Caregiver at bedside. Patient is in gown and aware she is going to [facility] for heart cath.  Patient up to bedside commode

## 2023-10-27 NOTE — ED Triage Notes (Signed)
 Pt arrives EMS from home with reports of chest pain that woke her up. Pt denies any chest pain at present and was given 324 ASA and 1 duo neb with EMS. Pt alert and oriented x4

## 2023-10-27 NOTE — ED Provider Notes (Signed)
[place] [facility] AT [facility] Provider Note   CSN: [id number] Arrival date & time: 10/27/23  0449     History  Chief Complaint  Patient presents with   Chest Pain    Kathryn Cuevas is a 89 y.o. female.  89 year old female presents with an onset of chest pain that woke her up from sleep.  Denies any prior cardiac history.  Does have history tobacco use.  States that the pain lasted for about an hour and was associate with dyspnea as well as diaphoresis.  Pain resolved on its own.  She has remained pain-free since that point.  EMS was called and patient given aspirin and a DuoNeb and transported here       Home Medications Prior to Admission medications   Medication Sig Start Date End Date Taking? Authorizing Provider  levothyroxine (SYNTHROID, LEVOTHROID) 100 MCG tablet Take 100 mcg by mouth daily before breakfast.    [provider]  lidocaine  (LIDODERM ) 5 % Place 1 patch onto the skin daily. Remove & Discard patch within 12 hours or as directed by MD 09/25/19   Couture, Cortni S, PA-C  nitrofurantoin , macrocrystal-monohydrate, (MACROBID ) 100 MG capsule Take 1 capsule (100 mg total) by mouth 2 (two) times daily. 09/25/19   Couture, Cortni S, PA-C  ondansetron  (ZOFRAN ) 4 MG tablet Take 1 tablet (4 mg total) by mouth every 6 (six) hours. 09/25/19   Couture, Cortni S, PA-C      Allergies    Augmentin [amoxicillin-pot clavulanate] and Sulfa antibiotics    Review of Systems   Review of Systems  All other systems reviewed and are negative.   Physical Exam Updated Vital Signs BP 116/64   Pulse 66   Temp (!) 97.5 F (36.4 C) (Oral)   Resp 16   Wt 48.1 kg   SpO2 94%   BMI 18.19 kg/m  Physical Exam Vitals and nursing note reviewed.  Constitutional:      General: She is not in acute distress.    Appearance: Normal appearance. She is well-developed. She is not toxic-appearing.  HENT:     Head: Normocephalic and atraumatic.  Eyes:     General:  Lids are normal.     Conjunctiva/sclera: Conjunctivae normal.     Pupils: Pupils are equal, round, and reactive to light.  Neck:     Thyroid : No thyroid  mass.     Trachea: No tracheal deviation.  Cardiovascular:     Rate and Rhythm: Normal rate and regular rhythm.     Heart sounds: Normal heart sounds. No murmur heard.    No gallop.  Pulmonary:     Effort: Pulmonary effort is normal. No respiratory distress.     Breath sounds: Normal breath sounds. No stridor. No decreased breath sounds, wheezing, rhonchi or rales.  Abdominal:     General: There is no distension.     Palpations: Abdomen is soft.     Tenderness: There is no abdominal tenderness. There is no rebound.  Musculoskeletal:        General: No tenderness. Normal range of motion.     Cervical back: Normal range of motion and neck supple.  Skin:    General: Skin is warm and dry.     Findings: No abrasion or rash.  Neurological:     Mental Status: She is alert and oriented to person, place, and time. Mental status is at baseline.     GCS: GCS eye subscore is 4. GCS verbal subscore is  5. GCS motor subscore is 6.     Cranial Nerves: No cranial nerve deficit.     Sensory: No sensory deficit.     Motor: Motor function is intact.  Psychiatric:        Attention and Perception: Attention normal.        Speech: Speech normal.        Behavior: Behavior normal.     ED Results / Procedures / Treatments   Labs (all labs ordered are listed, but only abnormal results are displayed) Labs Reviewed  BASIC METABOLIC PANEL WITH GFR - Abnormal; Notable for the following components:      Result Value   Glucose, Bld 109 (*)    Calcium 8.7 (*)    All other components within normal limits  CBC - Abnormal; Notable for the following components:   Hemoglobin 16.1 (*)    HCT 49.4 (*)    MCV 104.4 (*)    All other components within normal limits  TROPONIN I (HIGH SENSITIVITY) - Abnormal; Notable for the following components:   Troponin I  (High Sensitivity) 160 (*)    All other components within normal limits  TROPONIN I (HIGH SENSITIVITY) - Abnormal; Notable for the following components:   Troponin I (High Sensitivity) 201 (*)    All other components within normal limits    EKG EKG Interpretation Date/Time:  Tuesday October 27 2023 04:58:40 EDT Ventricular Rate:  70 PR Interval:  158 QRS Duration:  86 QT Interval:  432 QTC Calculation: 467 R Axis:   97  Text Interpretation: Sinus rhythm Atrial premature complexes Anteroseptal infarct, age indeterminate Confirmed by Lind Repine ([id number]) on 10/27/2023 8:22:20 AM  Radiology DG Chest 2 View Result Date: 10/27/2023 CLINICAL DATA:  Chest pain EXAM: CHEST - 2 VIEW COMPARISON:  02/28/2014 FINDINGS: Heart size and mediastinal contours are normal. Aortic atherosclerotic calcifications. Increased lung volumes. Diffuse coarsened interstitial markings noted. Right middle lobe is identified which may reflect atelectasis or airspace disease. No signs of pleural effusion or edema. Spondylosis noted throughout the thoracic spine. Chronic appearing right posterior seventh rib fracture. IMPRESSION: 1. Right middle lobe opacity which may reflect atelectasis or airspace disease. 2. Increased lung volumes and coarsened interstitial markings compatible with COPD. Electronically Signed   By: Kimberley Penman M.D.   On: 10/27/2023 05:35    Procedures Procedures    Medications Ordered in ED Medications  aspirin chewable tablet 324 mg (has no administration in time range)    ED Course/ Medical Decision Making/ A&P                                 Medical Decision Making Amount and/or Complexity of Data Reviewed Labs: ordered. Radiology: ordered.  Risk OTC drugs.   Patient is EKG shows no signs of acute ischemic changes.  Had received aspirin prior to arrival.  Patient's chest x-ray shows infiltrate possibly on the right side.  Patient denies any recent fever, cough, congestion.  For  troponin came back elevated at 160 and second second troponin was 200.  Patient meets criteria for NSTEMI.  Patient started on heparin and I will consult cardiology.  Will likely admit to the hospitalist team       Final Clinical Impression(s) / ED Diagnoses Final diagnoses:  None    Rx / DC Orders ED Discharge Orders     None         Lind Repine, MD  10/27/23 0837  

## 2023-10-27 NOTE — Interval H&P Note (Signed)
 History and Physical Interval Note:  10/27/2023 12:42 PM  Kathryn Cuevas  has presented today for surgery, with the diagnosis of nstemi.  The various methods of treatment have been discussed with the patient and family. After consideration of risks, benefits and other options for treatment, the patient has consented to  Procedure(s): LEFT HEART CATH AND CORONARY ANGIOGRAPHY (N/A) as a surgical intervention.  The patient's history has been reviewed, patient examined, no change in status, stable for surgery.  I have reviewed the patient's chart and labs.  Questions were answered to the patient's satisfaction.     Maliek Schellhorn J Ruey Storer

## 2023-10-27 NOTE — CV Procedure (Signed)
 Complex prox-LAD PCI with 2 overlapping stents. Full report to follow.  Cody Das, MD

## 2023-10-27 NOTE — H&P (Signed)
 History and Physical    Patient: Kathryn Cuevas GNF:[id number] DOB: May 12, 1932 DOA: 10/27/2023 DOS: the patient was seen and examined on 10/27/2023 PCP: Jimmey Mould, MD  Patient coming from: Home  Chief Complaint:  Chief Complaint  Patient presents with   Chest Pain   HPI: Kathryn Cuevas is a 89 y.o. female with medical history significant of hypERkalemia, hyperlipidemia, ischemic optic neuropathy of the right eye, osteoarthritis of both hips, hypothyroidism, COPD,  history of hydronephrosis, microcytosis, lumbar radiculopathy, prediabetes who presented to the emergency department complaints of chest pain associated with dyspnea and diaphoresis since earlier this morning that lasted for about an hour.  She is an active smoker and has smoked for over 62 years. Her father died of an MI at age 27 and her brother has CAD/multiple stents placed. No palpitations, PND, orthopnea or pitting edema of the lower extremities. He denied fever, chills, rhinorrhea, sore throat, wheezing or hemoptysis. No abdominal pain, nausea, emesis, diarrhea, constipation, melena or hematochezia. No flank pain, dysuria, frequency or hematuria. No polyuria, polydipsia, polyphagia or blurred vision.   Lab work: CBC showed a white count of 8.6, hemoglobin 16.1 g/dL with an MCV 846.9 fL and platelets 218.  Troponin was 160 thence 201 ng/L.  BMP showed a glucose of 109 and calcium of 8.7 mg/dL , the rest of the electrolytes and renal function were normal.  Imaging: 2 view chest radiograph shows right middle lobe opacity which may reflect atelectasis or airspace disease.  There is increased lung volumes and coarsening interstitial markings compatible with COPD.   ED course: Initial vital signs were temperature 97.6 F, pulse 84, respirations 17, BP 1 3573 mmHg O2 sat 96% on room air.  The patient was started on a heparin infusion.  Review of Systems: As mentioned in the history of present illness. All other systems reviewed and are  negative.  Past Medical History:  Diagnosis Date   History of hyperkalemia    Sporadic episodes leading to cramping etc.   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   Osteoarthritis of both hips    As well as lumbar spine   Thyroid  disease    Past Surgical History:  Procedure Laterality Date   ANTERIOR FUSION LUMBAR SPINE     TOTAL ABDOMINAL HYSTERECTOMY     With BSO   TOTAL HIP ARTHROPLASTY Bilateral    Social History:  reports that she has been smoking cigarettes. She has a 62 pack-year smoking history. She has never used smokeless tobacco. She reports that she does not currently use alcohol. She reports that she does not currently use drugs.  Allergies  Allergen Reactions   Augmentin [Amoxicillin-Pot Clavulanate] Rash   Sulfa Antibiotics Rash    Family History  Problem Relation Age of Onset   Heart disease Mother        Congenital   Heart attack Father        Died at age 40 -was a long-term smoker with poor diet>     Prior to Admission medications   Medication Sig Start Date End Date Taking? Authorizing Provider  levothyroxine (SYNTHROID, LEVOTHROID) 100 MCG tablet Take 100 mcg by mouth daily before breakfast.    [provider]  lidocaine  (LIDODERM ) 5 % Place 1 patch onto the skin daily. Remove & Discard patch within 12 hours or as directed by MD 09/25/19   Couture, Cortni S, PA-C  nitrofurantoin , macrocrystal-monohydrate, (MACROBID ) 100 MG capsule Take 1 capsule (100 mg  total) by mouth 2 (two) times daily. 09/25/19   Couture, Cortni S, PA-C  ondansetron  (ZOFRAN ) 4 MG tablet Take 1 tablet (4 mg total) by mouth every 6 (six) hours. 09/25/19   Couture, Cortni S, PA-C  rosuvastatin (CRESTOR) 5 MG tablet Take 5 mg by mouth daily. 05/13/23   [provider]  SYNTHROID 88 MCG tablet Take 88 mcg by mouth daily.    [provider]    Physical Exam: Vitals:   10/27/23 0458 10/27/23 0501 10/27/23 0804 10/27/23 0900  BP: 135/73   116/64 (!) 122/54  Pulse: 84  66 (!) 53  Resp: 17  16 12   Temp:  97.6 F (36.4 C) (!) 97.5 F (36.4 C)   TempSrc:   Oral   SpO2: 96%  94% 93%  Weight: 48.1 kg      Physical Exam Vitals reviewed.  Constitutional:      General: She is awake. She is not in acute distress.    Appearance: She is ill-appearing.  HENT:     Head: Normocephalic.     Nose: No rhinorrhea.     Mouth/Throat:     Mouth: Mucous membranes are moist.  Eyes:     General: No scleral icterus.    Pupils: Pupils are equal, round, and reactive to light.  Neck:     Vascular: No JVD.  Cardiovascular:     Heart sounds: S1 normal and S2 normal.  Pulmonary:     Breath sounds: No wheezing, rhonchi or rales.  Abdominal:     General: Bowel sounds are normal. There is no distension.     Palpations: Abdomen is soft.     Tenderness: There is no abdominal tenderness. There is no guarding.  Musculoskeletal:     Cervical back: Neck supple.     Right lower leg: No edema.     Left lower leg: No edema.  Skin:    General: Skin is warm and dry.  Neurological:     General: No focal deficit present.     Mental Status: She is alert and oriented to person, place, and time.  Psychiatric:        Mood and Affect: Mood normal.        Behavior: Behavior normal. Behavior is cooperative.     Data Reviewed:  Results are pending, will review when available.  EKG: Vent. rate 70 BPM PR interval 158 ms QRS duration 86 ms QT/QTcB 432/467 ms P-R-T axes 82 97 240 Sinus rhythm Atrial premature complexes Anteroseptal infarct, age indeterminate  Assessment and Plan: Principal Problem:   ACS (acute coronary syndrome) (HCC)   NSTEMI (non-ST elevated myocardial infarction) (HCC) Observation/PCU. Supplemental oxygen as needed. Initially on heparin infusion. Continue daily aspirin. Also started on clopidogrel. Sublingual as needed for chest pain. Trend troponin level. Serial EKG. Obtain echocardiogram. Cardiology consult and  procedure appreciated.  Active Problems:   Tobacco abuse  Tobacco cessation advised. Nicotine replacement therapy may be ordered as needed.    Chronic obstructive pulmonary disease (HCC) Supplemental oxygen and bronchodilators as needed.    Hypothyroidism Continue levothyroxine 88 mcg p.o. daily.    Prediabetes Follow-up blood glucose.    Lumbar radiculopathy Analgesics as needed.    Hypocalcemia Recheck calcium level in AM. Further workup depending on results.     Advance Care Planning:   Code Status: Full Code   Consults: Cardiology team.  Family Communication:   Severity of Illness: The appropriate patient status for this patient is OBSERVATION. Observation status is  judged to be reasonable and necessary in order to provide the required intensity of service to ensure the patient's safety. The patient's presenting symptoms, physical exam findings, and initial radiographic and laboratory data in the context of their medical condition is felt to place them at decreased risk for further clinical deterioration. Furthermore, it is anticipated that the patient will be medically stable for discharge from the hospital within 2 midnights of admission.   Author: Danice Dural, MD 10/27/2023 9:05 AM  For on call review [email or web address].   This document was prepared using Dragon voice recognition software and may contain some unintended transcription errors.

## 2023-10-27 NOTE — ED Notes (Signed)
 CRITICAL VALUE STICKER  CRITICAL VALUE: Troponin 160  RECEIVER (on-site recipient of call):Kathryn Cuevas  DATE & TIME NOTIFIED: 10/27/23 0800  MESSENGER (representative from lab):Lab  MD NOTIFIED: Leighton Punches  TIME OF NOTIFICATION:0801  RESPONSE:

## 2023-10-27 NOTE — Progress Notes (Signed)
 Notified by RN that [name] appears to have a hematoma in her forearm and continues to be bleeding at the radial access site.   There is tense tissue along the medial aspect of the proximal forearm. There does not appear to active bleed as this area has remained stable according to the RN. We will monitor overnight. She does have oozing at the radial access site when the TR band is removed. We inflated it back up to 8 (prior inflation pressure), and we will await PTT. Once oozing has subsided we will keep the TR band on low pressure (~5 ) overnight.   SBPs140-155 mmHg . Hydralazine for SBP >140 mmHg  PTT ordered   Kathryn Gibbs, MD MS  Overnight Cardiology [facility]

## 2023-10-27 NOTE — ED Notes (Signed)
 Report given to Marjean Donna, [place]

## 2023-10-27 NOTE — Consult Note (Addendum)
 Cardiology Consultation   Patient ID: Kathryn Cuevas MRN: [id number]; DOB: January 15, 1932  Admit date: 10/27/2023 Date of Consult: 10/27/2023  PCP:  Jimmey Mould, MD   [facility] Providers Cardiologist:  Randene Bustard, MD       Patient Profile: Kathryn Cuevas is a 89 y.o. female with a hx of hyperlipidemia, hypothyroidism who is being seen 10/27/2023 for the evaluation of chest pain at the request of Dr. Bonita Bussing.  History of Present Illness: Kathryn Cuevas is an 89 year old female with above medical history.  Per chart review, does not appear that patient has any past cardiac history.  Does does not routinely follow-up with cardiology.  Was seen once by Dr. Addie Holstein in 2019.  At that time, there was concern that patient possibly had a TIA in 10/2016.  She had carotid ultrasounds in 11/2016 that showed no significant intimal thickening or atherosclerotic plaque bilaterally.  She had an echocardiogram in 09/2017 that showed EF 60-65%, no regional wall motion abnormalities, no significant valvular abnormalities.  No evidence of PFO or ASD.  Nothing to suggest a source for possible TIA. She has not been seen by cardiology since that time.   Patient presented to the ED on 6/3 around 5 AM with reports of chest pain that woke her up from sleep.  Initial vital signs in the ED showed BP 135/73, oxygen 96%, heart rate 84 bpm.  Labs significant for high-sensitivity troponin 160>201.  Potassium 3.5, creatinine 0.59, WBC 8.6, hemoglobin 16.1.  Chest x-ray showed right middle lobe opacity which may reflect atelectasis or airspace disease, increased lung volumes and coarsened interstitial markings compatible with COPD.  EKG showed sinus rhythm with PACs, T wave inversions in V2-V6.   Patient was given aspirin 324 mg with EMS.  Started on IV heparin in the ED.  Cardiology consulted.  On interview, patient reports that she had gone to sleep as usual yesterday. She was woken up from sleep in the middle of the night  with substernal chest pressure. Pressure did not radiate. Associated with mild shortness of breath and diaphoresis. No nausea or vomiting, syncope, dizziness. She tried to lay in different positions, but the pressure did not go away. Symptoms lasted for about 45 minutes and then resolved. Since coming to the ED, she continues to be a bit short of breath but has not had chest pain. No fevers, chills, or body aches.   Patient denies personal cardiac history. She has smoked about 0.5-0.75 packs of cigarettes per day for the past 65 years. She is able to do her ADLs independently and can mostly take care of herself.   Past Medical History:  Diagnosis Date   History of hyperkalemia    Sporadic episodes leading to cramping etc.   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   Osteoarthritis of both hips    As well as lumbar spine   Thyroid  disease     Past Surgical History:  Procedure Laterality Date   ANTERIOR FUSION LUMBAR SPINE     TOTAL ABDOMINAL HYSTERECTOMY     With BSO   TOTAL HIP ARTHROPLASTY Bilateral      Scheduled Meds:  [START ON 10/28/2023] aspirin EC  81 mg Oral Daily   rosuvastatin  20 mg Oral Daily   Continuous Infusions:  heparin 550 Units/hr (10/27/23 0926)   PRN Meds: acetaminophen **OR** acetaminophen, nitroGLYCERIN, ondansetron  **OR** ondansetron  (ZOFRAN ) IV  Allergies:    Allergies  Allergen Reactions   Augmentin [  Amoxicillin-Pot Clavulanate] Rash   Sulfa Antibiotics Rash    Social History:   Social History   Socioeconomic History   Marital status: Widowed    Spouse name: Not on file   Number of children: Not on file   Years of education: Not on file   Highest education level: Not on file  Occupational History   Not on file  Tobacco Use   Smoking status: Every Day    Current packs/day: 1.00    Average packs/day: 1 pack/day for 62.0 years (62.0 ttl pk-yrs)    Types: Cigarettes   Smokeless tobacco: Never  Substance and Sexual  Activity   Alcohol use: Not Currently   Drug use: Not Currently   Sexual activity: Not on file  Other Topics Concern   Not on file  Social History Narrative   Widowed mother of 2 with 4 grandchildren and 5 great-grandchildren.   She lives alone.     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  She has to use a walker but does do some stretching and PT exercises.   She is a current smoker of roughly 1 pack a day for over 62 years.   Social Drivers of Corporate investment banker Strain: Not on file  Food Insecurity: Not on file  Transportation Needs: Not on file  Physical Activity: Not on file  Stress: Not on file  Social Connections: Not on file  Intimate Partner Violence: Not on file    Family History:    Family History  Problem Relation Age of Onset   Heart disease Mother        Congenital   Heart attack Father        Died at age 88 -was a long-term smoker with poor diet>      ROS:  Please see the history of present illness.   All other ROS reviewed and negative.     Physical Exam/Data: Vitals:   10/27/23 0458 10/27/23 0501 10/27/23 0804 10/27/23 0900  BP: 135/73  116/64 (!) 122/54  Pulse: 84  66 (!) 53  Resp: 17  16 12   Temp:  97.6 F (36.4 C) (!) 97.5 F (36.4 C)   TempSrc:   Oral   SpO2: 96%  94% 93%  Weight: 48.1 kg      No intake or output data in the 24 hours ending 10/27/23 1010    10/27/2023    4:58 AM 08/14/2022    5:38 PM 09/22/2017    1:53 PM  Last 3 Weights  Weight (lbs) 106 lb 140 lb 141 lb  Weight (kg) 48.081 kg 63.504 kg 63.957 kg     Body mass index is 18.19 kg/m.  General:  Thin, elderly female. Laying in the bed with head elevated. No acute distress  HEENT: normal Neck: no JVD Vascular: Radial pulses 2+ bilaterally Cardiac:  normal S1, S2; RRR; no murmur   Lungs:  Fine crackles in bilateral lung bases. Normal WOB on  room air  Abd: soft, nontender  Ext: no edema in BLE  Musculoskeletal:  No deformities Skin: warm and dry  Neuro:  CNs 2-12 intact, no focal abnormalities noted Psych:  Normal affect   EKG:  The EKG was personally reviewed and demonstrates:  sinus rhythm with PACs, T wave inversions in V2-V6.  Telemetry:  Telemetry was personally reviewed  and demonstrates:  NSR   Relevant CV Studies: Cardiac Studies & Procedures   ______________________________________________________________________________________________     ECHOCARDIOGRAM  ECHOCARDIOGRAM LIMITED BUBBLE STUDY 10/21/2017  Narrative *[facility]* [address] [place], [place] [address] [phone]  ------------------------------------------------------------------- Transthoracic Echocardiography  Patient:    Monzerrath, Mcburney MR #:       [id number] Study Date: 10/21/2017 Gender:     F Age:        52 Height:     162.6 cm Weight:     64 kg BSA:        1.71 m^2 Pt. Status: Room:  SONOGRAPHER  Camas, Will ATTENDING    Randene Bustard, MD ORDERING     Randene Bustard, MD REFERRING    Randene Bustard, MD PERFORMING   [facility], Outpatient  cc:  ------------------------------------------------------------------- LV EF: 60% -   65%  ------------------------------------------------------------------- Indications:      (G45.3).  ------------------------------------------------------------------- History:   PMH:  Amaurosis fugax. Acquired from the patient and from the patient&'s chart.  Risk factors:  Current tobacco use. Dyslipidemia.  ------------------------------------------------------------------- Study Conclusions  - Left ventricle: The cavity size was normal. There was mild concentric hypertrophy. Systolic function was normal. The estimated ejection fraction was in the range of 60% to 65%. Wall motion was normal; there were no regional wall motion abnormalities. - Aortic valve: Transvalvular velocity was  within the normal range. There was no stenosis. There was no regurgitation. - Mitral valve: Transvalvular velocity was within the normal range. There was no evidence for stenosis. There was no regurgitation. - Right ventricle: The cavity size was normal. Wall thickness was normal. Systolic function was normal. - Atrial septum: No defect or patent foramen ovale was identified by color flow Doppler or saline microcavitation study. - Tricuspid valve: Transvalvular velocity was within the normal range. - Pulmonary arteries: Systolic pressure was within the normal range. PA peak pressure: 24 mm Hg (S).  ------------------------------------------------------------------- Study data:  No prior study was available for comparison. Procedure:  The patient reported no pain pre or post test. Transthoracic echocardiography for left ventricular function evaluation and to find the source of emboli. Image quality was adequate. Intravenous contrast (agitated saline) was administered to identify shunting.          Transthoracic echocardiography. M-mode, limited 2D, limited spectral Doppler, and color Doppler. Birthdate:  Patient birthdate: 02-Jun-1931.  Age:  Patient is 89 yr old.  Sex:  Gender: female.    BMI: 24.2 kg/m^2.  Blood pressure: 140/64  Patient status:  Outpatient.  Study date:  Study date: 10/21/2017. Study time: 01:24 PM.  Location:  [facility]  -------------------------------------------------------------------  ------------------------------------------------------------------- Left ventricle:  The cavity size was normal. There was mild concentric hypertrophy. Systolic function was normal. The estimated ejection fraction was in the range of 60% to 65%. Wall motion was normal; there were no regional wall motion abnormalities.  ------------------------------------------------------------------- Aortic valve:   Trileaflet; normal thickness leaflets. Mobility was not restricted.   Doppler:  Transvalvular velocity was within the normal range. There was no stenosis. There was no regurgitation.  ------------------------------------------------------------------- Aorta:  Aortic root: The aortic root was normal in size.  ------------------------------------------------------------------- Mitral valve:   Structurally normal valve.   Mobility was not restricted.  Doppler:  Transvalvular velocity was within the normal range. There was no evidence for stenosis. There was no regurgitation.  ------------------------------------------------------------------- Left atrium:  The atrium was normal in size.  ------------------------------------------------------------------- Atrial septum:  No defect or patent foramen ovale was  identified by color flow Doppler or saline microcavitation study.  ------------------------------------------------------------------- Right ventricle:  The cavity size was normal. Wall thickness was normal. Systolic function was normal.  ------------------------------------------------------------------- Pulmonic valve:    Doppler:  Transvalvular velocity was within the normal range. There was no evidence for stenosis.  ------------------------------------------------------------------- Tricuspid valve:   Structurally normal valve.    Doppler: Transvalvular velocity was within the normal range. There was mild regurgitation.  ------------------------------------------------------------------- Pulmonary artery:   The main pulmonary artery was normal-sized. Systolic pressure was within the normal range.  ------------------------------------------------------------------- Right atrium:  The atrium was normal in size.  ------------------------------------------------------------------- Pericardium:  There was no pericardial effusion.  ------------------------------------------------------------------- Systemic veins: Inferior vena cava: The  vessel was normal in size. The respirophasic diameter changes were in the normal range (>= 50%), consistent with normal central venous pressure.  ------------------------------------------------------------------- Measurements  Left ventricle                         Value        Reference LV ID, ED, PLAX chordal        (L)     39.4  mm     43 - 52 LV ID, ES, PLAX chordal                24.8  mm     23 - 38 LV fx shortening, PLAX chordal         37    %      >=29 LV PW thickness, ED                    10.2  mm     --------- IVS/LV PW ratio, ED                    1.17         <=1.3  Ventricular septum                     Value        Reference IVS thickness, ED                      11.9  mm     ---------  Aorta                                  Value        Reference Aortic root ID, ED                     32    mm     ---------  Left atrium                            Value        Reference LA ID, A-P, ES                         30    mm     --------- LA ID/bsa, A-P                         1.76  cm/m^2 <=2.2  Pulmonary arteries  Value        Reference PA pressure, S, DP                     24    mm Hg  <=30  Tricuspid valve                        Value        Reference Tricuspid regurg peak velocity         227   cm/s   --------- Tricuspid peak RV-RA gradient          21    mm Hg  ---------  Systemic veins                         Value        Reference Estimated CVP                          3     mm Hg  ---------  Right ventricle                        Value        Reference RV pressure, S, DP                     24    mm Hg  <=30  Legend: (L)  and  (H)  mark values outside specified reference range.  ------------------------------------------------------------------- Prepared and Electronically Authenticated by  Maudine Sos, MD 2019-05-29T17:18:43           ______________________________________________________________________________________________       Laboratory Data: High Sensitivity Troponin:   Recent Labs  Lab 10/27/23 0523 10/27/23 0720  TROPONINIHS 160* 201*     Chemistry Recent Labs  Lab 10/27/23 0523  NA 136  K 3.5  CL 105  CO2 22  GLUCOSE 109*  BUN 17  CREATININE 0.59  CALCIUM 8.7*  GFRNONAA >60  ANIONGAP 9    No results for input(s): "PROT", "ALBUMIN", "AST", "ALT", "ALKPHOS", "BILITOT" in the last 168 hours. Lipids No results for input(s): "CHOL", "TRIG", "HDL", "LABVLDL", "LDLCALC", "CHOLHDL" in the last 168 hours.  Hematology Recent Labs  Lab 10/27/23 0523  WBC 8.6  RBC 4.73  HGB 16.1*  HCT 49.4*  MCV 104.4*  MCH 34.0  MCHC 32.6  RDW 12.8  PLT 218   Thyroid  No results for input(s): "TSH", "FREET4" in the last 168 hours.  BNPNo results for input(s): "BNP", "PROBNP" in the last 168 hours.  DDimer No results for input(s): "DDIMER" in the last 168 hours.  Radiology/Studies:  DG Chest 2 View Result Date: 10/27/2023 CLINICAL DATA:  Chest pain EXAM: CHEST - 2 VIEW COMPARISON:  02/28/2014 FINDINGS: Heart size and mediastinal contours are normal. Aortic atherosclerotic calcifications. Increased lung volumes. Diffuse coarsened interstitial markings noted. Right middle lobe is identified which may reflect atelectasis or airspace disease. No signs of pleural effusion or edema. Spondylosis noted throughout the thoracic spine. Chronic appearing right posterior seventh rib fracture. IMPRESSION: 1. Right middle lobe opacity which may reflect atelectasis or airspace disease. 2. Increased lung volumes and coarsened interstitial markings compatible with COPD. Electronically Signed   By: Kimberley Penman M.D.   On: 10/27/2023 05:35     Assessment and Plan:  NSTEMI  - Patient presented after having chest pain that woke her from sleep early  this AM. Pain felt like pressure, lasted 45 minutes before resolving on its  own. Associated with diaphoresis, shortness of breath  - EKG in the ED showed sinus rhythm with PACs, T wave inversions in V2-V6 - hsTn 160>201  - Patient currently chest pain free in the ED. Has been given ASA 324 mg and is on IV heparin  - Overall, presentation consistent with Type 1 NSTEMI. Discussed cardiac catheterization. Patient lives alone and is able to overall take care of herself. Does her ADLs independently - Patient willing to proceed with cath  - Continue IV heparin, ASA 81 mg daily. Increase home crestor to 20 mg daily  - Ordered echocardiogram   HLD  - Increase crestor to 20 mg daily as above   Tobacco Use  - Patient admits to smoking for the past 65 years. Estimates smoking between 10-15 cigarettes per day  - Counseled on tobacco cessation   Informed Consent   Shared Decision Making/Informed Consent{ The risks [stroke (1 in 1000), death (1 in 1000), kidney failure [usually temporary] (1 in 500), bleeding (1 in 200), allergic reaction [possibly serious] (1 in 200)], benefits (diagnostic support and management of coronary artery disease) and alternatives of a cardiac catheterization were discussed in detail with Kathryn Cuevas and she is willing to proceed.      Risk Assessment/Risk Scores:    TIMI Risk Score for Unstable Angina or Non-ST Elevation MI:   The patient's TIMI risk score is 5, which indicates a 26% risk of all cause mortality, new or recurrent myocardial infarction or need for urgent revascularization in the next 14 days.{    For questions or updates, please contact [facility] Please consult [email or web address] for contact info under   Signed, Debria Fang, PA-C  10/27/2023 10:10 AM Patient seen and examined, note reviewed with the signed Advanced Practice Provider. I personally reviewed laboratory data, imaging studies and relevant notes. I independently examined the patient and formulated the important aspects of the plan. I have personally  discussed the plan with the patient and/or family. Comments or changes to the note/plan are indicated below.  Patient seen examined by her bedside in the ED.  She reports to me that she has been experiencing intermittent chest discomfort.  But tonight this was pronounced when she had a pressure-like sensation which woke her up from her sleep.  Because this does not get better she decided to come to the ED to be evaluated.  In the ED she was noted to have elevated troponin with EKG changes therefore cardiology was consulted.  GEN:  Well nourished, well developed in no acute distress HEENT: Mucous membranes moist, good dentition NECK: No JVD; No carotid bruits LYMPHATICS: No lymphadenopathy CARDIAC: S1S2 noted, RRR, no murmurs, rubs, gallops RESPIRATORY:  Clear to auscultation without rales, wheezing or rhonchi  ABDOMEN: Soft, non-tender, non-distended, bowel sounds noted, no guarding EXTREMITIES:No cyanosis, no cyanosis, no clubbing MUSCULOSKELETAL: No deformity  SKIN: Warm and dry NEUROLOGIC:  Alert and oriented x 3, nonfocal PSYCHIATRIC:  Normal affect, good insight   NSTEMI Tobacco use Hyperlipidemia  Her symptoms are concerning for NSTEMI EKG changes and elevated troponin with her risk factors really leads to no other choice but to pursue an ischemic evaluation in this patient.  A left heart cath would be appropriate at this time.  I have discussed with the patient she is in agreement with this.  While we wait for heart cath we will continue her heparin drip, aspirin 81 mg daily,  Crestor 20 mg daily.  Will also get echocardiogram to assess her LV function and for any other structural abnormalities.  Cessation has been advised.  Informed Consent   Shared Decision Making/Informed Consent The risks [stroke (1 in 1000), death (1 in 1000), kidney failure [usually temporary] (1 in 500), bleeding (1 in 200), allergic reaction [possibly serious] (1 in 200)], benefits (diagnostic support and  management of coronary artery disease) and alternatives of a cardiac catheterization were discussed in detail with Kathryn Cuevas and she is willing to proceed.       Allure Greaser DO, MS [facility] Attending Cardiologist [facility]  [address] [place], [place] [address] [phone] Website: [email or web address]

## 2023-10-27 NOTE — Progress Notes (Signed)
 PHARMACY - ANTICOAGULATION CONSULT NOTE  Pharmacy Consult for Heparin Indication: chest pain/ACS  Allergies  Allergen Reactions   Augmentin [Amoxicillin-Pot Clavulanate] Rash   Sulfa Antibiotics Rash    Patient Measurements: Weight: 48.1 kg (106 lb)  Vital Signs: Temp: 97.5 F (36.4 C) (06/03 0804) Temp Source: Oral (06/03 0804) BP: 122/54 (06/03 0900) Pulse Rate: 53 (06/03 0900)  Labs: Recent Labs    10/27/23 0523 10/27/23 0720  HGB 16.1*  --   HCT 49.4*  --   PLT 218  --   CREATININE 0.59  --   TROPONINIHS 160* 201*    CrCl cannot be calculated (Unknown ideal weight.).   Medical History: Past Medical History:  Diagnosis Date   History of hyperkalemia    Sporadic episodes leading to cramping etc.   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 Now resolved. EKG negative for acute ischemic changes. +Troponin 160, 201 -chest x-ray shows infiltrate possibly on the right side.   PMH: h/o tobacco, hypothyroid  AC/Heme: NSTEMI to start IV heparin. Baseline Hgb 16.1, Plts 218 WNL,   Goal of Therapy:  Heparin level 0.3-0.7 units/ml Monitor platelets by anticoagulation protocol: Yes   Plan:  Hep bolus 2400 units Heparin 550 units/hr Check heparin level in 8 hrs. Daily HL and CBC   Kathryn Cuevas Kathryn Cuevas, PharmD, BCPS Clinical Staff Pharmacist Kathryn Cuevas 10/27/2023,9:04 AM

## 2023-10-27 NOTE — ED Notes (Signed)
 Report given to Chaim Colony, RN

## 2023-10-27 NOTE — H&P (View-Only) (Signed)
 Cardiology Consultation   Patient ID: Kathryn Cuevas MRN: [id number]; DOB: [birth year]  Admit date: 10/27/2023 Date of Consult: 10/27/2023  PCP:  Kathryn Mould, MD   [facility] Providers Cardiologist:  Kathryn Bustard, MD       Patient Profile: Kathryn Cuevas is a 89 y.o. female with a hx of hyperlipidemia, hypothyroidism who is being seen 10/27/2023 for the evaluation of chest pain at the request of Dr. Bonita Cuevas.  History of Present Illness: Kathryn Cuevas is an 89 year old female with above medical history.  Per chart review, does not appear that patient has any past cardiac history.  Does does not routinely follow-up with cardiology.  Was seen once by Dr. Addie Cuevas in 2019.  At that time, there was concern that patient possibly had a TIA in 10/2016.  She had carotid ultrasounds in 11/2016 that showed no significant intimal thickening or atherosclerotic plaque bilaterally.  She had an echocardiogram in 09/2017 that showed EF 60-65%, no regional wall motion abnormalities, no significant valvular abnormalities.  No evidence of PFO or ASD.  Nothing to suggest a source for possible TIA. She has not been seen by cardiology since that time.   Patient presented to the ED on 6/3 around 5 AM with reports of chest pain that woke her up from sleep.  Initial vital signs in the ED showed BP 135/73, oxygen 96%, heart rate 84 bpm.  Labs significant for high-sensitivity troponin 160>201.  Potassium 3.5, creatinine 0.59, WBC 8.6, hemoglobin 16.1.  Chest x-ray showed right middle lobe opacity which may reflect atelectasis or airspace disease, increased lung volumes and coarsened interstitial markings compatible with COPD.  EKG showed sinus rhythm with PACs, T wave inversions in V2-V6.   Patient was given aspirin 324 mg with EMS.  Started on IV heparin in the ED.  Cardiology consulted.  On interview, patient reports that she had gone to sleep as usual yesterday. She was woken up from sleep in the middle of the night  with substernal chest pressure. Pressure did not radiate. Associated with mild shortness of breath and diaphoresis. No nausea or vomiting, syncope, dizziness. She tried to lay in different positions, but the pressure did not go away. Symptoms lasted for about 45 minutes and then resolved. Since coming to the ED, she continues to be a bit short of breath but has not had chest pain. No fevers, chills, or body aches.   Patient denies personal cardiac history. She has smoked about 0.5-0.75 packs of cigarettes per day for the past 65 years. She is able to do her ADLs independently and can mostly take care of herself.   Past Medical History:  Diagnosis Date   History of hyperkalemia    Sporadic episodes leading to cramping etc.   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   Osteoarthritis of both hips    As well as lumbar spine   Thyroid  disease     Past Surgical History:  Procedure Laterality Date   ANTERIOR FUSION LUMBAR SPINE     TOTAL ABDOMINAL HYSTERECTOMY     With BSO   TOTAL HIP ARTHROPLASTY Bilateral      Scheduled Meds:  [START ON 10/28/2023] aspirin EC  81 mg Oral Daily   rosuvastatin  20 mg Oral Daily   Continuous Infusions:  heparin 550 Units/hr (10/27/23 0926)   PRN Meds: acetaminophen **OR** acetaminophen, nitroGLYCERIN, ondansetron  **OR** ondansetron  (ZOFRAN ) IV  Allergies:    Allergies  Allergen Reactions   Augmentin [  Amoxicillin-Pot Clavulanate] Rash   Sulfa Antibiotics Rash    Social History:   Social History   Socioeconomic History   Marital status: Widowed    Spouse name: Not on file   Number of children: Not on file   Years of education: Not on file   Highest education level: Not on file  Occupational History   Not on file  Tobacco Use   Smoking status: Every Day    Current packs/day: 1.00    Average packs/day: 1 pack/day for 62.0 years (62.0 ttl pk-yrs)    Types: Cigarettes   Smokeless tobacco: Never  Substance and Sexual  Activity   Alcohol use: Not Currently   Drug use: Not Currently   Sexual activity: Not on file  Other Topics Concern   Not on file  Social History Narrative   Widowed mother of 2 with 4 grandchildren and 5 great-grandchildren.   She lives alone.     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  She has to use a walker but does do some stretching and PT exercises.   She is a current smoker of roughly 1 pack a day for over 62 years.   Social Drivers of Corporate investment banker Strain: Not on file  Food Insecurity: Not on file  Transportation Needs: Not on file  Physical Activity: Not on file  Stress: Not on file  Social Connections: Not on file  Intimate Partner Violence: Not on file    Family History:    Family History  Problem Relation Age of Onset   Heart disease Mother        Congenital   Heart attack Father        Died at age 88 -was a long-term smoker with poor diet>      ROS:  Please see the history of present illness.   All other ROS reviewed and negative.     Physical Exam/Data: Vitals:   10/27/23 0458 10/27/23 0501 10/27/23 0804 10/27/23 0900  BP: 135/73  116/64 (!) 122/54  Pulse: 84  66 (!) 53  Resp: 17  16 12   Temp:  97.6 F (36.4 C) (!) 97.5 F (36.4 C)   TempSrc:   Oral   SpO2: 96%  94% 93%  Weight: 48.1 kg      No intake or output data in the 24 hours ending 10/27/23 1010    10/27/2023    4:58 AM 08/14/2022    5:38 PM 09/22/2017    1:53 PM  Last 3 Weights  Weight (lbs) 106 lb 140 lb 141 lb  Weight (kg) 48.081 kg 63.504 kg 63.957 kg     Body mass index is 18.19 kg/m.  General:  Thin, elderly female. Laying in the bed with head elevated. No acute distress  HEENT: normal Neck: no JVD Vascular: Radial pulses 2+ bilaterally Cardiac:  normal S1, S2; RRR; no murmur   Lungs:  Fine crackles in bilateral lung bases. Normal WOB on  room air  Abd: soft, nontender  Ext: no edema in BLE  Musculoskeletal:  No deformities Skin: warm and dry  Neuro:  CNs 2-12 intact, no focal abnormalities noted Psych:  Normal affect   EKG:  The EKG was personally reviewed and demonstrates:  sinus rhythm with PACs, T wave inversions in V2-V6.  Telemetry:  Telemetry was personally reviewed  and demonstrates:  NSR   Relevant CV Studies: Cardiac Studies & Procedures   ______________________________________________________________________________________________     ECHOCARDIOGRAM  ECHOCARDIOGRAM LIMITED BUBBLE STUDY 10/21/2017  Narrative *[facility]* [address] [place], [place] [address] [phone]  ------------------------------------------------------------------- Transthoracic Echocardiography  Patient:    Neisha, Hinger MR #:       [id number] Study Date: 10/21/2017 Gender:     F Age:        69 Height:     162.6 cm Weight:     64 kg BSA:        1.71 m^2 Pt. Status: Room:  SONOGRAPHER  [place], Will ATTENDING    Kathryn Bustard, MD ORDERING     Kathryn Bustard, MD REFERRING    Kathryn Bustard, MD PERFORMING   [facility], Outpatient  cc:  ------------------------------------------------------------------- LV EF: 60% -   65%  ------------------------------------------------------------------- Indications:      (G45.3).  ------------------------------------------------------------------- History:   PMH:  Amaurosis fugax. Acquired from the patient and from the patient&'s chart.  Risk factors:  Current tobacco use. Dyslipidemia.  ------------------------------------------------------------------- Study Conclusions  - Left ventricle: The cavity size was normal. There was mild concentric hypertrophy. Systolic function was normal. The estimated ejection fraction was in the range of 60% to 65%. Wall motion was normal; there were no regional wall motion abnormalities. - Aortic valve: Transvalvular velocity was  within the normal range. There was no stenosis. There was no regurgitation. - Mitral valve: Transvalvular velocity was within the normal range. There was no evidence for stenosis. There was no regurgitation. - Right ventricle: The cavity size was normal. Wall thickness was normal. Systolic function was normal. - Atrial septum: No defect or patent foramen ovale was identified by color flow Doppler or saline microcavitation study. - Tricuspid valve: Transvalvular velocity was within the normal range. - Pulmonary arteries: Systolic pressure was within the normal range. PA peak pressure: 24 mm Hg (S).  ------------------------------------------------------------------- Study data:  No prior study was available for comparison. Procedure:  The patient reported no pain pre or post test. Transthoracic echocardiography for left ventricular function evaluation and to find the source of emboli. Image quality was adequate. Intravenous contrast (agitated saline) was administered to identify shunting.          Transthoracic echocardiography. M-mode, limited 2D, limited spectral Doppler, and color Doppler. Birthdate:  Patient birthdate: 1931/08/11.  Age:  Patient is 89 yr old.  Sex:  Gender: female.    BMI: 24.2 kg/m^2.  Blood pressure: 140/64  Patient status:  Outpatient.  Study date:  Study date: 10/21/2017. Study time: 01:24 PM.  Location:  [facility]  -------------------------------------------------------------------  ------------------------------------------------------------------- Left ventricle:  The cavity size was normal. There was mild concentric hypertrophy. Systolic function was normal. The estimated ejection fraction was in the range of 60% to 65%. Wall motion was normal; there were no regional wall motion abnormalities.  ------------------------------------------------------------------- Aortic valve:   Trileaflet; normal thickness leaflets. Mobility was not restricted.   Doppler:  Transvalvular velocity was within the normal range. There was no stenosis. There was no regurgitation.  ------------------------------------------------------------------- Aorta:  Aortic root: The aortic root was normal in size.  ------------------------------------------------------------------- Mitral valve:   Structurally normal valve.   Mobility was not restricted.  Doppler:  Transvalvular velocity was within the normal range. There was no evidence for stenosis. There was no regurgitation.  ------------------------------------------------------------------- Left atrium:  The atrium was normal in size.  ------------------------------------------------------------------- Atrial septum:  No defect or patent foramen ovale was  identified by color flow Doppler or saline microcavitation study.  ------------------------------------------------------------------- Right ventricle:  The cavity size was normal. Wall thickness was normal. Systolic function was normal.  ------------------------------------------------------------------- Pulmonic valve:    Doppler:  Transvalvular velocity was within the normal range. There was no evidence for stenosis.  ------------------------------------------------------------------- Tricuspid valve:   Structurally normal valve.    Doppler: Transvalvular velocity was within the normal range. There was mild regurgitation.  ------------------------------------------------------------------- Pulmonary artery:   The main pulmonary artery was normal-sized. Systolic pressure was within the normal range.  ------------------------------------------------------------------- Right atrium:  The atrium was normal in size.  ------------------------------------------------------------------- Pericardium:  There was no pericardial effusion.  ------------------------------------------------------------------- Systemic veins: Inferior vena cava: The  vessel was normal in size. The respirophasic diameter changes were in the normal range (>= 50%), consistent with normal central venous pressure.  ------------------------------------------------------------------- Measurements  Left ventricle                         Value        Reference LV ID, ED, PLAX chordal        (L)     39.4  mm     43 - 52 LV ID, ES, PLAX chordal                24.8  mm     23 - 38 LV fx shortening, PLAX chordal         37    %      >=29 LV PW thickness, ED                    10.2  mm     --------- IVS/LV PW ratio, ED                    1.17         <=1.3  Ventricular septum                     Value        Reference IVS thickness, ED                      11.9  mm     ---------  Aorta                                  Value        Reference Aortic root ID, ED                     32    mm     ---------  Left atrium                            Value        Reference LA ID, A-P, ES                         30    mm     --------- LA ID/bsa, A-P                         1.76  cm/m^2 <=2.2  Pulmonary arteries  Value        Reference PA pressure, S, DP                     24    mm Hg  <=30  Tricuspid valve                        Value        Reference Tricuspid regurg peak velocity         227   cm/s   --------- Tricuspid peak RV-RA gradient          21    mm Hg  ---------  Systemic veins                         Value        Reference Estimated CVP                          3     mm Hg  ---------  Right ventricle                        Value        Reference RV pressure, S, DP                     24    mm Hg  <=30  Legend: (L)  and  (H)  mark values outside specified reference range.  ------------------------------------------------------------------- Prepared and Electronically Authenticated by  Maudine Sos, MD 2019-05-29T17:18:43           ______________________________________________________________________________________________       Laboratory Data: High Sensitivity Troponin:   Recent Labs  Lab 10/27/23 0523 10/27/23 0720  TROPONINIHS 160* 201*     Chemistry Recent Labs  Lab 10/27/23 0523  NA 136  K 3.5  CL 105  CO2 22  GLUCOSE 109*  BUN 17  CREATININE 0.59  CALCIUM 8.7*  GFRNONAA >60  ANIONGAP 9    No results for input(s): "PROT", "ALBUMIN", "AST", "ALT", "ALKPHOS", "BILITOT" in the last 168 hours. Lipids No results for input(s): "CHOL", "TRIG", "HDL", "LABVLDL", "LDLCALC", "CHOLHDL" in the last 168 hours.  Hematology Recent Labs  Lab 10/27/23 0523  WBC 8.6  RBC 4.73  HGB 16.1*  HCT 49.4*  MCV 104.4*  MCH 34.0  MCHC 32.6  RDW 12.8  PLT 218   Thyroid  No results for input(s): "TSH", "FREET4" in the last 168 hours.  BNPNo results for input(s): "BNP", "PROBNP" in the last 168 hours.  DDimer No results for input(s): "DDIMER" in the last 168 hours.  Radiology/Studies:  DG Chest 2 View Result Date: 10/27/2023 CLINICAL DATA:  Chest pain EXAM: CHEST - 2 VIEW COMPARISON:  02/28/2014 FINDINGS: Heart size and mediastinal contours are normal. Aortic atherosclerotic calcifications. Increased lung volumes. Diffuse coarsened interstitial markings noted. Right middle lobe is identified which may reflect atelectasis or airspace disease. No signs of pleural effusion or edema. Spondylosis noted throughout the thoracic spine. Chronic appearing right posterior seventh rib fracture. IMPRESSION: 1. Right middle lobe opacity which may reflect atelectasis or airspace disease. 2. Increased lung volumes and coarsened interstitial markings compatible with COPD. Electronically Signed   By: Kimberley Penman M.D.   On: 10/27/2023 05:35     Assessment and Plan:  NSTEMI  - Patient presented after having chest pain that woke her from sleep early  this AM. Pain felt like pressure, lasted 45 minutes before resolving on its  own. Associated with diaphoresis, shortness of breath  - EKG in the ED showed sinus rhythm with PACs, T wave inversions in V2-V6 - hsTn 160>201  - Patient currently chest pain free in the ED. Has been given ASA 324 mg and is on IV heparin  - Overall, presentation consistent with Type 1 NSTEMI. Discussed cardiac catheterization. Patient lives alone and is able to overall take care of herself. Does her ADLs independently - Patient willing to proceed with cath  - Continue IV heparin, ASA 81 mg daily. Increase home crestor to 20 mg daily  - Ordered echocardiogram   HLD  - Increase crestor to 20 mg daily as above   Tobacco Use  - Patient admits to smoking for the past 65 years. Estimates smoking between 10-15 cigarettes per day  - Counseled on tobacco cessation   Informed Consent   Shared Decision Making/Informed Consent{ The risks [stroke (1 in 1000), death (1 in 1000), kidney failure [usually temporary] (1 in 500), bleeding (1 in 200), allergic reaction [possibly serious] (1 in 200)], benefits (diagnostic support and management of coronary artery disease) and alternatives of a cardiac catheterization were discussed in detail with Kathryn Cuevas and she is willing to proceed.      Risk Assessment/Risk Scores:    TIMI Risk Score for Unstable Angina or Non-ST Elevation MI:   The patient's TIMI risk score is 5, which indicates a 26% risk of all cause mortality, new or recurrent myocardial infarction or need for urgent revascularization in the next 14 days.{    For questions or updates, please contact [facility] Please consult [email or web address] for contact info under   Signed, Debria Fang, PA-C  10/27/2023 10:10 AM Patient seen and examined, note reviewed with the signed Advanced Practice Provider. I personally reviewed laboratory data, imaging studies and relevant notes. I independently examined the patient and formulated the important aspects of the plan. I have personally  discussed the plan with the patient and/or family. Comments or changes to the note/plan are indicated below.  Patient seen examined by her bedside in the ED.  She reports to me that she has been experiencing intermittent chest discomfort.  But tonight this was pronounced when she had a pressure-like sensation which woke her up from her sleep.  Because this does not get better she decided to come to the ED to be evaluated.  In the ED she was noted to have elevated troponin with EKG changes therefore cardiology was consulted.  GEN:  Well nourished, well developed in no acute distress HEENT: Mucous membranes moist, good dentition NECK: No JVD; No carotid bruits LYMPHATICS: No lymphadenopathy CARDIAC: S1S2 noted, RRR, no murmurs, rubs, gallops RESPIRATORY:  Clear to auscultation without rales, wheezing or rhonchi  ABDOMEN: Soft, non-tender, non-distended, bowel sounds noted, no guarding EXTREMITIES:No cyanosis, no cyanosis, no clubbing MUSCULOSKELETAL: No deformity  SKIN: Warm and dry NEUROLOGIC:  Alert and oriented x 3, nonfocal PSYCHIATRIC:  Normal affect, good insight   NSTEMI Tobacco use Hyperlipidemia  Her symptoms are concerning for NSTEMI EKG changes and elevated troponin with her risk factors really leads to no other choice but to pursue an ischemic evaluation in this patient.  A left heart cath would be appropriate at this time.  I have discussed with the patient she is in agreement with this.  While we wait for heart cath we will continue her heparin drip, aspirin 81 mg daily,  Crestor 20 mg daily.  Will also get echocardiogram to assess her LV function and for any other structural abnormalities.  Cessation has been advised.  Informed Consent   Shared Decision Making/Informed Consent The risks [stroke (1 in 1000), death (1 in 1000), kidney failure [usually temporary] (1 in 500), bleeding (1 in 200), allergic reaction [possibly serious] (1 in 200)], benefits (diagnostic support and  management of coronary artery disease) and alternatives of a cardiac catheterization were discussed in detail with Kathryn Cuevas and she is willing to proceed.       Allure Greaser DO, MS [facility] Attending Cardiologist [facility]  [address] [place], [place] [address] [phone] Website: [email or web address]

## 2023-10-28 ENCOUNTER — Encounter (HOSPITAL_COMMUNITY): Payer: Self-pay | Admitting: Cardiology

## 2023-10-28 ENCOUNTER — Inpatient Hospital Stay (HOSPITAL_COMMUNITY)

## 2023-10-28 DIAGNOSIS — J449 Chronic obstructive pulmonary disease, unspecified: Secondary | ICD-10-CM

## 2023-10-28 DIAGNOSIS — I249 Acute ischemic heart disease, unspecified: Secondary | ICD-10-CM | POA: Diagnosis not present

## 2023-10-28 DIAGNOSIS — I214 Non-ST elevation (NSTEMI) myocardial infarction: Secondary | ICD-10-CM

## 2023-10-28 DIAGNOSIS — Z720 Tobacco use: Secondary | ICD-10-CM

## 2023-10-28 DIAGNOSIS — M5416 Radiculopathy, lumbar region: Secondary | ICD-10-CM

## 2023-10-28 DIAGNOSIS — Z95820 Peripheral vascular angioplasty status with implants and grafts: Secondary | ICD-10-CM

## 2023-10-28 DIAGNOSIS — E039 Hypothyroidism, unspecified: Secondary | ICD-10-CM

## 2023-10-28 LAB — CBC
HCT: 42.6 % (ref 36.0–46.0)
Hemoglobin: 14.3 g/dL (ref 12.0–15.0)
MCH: 33.3 pg (ref 26.0–34.0)
MCHC: 33.6 g/dL (ref 30.0–36.0)
MCV: 99.3 fL (ref 80.0–100.0)
Platelets: 213 10*3/uL (ref 150–400)
RBC: 4.29 MIL/uL (ref 3.87–5.11)
RDW: 13 % (ref 11.5–15.5)
WBC: 9.6 10*3/uL (ref 4.0–10.5)
nRBC: 0 % (ref 0.0–0.2)

## 2023-10-28 LAB — ECHOCARDIOGRAM COMPLETE
S' Lateral: 2.2 cm
Single Plane A4C EF: 46.8 %
Weight: 1696 [oz_av]

## 2023-10-28 LAB — COMPREHENSIVE METABOLIC PANEL WITH GFR
ALT: 13 U/L (ref 0–44)
AST: 26 U/L (ref 15–41)
Albumin: 2.9 g/dL — ABNORMAL LOW (ref 3.5–5.0)
Alkaline Phosphatase: 75 U/L (ref 38–126)
Anion gap: 9 (ref 5–15)
BUN: 10 mg/dL (ref 8–23)
CO2: 23 mmol/L (ref 22–32)
Calcium: 8.5 mg/dL — ABNORMAL LOW (ref 8.9–10.3)
Chloride: 104 mmol/L (ref 98–111)
Creatinine, Ser: 0.62 mg/dL (ref 0.44–1.00)
GFR, Estimated: >60 mL/min (ref >60)
Glucose, Bld: 100 mg/dL — ABNORMAL HIGH (ref 70–99)
Potassium: 3.9 mmol/L (ref 3.5–5.1)
Sodium: 136 mmol/L (ref 135–145)
Total Bilirubin: 0.8 mg/dL (ref 0.0–1.2)
Total Protein: 5.5 g/dL — ABNORMAL LOW (ref 6.5–8.1)

## 2023-10-28 MED ORDER — METOPROLOL SUCCINATE ER 25 MG PO TB24
25.0000 mg | ORAL_TABLET | Freq: Every day | ORAL | Status: DC
  Administered 2023-10-28 – 2023-10-29 (×2): 25 mg via ORAL
  Filled 2023-10-28 (×2): qty 1

## 2023-10-28 MED ORDER — HEPARIN SODIUM (PORCINE) 5000 UNIT/ML IJ SOLN
5000.0000 [IU] | Freq: Three times a day (TID) | INTRAMUSCULAR | Status: DC
  Administered 2023-10-28 – 2023-10-29 (×2): 5000 [IU] via SUBCUTANEOUS
  Filled 2023-10-28 (×2): qty 1

## 2023-10-28 NOTE — Plan of Care (Signed)

## 2023-10-28 NOTE — Progress Notes (Signed)
 CARDIAC REHAB PHASE I    Pt resting in bed, feeling well, with complaint of generalized fatigue. Pt ambulated with mobility team today. Reports tolerating well with no CP,SOB or dizziness. Post MI/stent education including restrictions, risk factors, exercise guidelines, antiplatelet therapy importance, MI booklet, NTG use, heart healthy diet, smoking cessation and CRP2 reviewed. All questions and concerns addressed. Will refer to [facility] for CRP2. Will continue to follow.  1300-1400 Ronny Colas, RN BSN 10/28/2023 2:59 PM

## 2023-10-28 NOTE — Progress Notes (Signed)
 Mobility Specialist Progress Note;   10/28/23 0923  Mobility  Activity Ambulated with assistance in hallway  Level of Assistance Contact guard assist, steadying assist  Assistive Device Front wheel walker  Distance Ambulated (ft) 100 ft  Activity Response Tolerated well  Mobility Referral Yes  Mobility visit 1 Mobility  Mobility Specialist Start Time (ACUTE ONLY) [id number]  Mobility Specialist Stop Time (ACUTE ONLY) 0940  Mobility Specialist Time Calculation (min) (ACUTE ONLY) 17 min   RN requesting pt to ambulate, pt agreeable. Pt on 3LO2 upon arrival. Required MinG assistance during ambulation for safety. VC required for RW management. Ambulated on 3LO2, SPO2 91%> throughout. Pt returned safely back to bed with all needs met, alarm on. RN and son present.   Janit Meline Mobility Specialist Please contact via SecureChat or [employer] [phone]

## 2023-10-28 NOTE — Progress Notes (Signed)
 Rounding Note   Patient Name: Kathryn Cuevas Date of Encounter: 10/28/2023  [facility] Cardiologist: Randene Bustard, MD   Subjective She feels very well this am. No chest pain or dyspnea. Voices desire to go home   Scheduled Meds:  aspirin EC  81 mg Oral Daily   clopidogrel  75 mg Oral Q breakfast   heparin  5,000 Units Subcutaneous Q8H   levothyroxine  88 mcg Oral QAC breakfast   rosuvastatin  20 mg Oral Daily   sodium chloride flush  3 mL Intravenous Q12H   Continuous Infusions:  sodium chloride     PRN Meds: sodium chloride, acetaminophen **OR** acetaminophen, nitroGLYCERIN, ondansetron  **OR** ondansetron  (ZOFRAN ) IV, sodium chloride flush   Vital Signs  Vitals:   10/28/23 0000 10/28/23 0100 10/28/23 0300 10/28/23 0700  BP: (!) 144/94 113/62 102/60 (!) 125/58  Pulse: 87 71 63 70  Resp: (!) 24 13 13 20   Temp:   98.1 F (36.7 C) 98.2 F (36.8 C)  TempSrc:   Oral Oral  SpO2: 94% 92% 91% 91%  Weight:        Intake/Output Summary (Last 24 hours) at 10/28/2023 1006 Last data filed at 10/28/2023 0820 Gross per 24 hour  Intake 360 ml  Output 1400 ml  Net -1040 ml      10/27/2023    4:58 AM 08/14/2022    5:38 PM 09/22/2017    1:53 PM  Last 3 Weights  Weight (lbs) 106 lb 140 lb 141 lb  Weight (kg) 48.081 kg 63.504 kg 63.957 kg      Telemetry NSR - Personally Reviewed  ECG  NSR with anterolateral ST-T abnormality - Personally Reviewed  Physical Exam  GEN: No acute distress. elderly   Neck: No JVD Cardiac: RRR, no murmurs, rubs, or gallops.  Respiratory: Clear to auscultation bilaterally. GI: Soft, nontender, non-distended  MS: No edema; No deformity. Bruising at both radial sites. No hematoma. Right groin looks good. No hematoma Neuro:  Nonfocal  Psych: Normal affect   Labs High Sensitivity Troponin:   Recent Labs  Lab 10/27/23 0523 10/27/23 0720 10/27/23 1655 10/27/23 1917  TROPONINIHS 160* 201* 238* 843*     Chemistry Recent Labs  Lab  10/27/23 0523 10/27/23 1655 10/28/23 0243  NA 136  --  136  K 3.5  --  3.9  CL 105  --  104  CO2 22  --  23  GLUCOSE 109*  --  100*  BUN 17  --  10  CREATININE 0.59 0.73 0.62  CALCIUM 8.7*  --  8.5*  MG  --  1.9  --   PROT  --   --  5.5*  ALBUMIN  --   --  2.9*  AST  --   --  26  ALT  --   --  13  ALKPHOS  --   --  75  BILITOT  --   --  0.8  GFRNONAA >60 >60 >60  ANIONGAP 9  --  9    Lipids No results for input(s): "CHOL", "TRIG", "HDL", "LABVLDL", "LDLCALC", "CHOLHDL" in the last 168 hours.  Hematology Recent Labs  Lab 10/27/23 0523 10/27/23 1653 10/28/23 0243  WBC 8.6 9.2 9.6  RBC 4.73 4.50 4.29  HGB 16.1* 14.8 14.3  HCT 49.4* 44.8 42.6  MCV 104.4* 99.6 99.3  MCH 34.0 32.9 33.3  MCHC 32.6 33.0 33.6  RDW 12.8 13.0 13.0  PLT 218 227 213   Thyroid  No results for input(s): "  TSH", "FREET4" in the last 168 hours.  BNPNo results for input(s): "BNP", "PROBNP" in the last 168 hours.  DDimer No results for input(s): "DDIMER" in the last 168 hours.   Radiology  ECHOCARDIOGRAM COMPLETE Result Date: 10/28/2023    ECHOCARDIOGRAM REPORT   Patient Name:   Kathryn Cuevas Date of Exam: 10/28/2023 Medical Rec #:  [id number]   Height:       64.0 in Accession #:    [id number]  Weight:       106.0 lb Date of Birth:  1932-04-25   BSA:          1.494 m Patient Age:    89 years    BP:           125/58 mmHg Patient Gender: F           HR:           66 bpm. Exam Location:  Inpatient Procedure: 2D Echo (Both Spectral and Color Flow Doppler were utilized during            procedure). Indications:    NSTEMI  History:        Patient has prior history of Echocardiogram examinations, most                 recent 10/21/2017. COPD, Arrythmias:Tachycardia; Risk                 Factors:Current Smoker.  Sonographer:    Dione Franks RDCS Referring Phys: [id number] Debria Fang  Sonographer Comments: Image acquisition challenging due to respiratory motion. IMPRESSIONS  1. Left ventricular ejection fraction, by  estimation, is 45 to 50%. The left ventricle has mildly decreased function. The left ventricle demonstrates regional wall motion abnormalities (see scoring diagram/findings for description). There is mild asymmetric left ventricular hypertrophy of the basal-septal segment. Left ventricular diastolic parameters are consistent with Grade I diastolic dysfunction (impaired relaxation).  2. Right ventricular systolic function is normal. The right ventricular size is normal.  3. The mitral valve is degenerative. No evidence of mitral valve regurgitation. No evidence of mitral stenosis. Moderate mitral annular calcification.  4. The aortic valve is tricuspid. There is mild calcification of the aortic valve. Aortic valve regurgitation is mild. Aortic valve sclerosis/calcification is present, without any evidence of aortic stenosis.  5. The inferior vena cava is normal in size with <50% respiratory variability, suggesting right atrial pressure of 8 mmHg. FINDINGS  Left Ventricle: Left ventricular ejection fraction, by estimation, is 45 to 50%. The left ventricle has mildly decreased function. The left ventricle demonstrates regional wall motion abnormalities. The left ventricular internal cavity size was normal in size. There is mild asymmetric left ventricular hypertrophy of the basal-septal segment. Left ventricular diastolic parameters are consistent with Grade I diastolic dysfunction (impaired relaxation).  LV Wall Scoring: The apical septal segment is akinetic. The mid anteroseptal segment and apex are hypokinetic. Right Ventricle: The right ventricular size is normal. No increase in right ventricular wall thickness. Right ventricular systolic function is normal. Left Atrium: Left atrial size was normal in size. Right Atrium: Right atrial size was normal in size. Pericardium: There is no evidence of pericardial effusion. Mitral Valve: The mitral valve is degenerative in appearance. Moderate mitral annular calcification.  No evidence of mitral valve regurgitation. No evidence of mitral valve stenosis. Tricuspid Valve: The tricuspid valve is normal in structure. Tricuspid valve regurgitation is mild . No evidence of tricuspid stenosis. Aortic Valve: The aortic valve is tricuspid. There is  mild calcification of the aortic valve. Aortic valve regurgitation is mild. Aortic valve sclerosis/calcification is present, without any evidence of aortic stenosis. Pulmonic Valve: The pulmonic valve was normal in structure. Pulmonic valve regurgitation is trivial. No evidence of pulmonic stenosis. Aorta: The aortic root is normal in size and structure. Venous: The inferior vena cava is normal in size with less than 50% respiratory variability, suggesting right atrial pressure of 8 mmHg. IAS/Shunts: No atrial level shunt detected by color flow Doppler.  LEFT VENTRICLE PLAX 2D LVIDd:         4.00 cm     Diastology LVIDs:         2.20 cm     LV e' medial:    7.62 cm/s LV PW:         0.80 cm     LV E/e' medial:  7.6 LV IVS:        0.60 cm     LV e' lateral:   7.51 cm/s LVOT diam:     1.60 cm     LV E/e' lateral: 7.7 LV SV:         46 LV SV Index:   31 LVOT Area:     2.01 cm  LV Volumes (MOD) LV vol d, MOD A4C: 57.1 ml LV vol s, MOD A4C: 30.4 ml LV SV MOD A4C:     57.1 ml RIGHT VENTRICLE             IVC RV Basal diam:  2.30 cm     IVC diam: 1.90 cm RV S prime:     11.40 cm/s TAPSE (M-mode): 1.5 cm LEFT ATRIUM             Index        RIGHT ATRIUM           Index LA diam:        2.70 cm 1.81 cm/m   RA Area:     10.40 cm LA Vol (A2C):   32.3 ml 21.62 ml/m  RA Volume:   22.20 ml  14.86 ml/m LA Vol (A4C):   22.1 ml 14.80 ml/m LA Biplane Vol: 27.1 ml 18.14 ml/m  AORTIC VALVE LVOT Vmax:   114.00 cm/s LVOT Vmean:  75.200 cm/s LVOT VTI:    0.228 m  AORTA Ao Root diam: 3.10 cm Ao Asc diam:  2.90 cm MV E velocity: 57.60 cm/s MV A velocity: 108.00 cm/s  SHUNTS MV E/A ratio:  0.53         Systemic VTI:  0.23 m                             Systemic Diam: 1.60  cm Jules Oar MD Electronically signed by Jules Oar MD Signature Date/Time: 10/28/2023/9:19:05 AM    Final    CARDIAC CATHETERIZATION Result Date: 10/27/2023 Images from the original result were not included. Coronary angiography & intervention 10/27/2023: LM: Normal LAD: Moderately calcific prox-mid LAD 80-95% stenoses          Diag 2 ostial 70% stenosis Lcx: Mid 40% disease RCA: Proximal 80% stenosis LVEDP 4 mmHg Successful percutaneous coronary intervention prox-mid LAD        PTCA and overlapping stents placement 2.5 X 20 mm & 2.75 X 12 mm Synergy drug-eluting stents        Post dilatation with 2.75 & 3.0 mm [place] balloons up to 18 atm Unusually long and challenging procedure due need  to use multiple access sites, severe tortuosity and calcification in proximal/mid LAD necessitating the use of wires, balloons, use of guide liner, stents due to difficulty delivering. Contrast used: 260 cc Residual prox RCA stenosis. Will await recovery from this procedure and follow up renal function assessment tomorrow before decision on furthers management. Cody Das, MD  DG Chest 2 View Result Date: 10/27/2023 CLINICAL DATA:  Chest pain EXAM: CHEST - 2 VIEW COMPARISON:  02/28/2014 FINDINGS: Heart size and mediastinal contours are normal. Aortic atherosclerotic calcifications. Increased lung volumes. Diffuse coarsened interstitial markings noted. Right middle lobe is identified which may reflect atelectasis or airspace disease. No signs of pleural effusion or edema. Spondylosis noted throughout the thoracic spine. Chronic appearing right posterior seventh rib fracture. IMPRESSION: 1. Right middle lobe opacity which may reflect atelectasis or airspace disease. 2. Increased lung volumes and coarsened interstitial markings compatible with COPD. Electronically Signed   By: Kimberley Penman M.D.   On: 10/27/2023 05:35    Cardiac Studies LEFT HEART CATH AND CORONARY ANGIOGRAPHY  CORONARY STENT INTERVENTION    Conclusion  Coronary angiography & intervention 10/27/2023: LM: Normal LAD: Moderately calcific prox-mid LAD 80-95% stenoses          Diag 2 ostial 70% stenosis Lcx: Mid 40% disease RCA: Proximal 80% stenosis   LVEDP 4 mmHg      Successful percutaneous coronary intervention prox-mid LAD        PTCA and overlapping stents placement 2.5 X 20 mm & 2.75 X 12 mm Synergy drug-eluting stents        Post dilatation with 2.75 & 3.0 mm [place] balloons up to 18 atm     Unusually long and challenging procedure due need to use multiple access sites, severe tortuosity and calcification in proximal/mid LAD necessitating the use of wires, balloons, use of guide liner, stents due to difficulty delivering.   Contrast used: 260 cc Residual prox RCA stenosis. Will await recovery from this procedure and follow up renal function assessment tomorrow before decision on furthers management.     Manish Corliss Dies, MD  IMPRESSIONS     1. Left ventricular ejection fraction, by estimation, is 45 to 50%. The  left ventricle has mildly decreased function. The left ventricle  demonstrates regional wall motion abnormalities (see scoring  diagram/findings for description). There is mild  asymmetric left ventricular hypertrophy of the basal-septal segment. Left  ventricular diastolic parameters are consistent with Grade I diastolic  dysfunction (impaired relaxation).   2. Right ventricular systolic function is normal. The right ventricular  size is normal.   3. The mitral valve is degenerative. No evidence of mitral valve  regurgitation. No evidence of mitral stenosis. Moderate mitral annular  calcification.   4. The aortic valve is tricuspid. There is mild calcification of the  aortic valve. Aortic valve regurgitation is mild. Aortic valve  sclerosis/calcification is present, without any evidence of aortic  stenosis.   5. The inferior vena cava is normal in size with <50% respiratory  variability,  suggesting right atrial pressure of 8 mmHg.   Patient Profile   89 y.o. female with HLD admitted with NSTEMI  Assessment & Plan  NSTEMI. Peak troponin 843. Anterolateral ST-T changes on Ecg. EF 45-50%. S/p cath yesterday with PCI of the proximal to mid LAD with DES x 2. Prolonged difficult procedure due to calcification, difficult access. Did end up with excellent result. She does have significant stenosis in the proximal RCA. This is also calcified. Discussed staged PCI of  this vessel. Patient desires to be DC today and return later electively for PCI. I think this is reasonable to allow her some recovery. Currently on DAPT with ASA and Plavix. On high dose statin. Will add low dose Toprol XL. Should have sl Ntg to use PRN. Stable for DC today from our standpoint and will arrange close follow up in our office to discuss staged PCI of the RCA. This will likely need plaque modification with either atherectomy or lithotripsy given calcification HLD on statin.    For questions or updates, please contact [facility] Please consult [email or web address] for contact info under     Signed, Kymoni Monday [place], MD  10/28/2023, 10:06 AM

## 2023-10-28 NOTE — Progress Notes (Signed)
 Progress Note   Patient: Kathryn Cuevas GNF:[id number] DOB: [birth year] DOA: 10/27/2023     1 DOS: the patient was seen and examined on 10/28/2023   Brief hospital course:  Kathryn Cuevas is a 89 y.o. female with medical history significant of hypERkalemia, hyperlipidemia, ischemic optic neuropathy of the right eye, osteoarthritis of both hips, hypothyroidism, COPD,  history of hydronephrosis, microcytosis, lumbar radiculopathy, prediabetes who presented to the emergency department complaints of chest pain. Seen by cardiology. S/p cath yesterday with PCI of the proximal to mid LAD with DES x 2.  Assessment and Plan: NSTEMI- She is chest pain free. Troponin peaked at 843. S/p cath yesterday with PCI of the proximal to mid LAD with DES x 2  Cardiology follow up appreciated, she will need staged PCI of RCA which is planned as outpatient. Continue Aspirin, Plavix and statin, Continue beta blocker.  Hypertension: Continue metoprolol.  Hypothyroidism Continue  home dose Levothyroxine.  COPD: Wean supplemental O2. Continue bronchodilators.  Hyperlipidemia- Continue Crestor.  Hypocalcemia-corrected for low albumin.      Out of bed to chair. Incentive spirometry. Nursing supportive care. Fall, aspiration precautions. Diet:  Diet Orders (From admission, onward)     Start     Ordered   10/27/23 1708  Diet Heart Room service appropriate? Yes; Fluid consistency: Thin  Diet effective now       Question Answer Comment  Room service appropriate? Yes   Fluid consistency: Thin      10/27/23 1707           DVT prophylaxis: heparin injection 5,000 Units Start: 10/28/23 2000 SCD's Start: 10/27/23 1708  Level of care: Progressive Cardiac   Code Status: Full Code  Subjective: Patient is seen and examined today morning.. denies chest pain, shortness of breath. She ambulated with 3L O2 to maintain saturation greater than 90%.  Physical Exam: Vitals:   10/28/23 0700 10/28/23 1128  10/28/23 1558 10/28/23 2000  BP: (!) 125/58 131/69 (!) 122/58 (!) 109/45  Pulse: 70 65 67 65  Resp: 20 17 18 18   Temp: 98.2 F (36.8 C) 97.7 F (36.5 C) 97.9 F (36.6 C) 97.7 F (36.5 C)  TempSrc: Oral Oral Oral Oral  SpO2: 91% 93% 94% 92%  Weight:        General - Elderly Caucasian female, no apparent distress HEENT - PERRLA, EOMI, atraumatic head, non tender sinuses. Lung - Clear, basal rales, no rhonchi, wheezes. Heart - S1, S2 heard, no murmurs, rubs, trace pedal edema. Abdomen - Soft, non tender, bowel sounds good Neuro - Alert, awake and oriented x 3, non focal exam. Skin - Warm and dry.  Data Reviewed:      Latest Ref Rng & Units 10/28/2023    2:43 AM 10/27/2023    4:53 PM 10/27/2023    5:23 AM  CBC  WBC 4.0 - 10.5 K/uL 9.6  9.2  8.6   Hemoglobin 12.0 - 15.0 g/dL 84.6  96.2  95.2   Hematocrit 36.0 - 46.0 % 42.6  44.8  49.4   Platelets 150 - 400 K/uL 213  227  218       Latest Ref Rng & Units 10/28/2023    2:43 AM 10/27/2023    4:55 PM 10/27/2023    5:23 AM  BMP  Glucose 70 - 99 mg/dL 841   324   BUN 8 - 23 mg/dL 10   17   Creatinine 4.01 - 1.00 mg/dL 0.27  2.53  6.64   Sodium  135 - 145 mmol/L 136   136   Potassium 3.5 - 5.1 mmol/L 3.9   3.5   Chloride 98 - 111 mmol/L 104   105   CO2 22 - 32 mmol/L 23   22   Calcium 8.9 - 10.3 mg/dL 8.5   8.7    ECHOCARDIOGRAM COMPLETE Result Date: 10/28/2023    ECHOCARDIOGRAM REPORT   Patient Name:   Kathryn Cuevas Date of Exam: 10/28/2023 Medical Rec #:  [id number]   Height:       64.0 in Accession #:    [id number]  Weight:       106.0 lb Date of Birth:  [birth year]   BSA:          1.494 m Patient Age:    89 years    BP:           125/58 mmHg Patient Gender: F           HR:           66 bpm. Exam Location:  Inpatient Procedure: 2D Echo (Both Spectral and Color Flow Doppler were utilized during            procedure). Indications:    NSTEMI  History:        Patient has prior history of Echocardiogram examinations, most                 recent  10/21/2017. COPD, Arrythmias:Tachycardia; Risk                 Factors:Current Smoker.  Sonographer:    Dione Franks RDCS Referring Phys: [id number] Debria Fang  Sonographer Comments: Image acquisition challenging due to respiratory motion. IMPRESSIONS  1. Left ventricular ejection fraction, by estimation, is 45 to 50%. The left ventricle has mildly decreased function. The left ventricle demonstrates regional wall motion abnormalities (see scoring diagram/findings for description). There is mild asymmetric left ventricular hypertrophy of the basal-septal segment. Left ventricular diastolic parameters are consistent with Grade I diastolic dysfunction (impaired relaxation).  2. Right ventricular systolic function is normal. The right ventricular size is normal.  3. The mitral valve is degenerative. No evidence of mitral valve regurgitation. No evidence of mitral stenosis. Moderate mitral annular calcification.  4. The aortic valve is tricuspid. There is mild calcification of the aortic valve. Aortic valve regurgitation is mild. Aortic valve sclerosis/calcification is present, without any evidence of aortic stenosis.  5. The inferior vena cava is normal in size with <50% respiratory variability, suggesting right atrial pressure of 8 mmHg. FINDINGS  Left Ventricle: Left ventricular ejection fraction, by estimation, is 45 to 50%. The left ventricle has mildly decreased function. The left ventricle demonstrates regional wall motion abnormalities. The left ventricular internal cavity size was normal in size. There is mild asymmetric left ventricular hypertrophy of the basal-septal segment. Left ventricular diastolic parameters are consistent with Grade I diastolic dysfunction (impaired relaxation).  LV Wall Scoring: The apical septal segment is akinetic. The mid anteroseptal segment and apex are hypokinetic. Right Ventricle: The right ventricular size is normal. No increase in right ventricular wall thickness.  Right ventricular systolic function is normal. Left Atrium: Left atrial size was normal in size. Right Atrium: Right atrial size was normal in size. Pericardium: There is no evidence of pericardial effusion. Mitral Valve: The mitral valve is degenerative in appearance. Moderate mitral annular calcification. No evidence of mitral valve regurgitation. No evidence of mitral valve stenosis. Tricuspid Valve: The tricuspid valve is normal in structure.  Tricuspid valve regurgitation is mild . No evidence of tricuspid stenosis. Aortic Valve: The aortic valve is tricuspid. There is mild calcification of the aortic valve. Aortic valve regurgitation is mild. Aortic valve sclerosis/calcification is present, without any evidence of aortic stenosis. Pulmonic Valve: The pulmonic valve was normal in structure. Pulmonic valve regurgitation is trivial. No evidence of pulmonic stenosis. Aorta: The aortic root is normal in size and structure. Venous: The inferior vena cava is normal in size with less than 50% respiratory variability, suggesting right atrial pressure of 8 mmHg. IAS/Shunts: No atrial level shunt detected by color flow Doppler.  LEFT VENTRICLE PLAX 2D LVIDd:         4.00 cm     Diastology LVIDs:         2.20 cm     LV e' medial:    7.62 cm/s LV PW:         0.80 cm     LV E/e' medial:  7.6 LV IVS:        0.60 cm     LV e' lateral:   7.51 cm/s LVOT diam:     1.60 cm     LV E/e' lateral: 7.7 LV SV:         46 LV SV Index:   31 LVOT Area:     2.01 cm  LV Volumes (MOD) LV vol d, MOD A4C: 57.1 ml LV vol s, MOD A4C: 30.4 ml LV SV MOD A4C:     57.1 ml RIGHT VENTRICLE             IVC RV Basal diam:  2.30 cm     IVC diam: 1.90 cm RV S prime:     11.40 cm/s TAPSE (M-mode): 1.5 cm LEFT ATRIUM             Index        RIGHT ATRIUM           Index LA diam:        2.70 cm 1.81 cm/m   RA Area:     10.40 cm LA Vol (A2C):   32.3 ml 21.62 ml/m  RA Volume:   22.20 ml  14.86 ml/m LA Vol (A4C):   22.1 ml 14.80 ml/m LA Biplane Vol: 27.1  ml 18.14 ml/m  AORTIC VALVE LVOT Vmax:   114.00 cm/s LVOT Vmean:  75.200 cm/s LVOT VTI:    0.228 m  AORTA Ao Root diam: 3.10 cm Ao Asc diam:  2.90 cm MV E velocity: 57.60 cm/s MV A velocity: 108.00 cm/s  SHUNTS MV E/A ratio:  0.53         Systemic VTI:  0.23 m                             Systemic Diam: 1.60 cm Jules Oar MD Electronically signed by Jules Oar MD Signature Date/Time: 10/28/2023/9:19:05 AM    Final    CARDIAC CATHETERIZATION Result Date: 10/27/2023 Images from the original result were not included. Coronary angiography & intervention 10/27/2023: LM: Normal LAD: Moderately calcific prox-mid LAD 80-95% stenoses          Diag 2 ostial 70% stenosis Lcx: Mid 40% disease RCA: Proximal 80% stenosis LVEDP 4 mmHg Successful percutaneous coronary intervention prox-mid LAD        PTCA and overlapping stents placement 2.5 X 20 mm & 2.75 X 12 mm Synergy drug-eluting stents  Post dilatation with 2.75 & 3.0 mm [place] balloons up to 18 atm Unusually long and challenging procedure due need to use multiple access sites, severe tortuosity and calcification in proximal/mid LAD necessitating the use of wires, balloons, use of guide liner, stents due to difficulty delivering. Contrast used: 260 cc Residual prox RCA stenosis. Will await recovery from this procedure and follow up renal function assessment tomorrow before decision on furthers management. Cody Das, MD  DG Chest 2 View Result Date: 10/27/2023 CLINICAL DATA:  Chest pain EXAM: CHEST - 2 VIEW COMPARISON:  02/28/2014 FINDINGS: Heart size and mediastinal contours are normal. Aortic atherosclerotic calcifications. Increased lung volumes. Diffuse coarsened interstitial markings noted. Right middle lobe is identified which may reflect atelectasis or airspace disease. No signs of pleural effusion or edema. Spondylosis noted throughout the thoracic spine. Chronic appearing right posterior seventh rib fracture. IMPRESSION: 1. Right middle lobe  opacity which may reflect atelectasis or airspace disease. 2. Increased lung volumes and coarsened interstitial markings compatible with COPD. Electronically Signed   By: Kimberley Penman M.D.   On: 10/27/2023 05:35    Family Communication: Discussed with patient, she understand and agree. All questions answered.  Disposition: Status is: Inpatient Remains inpatient appropriate because: hypoxia, s/p stent  Planned Discharge Destination: Home with Home Health     Time spent: 40 minutes  Author: Aisha Hove, MD 10/28/2023 8:58 PM Secure chat 7am to 7pm For on call review [email or web address].

## 2023-10-28 NOTE — Plan of Care (Signed)
  Problem: Education: Goal: Understanding of cardiac disease, CV risk reduction, and recovery process will improve Outcome: Progressing   Problem: Cardiac: Goal: Ability to achieve and maintain adequate cardiovascular perfusion will improve Outcome: Progressing   Problem: Clinical Measurements: Goal: Cardiovascular complication will be avoided Outcome: Progressing   Problem: Skin Integrity: Goal: Risk for impaired skin integrity will decrease Outcome: Progressing

## 2023-10-28 NOTE — TOC CM/SW Note (Signed)
 Transition of Care [facility]) - Inpatient Brief Assessment   Patient Details  Name: Kathryn Cuevas MRN: [id number] Date of Birth: May 10, 1932  Transition of Care [facility]) CM/SW Contact:    Juliane Och, LCSW Phone Number: 10/28/2023, 9:36 AM   Clinical Narrative:  9:36 AM Per chart review, patient resides at home alone. Patient has a PCP and insurance. Patient does not have SNF/HH/DME history. No TOC needs were identified at this time. TOC will continue to follow and be available to assist.  Transition of Care Asessment: Insurance and Status: Insurance coverage has been reviewed Patient has primary care physician: Yes Home environment has been reviewed: Private Residence Prior level of function:: N/A Prior/Current Home Services: No current home services Social Drivers of Health Review: SDOH reviewed no interventions necessary Readmission risk has been reviewed: Yes Transition of care needs: no transition of care needs at this time

## 2023-10-28 NOTE — Progress Notes (Signed)
  Echocardiogram 2D Echocardiogram has been performed.  Kathryn Cuevas 10/28/2023, 9:04 AM

## 2023-10-28 NOTE — Progress Notes (Signed)
 Patient with 12 in TR band at beginning of shift. Of note, patient had oozing at 1845 on prior shift, and 4 units of air was added back to TR band.    At 1945 patient found with old drainage but no new apparent oozing and a lot of bruising surrounding TR band. 2 units of air taken out. At 2000, new bleeding noted. 2 units of air put back in.   At 2020, right arm immediately below TR band felt hard and tight compared to other arm. Pressure held for 5 minutes.  At 2050, arm below TR band again felt hard. Pressure held for five minutes. Pressure held again for five minutes at 2115. Physician paged at 2130 to assess patient and hematoma. Dr. Sanjuana Crutch to bedside at 2135.   TR band taken off, cleaned up and reapplied. 8 units of air back in. APTT drawn with further instructions to comes based on results.

## 2023-10-29 ENCOUNTER — Other Ambulatory Visit (HOSPITAL_COMMUNITY): Payer: Self-pay

## 2023-10-29 DIAGNOSIS — E039 Hypothyroidism, unspecified: Secondary | ICD-10-CM | POA: Diagnosis not present

## 2023-10-29 DIAGNOSIS — I2510 Atherosclerotic heart disease of native coronary artery without angina pectoris: Secondary | ICD-10-CM

## 2023-10-29 DIAGNOSIS — Z9861 Coronary angioplasty status: Secondary | ICD-10-CM

## 2023-10-29 DIAGNOSIS — J449 Chronic obstructive pulmonary disease, unspecified: Secondary | ICD-10-CM | POA: Diagnosis not present

## 2023-10-29 DIAGNOSIS — I214 Non-ST elevation (NSTEMI) myocardial infarction: Secondary | ICD-10-CM | POA: Diagnosis not present

## 2023-10-29 LAB — LIPOPROTEIN A (LPA): Lipoprotein (a): 35.6 nmol/L — ABNORMAL HIGH

## 2023-10-29 MED ORDER — NITROGLYCERIN 0.4 MG SL SUBL
0.4000 mg | SUBLINGUAL_TABLET | SUBLINGUAL | 1 refills | Status: AC | PRN
  Filled 2023-10-29: qty 25, 7d supply, fill #0

## 2023-10-29 MED ORDER — LOSARTAN POTASSIUM 25 MG PO TABS: 12.5000 mg | ORAL_TABLET | Freq: Every day | ORAL | Status: DC

## 2023-10-29 MED ORDER — METOPROLOL SUCCINATE ER 25 MG PO TB24
25.0000 mg | ORAL_TABLET | Freq: Every day | ORAL | 1 refills | Status: DC
  Filled 2023-10-29: qty 30, 30d supply, fill #0

## 2023-10-29 MED ORDER — LOSARTAN POTASSIUM 25 MG PO TABS
12.5000 mg | ORAL_TABLET | Freq: Every day | ORAL | 1 refills | Status: DC
  Filled 2023-10-29: qty 15, 30d supply, fill #0

## 2023-10-29 MED ORDER — CLOPIDOGREL BISULFATE 75 MG PO TABS
75.0000 mg | ORAL_TABLET | Freq: Every day | ORAL | 1 refills | Status: DC
  Filled 2023-10-29: qty 30, 30d supply, fill #0

## 2023-10-29 MED ORDER — ROSUVASTATIN CALCIUM 20 MG PO TABS
20.0000 mg | ORAL_TABLET | Freq: Every day | ORAL | 1 refills | Status: DC
  Filled 2023-10-29: qty 30, 30d supply, fill #0

## 2023-10-29 MED ORDER — ASPIRIN 81 MG PO TBEC
81.0000 mg | DELAYED_RELEASE_TABLET | Freq: Every day | ORAL | 12 refills | Status: DC
  Filled 2023-10-29: qty 30, 30d supply, fill #0

## 2023-10-29 NOTE — Plan of Care (Signed)
  Problem: Education: Goal: Understanding of cardiac disease, CV risk reduction, and recovery process will improve Outcome: Adequate for Discharge Goal: Individualized Educational Video(s) Outcome: Adequate for Discharge   Problem: Activity: Goal: Ability to tolerate increased activity will improve Outcome: Adequate for Discharge   Problem: Cardiac: Goal: Ability to achieve and maintain adequate cardiovascular perfusion will improve Outcome: Adequate for Discharge   Problem: Health Behavior/Discharge Planning: Goal: Ability to safely manage health-related needs after discharge will improve Outcome: Adequate for Discharge   Problem: Education: Goal: Knowledge of General Education information will improve Description: Including pain rating scale, medication(s)/side effects and non-pharmacologic comfort measures Outcome: Adequate for Discharge   Problem: Health Behavior/Discharge Planning: Goal: Ability to manage health-related needs will improve Outcome: Adequate for Discharge   Problem: Clinical Measurements: Goal: Ability to maintain clinical measurements within normal limits will improve Outcome: Adequate for Discharge Goal: Will remain free from infection Outcome: Adequate for Discharge Goal: Diagnostic test results will improve Outcome: Adequate for Discharge Goal: Respiratory complications will improve Outcome: Adequate for Discharge Goal: Cardiovascular complication will be avoided Outcome: Adequate for Discharge   Problem: Activity: Goal: Risk for activity intolerance will decrease Outcome: Adequate for Discharge   Problem: Nutrition: Goal: Adequate nutrition will be maintained Outcome: Adequate for Discharge   Problem: Coping: Goal: Level of anxiety will decrease Outcome: Adequate for Discharge   Problem: Elimination: Goal: Will not experience complications related to bowel motility Outcome: Adequate for Discharge Goal: Will not experience complications  related to urinary retention Outcome: Adequate for Discharge   Problem: Pain Managment: Goal: General experience of comfort will improve and/or be controlled Outcome: Adequate for Discharge   Problem: Safety: Goal: Ability to remain free from injury will improve Outcome: Adequate for Discharge   Problem: Skin Integrity: Goal: Risk for impaired skin integrity will decrease Outcome: Adequate for Discharge   Problem: Education: Goal: Understanding of CV disease, CV risk reduction, and recovery process will improve Outcome: Adequate for Discharge Goal: Individualized Educational Video(s) Outcome: Adequate for Discharge   Problem: Activity: Goal: Ability to return to baseline activity level will improve Outcome: Adequate for Discharge   Problem: Cardiovascular: Goal: Ability to achieve and maintain adequate cardiovascular perfusion will improve Outcome: Adequate for Discharge Goal: Vascular access site(s) Level 0-1 will be maintained Outcome: Adequate for Discharge   Problem: Health Behavior/Discharge Planning: Goal: Ability to safely manage health-related needs after discharge will improve Outcome: Adequate for Discharge

## 2023-10-29 NOTE — Discharge Summary (Signed)
 Physician Discharge Summary   Patient: Kathryn Cuevas MRN: [id number] DOB: Nov 16, 1931  Admit date:     10/27/2023  Discharge date: 10/29/23  Discharge Physician: Aisha Hove   PCP: Jimmey Mould, MD   Recommendations at discharge:    PCP follow up in 1 week. Cardiology follow up as scheduled.  Discharge Diagnoses: Principal Problem:   NSTEMI (non-ST elevated myocardial infarction) (HCC) Active Problems:   Chronic obstructive pulmonary disease (HCC)   Hypothyroidism   Prediabetes   Lumbar radiculopathy   ACS (acute coronary syndrome) (HCC)   Hypocalcemia   Tobacco abuse  Resolved Problems:   * No resolved hospital problems. *  Hospital Course: Kathryn Cuevas is a 89 y.o. female with medical history significant of hypERkalemia, hyperlipidemia, ischemic optic neuropathy of the right eye, osteoarthritis of both hips, hypothyroidism, COPD,  history of hydronephrosis, microcytosis, lumbar radiculopathy, prediabetes who presented to the emergency department complaints of chest pain. Seen by cardiology. S/p cath 10/27/23/ with PCI of the proximal to mid LAD with DES x 2.   Assessment and Plan: NSTEMI- She is chest pain free. Troponin peaked at 843. S/p cath with PCI of the proximal to mid LAD with DES x 2  Cardiology follow up appreciated, she will need staged PCI of RCA which is planned as outpatient. Continue Aspirin, Plavix and statin, ARB, beta blocker, crestor. Scripts sent to pharmacy. Advised to follow up with PCP, Cardiology upon discharge as instructed. Patient, son understand and agree with discharge plan.   Hypertension: Continue metoprolol.   Hypothyroidism Continue  home dose Levothyroxine.   COPD: She is requiring 2L supplemental O2 to maintain saturation above 90%. Home O2 evaluation done. She will go home with HH, 2L continuous O2 at home. Continue bronchodilators.   Hyperlipidemia- Continue Crestor.   Hypocalcemia-corrected for low  albumin.  Debility- PT/ OT - advised HH services.      Consultants: Cardiology Procedures performed: Cardiac cath with PCI of the proximal to mid LAD with DES x 2   Disposition: Home health Diet recommendation:  Discharge Diet Orders (From admission, onward)     Start     Ordered   10/29/23 0000  Diet - low sodium heart healthy        10/29/23 1305           Cardiac diet DISCHARGE MEDICATION: Allergies as of 10/29/2023       Reactions   Levothyroxine Other (See Comments)   Shoulder/neck pain. Can tolerate Synthroid.    Augmentin [amoxicillin-pot Clavulanate] Rash   Sulfa Antibiotics Rash        Medication List     TAKE these medications    aspirin EC 81 MG tablet Take 1 tablet (81 mg total) by mouth daily. Swallow whole. Start taking on: October 30, 2023   clopidogrel 75 MG tablet Commonly known as: PLAVIX Take 1 tablet (75 mg total) by mouth daily with breakfast. Start taking on: October 30, 2023   losartan 25 MG tablet Commonly known as: COZAAR Take 0.5 tablets (12.5 mg total) by mouth daily.   metoprolol succinate 25 MG 24 hr tablet Commonly known as: TOPROL-XL Take 1 tablet (25 mg total) by mouth daily. Start taking on: October 30, 2023   nitroGLYCERIN 0.4 MG SL tablet Commonly known as: NITROSTAT Place 1 tablet (0.4 mg total) under the tongue every 5 (five) minutes x 3 doses as needed for chest pain.   rosuvastatin 20 MG tablet Commonly known as: CRESTOR Take 1 tablet (20  mg total) by mouth daily. Start taking on: October 30, 2023   Synthroid 88 MCG tablet Generic drug: levothyroxine Take 88 mcg by mouth daily.               Durable Medical Equipment  (From admission, onward)           Start     Ordered   10/29/23 1131  For home use only DME oxygen  Once       Comments: Diagnosis- COPD  Question Answer Comment  Length of Need Lifetime   Mode or (Route) Nasal cannula   Liters per Minute 2   Frequency Continuous (stationary and portable  oxygen unit needed)   Oxygen conserving device Yes   Oxygen delivery system Gas      10/29/23 1131            Follow-up Information     Jimmey Mould, MD Follow up in 1 week(s).   Specialty: Family Medicine Contact information: [address] [place] [address] [phone]         [facility] Follow up.   Why: Home oxygen Contact information: [address] [place] [address] [phone]                Discharge Exam: Filed Weights   10/27/23 0458  Weight: 48.1 kg      10/29/2023   10:37 AM 10/29/2023    8:08 AM 10/29/2023    7:58 AM  Vitals with BMI  Height  5\' 4"    Systolic 137  132  Diastolic 58  65  Pulse 63  74    General - Elderly Caucasian female, no apparent distress HEENT - PERRLA, EOMI, atraumatic head, non tender sinuses. Lung - Clear, basal rales, no rhonchi, wheezes. Heart - S1, S2 heard, no murmurs, rubs, trace pedal edema. Abdomen - Soft, non tender, bowel sounds good Neuro - Alert, awake and oriented x 3, non focal exam. Skin - Warm and dry.    Condition at discharge: stable  The results of significant diagnostics from this hospitalization (including imaging, microbiology, ancillary and laboratory) are listed below for reference.   Imaging Studies: ECHOCARDIOGRAM COMPLETE Result Date: 10/28/2023    ECHOCARDIOGRAM REPORT   Patient Name:   Kathryn Cuevas Date of Exam: 10/28/2023 Medical Rec #:  [id number]   Height:       64.0 in Accession #:    [id number]  Weight:       106.0 lb Date of Birth:  [birth year]   BSA:          1.494 m Patient Age:    89 years    BP:           125/58 mmHg Patient Gender: F           HR:           66 bpm. Exam Location:  Inpatient Procedure: 2D Echo (Both Spectral and Color Flow Doppler were utilized during            procedure). Indications:    NSTEMI  History:        Patient has prior history of Echocardiogram examinations, most                 recent 10/21/2017. COPD,  Arrythmias:Tachycardia; Risk                 Factors:Current Smoker.  Sonographer:    Dione Franks RDCS Referring Phys: [id number] Garen Juneau  JOHNSON  Sonographer Comments: Image acquisition challenging due to respiratory motion. IMPRESSIONS  1. Left ventricular ejection fraction, by estimation, is 45 to 50%. The left ventricle has mildly decreased function. The left ventricle demonstrates regional wall motion abnormalities (see scoring diagram/findings for description). There is mild asymmetric left ventricular hypertrophy of the basal-septal segment. Left ventricular diastolic parameters are consistent with Grade I diastolic dysfunction (impaired relaxation).  2. Right ventricular systolic function is normal. The right ventricular size is normal.  3. The mitral valve is degenerative. No evidence of mitral valve regurgitation. No evidence of mitral stenosis. Moderate mitral annular calcification.  4. The aortic valve is tricuspid. There is mild calcification of the aortic valve. Aortic valve regurgitation is mild. Aortic valve sclerosis/calcification is present, without any evidence of aortic stenosis.  5. The inferior vena cava is normal in size with <50% respiratory variability, suggesting right atrial pressure of 8 mmHg. FINDINGS  Left Ventricle: Left ventricular ejection fraction, by estimation, is 45 to 50%. The left ventricle has mildly decreased function. The left ventricle demonstrates regional wall motion abnormalities. The left ventricular internal cavity size was normal in size. There is mild asymmetric left ventricular hypertrophy of the basal-septal segment. Left ventricular diastolic parameters are consistent with Grade I diastolic dysfunction (impaired relaxation).  LV Wall Scoring: The apical septal segment is akinetic. The mid anteroseptal segment and apex are hypokinetic. Right Ventricle: The right ventricular size is normal. No increase in right ventricular wall thickness. Right ventricular  systolic function is normal. Left Atrium: Left atrial size was normal in size. Right Atrium: Right atrial size was normal in size. Pericardium: There is no evidence of pericardial effusion. Mitral Valve: The mitral valve is degenerative in appearance. Moderate mitral annular calcification. No evidence of mitral valve regurgitation. No evidence of mitral valve stenosis. Tricuspid Valve: The tricuspid valve is normal in structure. Tricuspid valve regurgitation is mild . No evidence of tricuspid stenosis. Aortic Valve: The aortic valve is tricuspid. There is mild calcification of the aortic valve. Aortic valve regurgitation is mild. Aortic valve sclerosis/calcification is present, without any evidence of aortic stenosis. Pulmonic Valve: The pulmonic valve was normal in structure. Pulmonic valve regurgitation is trivial. No evidence of pulmonic stenosis. Aorta: The aortic root is normal in size and structure. Venous: The inferior vena cava is normal in size with less than 50% respiratory variability, suggesting right atrial pressure of 8 mmHg. IAS/Shunts: No atrial level shunt detected by color flow Doppler.  LEFT VENTRICLE PLAX 2D LVIDd:         4.00 cm     Diastology LVIDs:         2.20 cm     LV e' medial:    7.62 cm/s LV PW:         0.80 cm     LV E/e' medial:  7.6 LV IVS:        0.60 cm     LV e' lateral:   7.51 cm/s LVOT diam:     1.60 cm     LV E/e' lateral: 7.7 LV SV:         46 LV SV Index:   31 LVOT Area:     2.01 cm  LV Volumes (MOD) LV vol d, MOD A4C: 57.1 ml LV vol s, MOD A4C: 30.4 ml LV SV MOD A4C:     57.1 ml RIGHT VENTRICLE             IVC RV Basal diam:  2.30 cm  IVC diam: 1.90 cm RV S prime:     11.40 cm/s TAPSE (M-mode): 1.5 cm LEFT ATRIUM             Index        RIGHT ATRIUM           Index LA diam:        2.70 cm 1.81 cm/m   RA Area:     10.40 cm LA Vol (A2C):   32.3 ml 21.62 ml/m  RA Volume:   22.20 ml  14.86 ml/m LA Vol (A4C):   22.1 ml 14.80 ml/m LA Biplane Vol: 27.1 ml 18.14 ml/m   AORTIC VALVE LVOT Vmax:   114.00 cm/s LVOT Vmean:  75.200 cm/s LVOT VTI:    0.228 m  AORTA Ao Root diam: 3.10 cm Ao Asc diam:  2.90 cm MV E velocity: 57.60 cm/s MV A velocity: 108.00 cm/s  SHUNTS MV E/A ratio:  0.53         Systemic VTI:  0.23 m                             Systemic Diam: 1.60 cm Jules Oar MD Electronically signed by Jules Oar MD Signature Date/Time: 10/28/2023/9:19:05 AM    Final    CARDIAC CATHETERIZATION Result Date: 10/27/2023 Images from the original result were not included. Coronary angiography & intervention 10/27/2023: LM: Normal LAD: Moderately calcific prox-mid LAD 80-95% stenoses          Diag 2 ostial 70% stenosis Lcx: Mid 40% disease RCA: Proximal 80% stenosis LVEDP 4 mmHg Successful percutaneous coronary intervention prox-mid LAD        PTCA and overlapping stents placement 2.5 X 20 mm & 2.75 X 12 mm Synergy drug-eluting stents        Post dilatation with 2.75 & 3.0 mm  balloons up to 18 atm Unusually long and challenging procedure due need to use multiple access sites, severe tortuosity and calcification in proximal/mid LAD necessitating the use of wires, balloons, use of guide liner, stents due to difficulty delivering. Contrast used: 260 cc Residual prox RCA stenosis. Will await recovery from this procedure and follow up renal function assessment tomorrow before decision on furthers management. Cody Das, MD  DG Chest 2 View Result Date: 10/27/2023 CLINICAL DATA:  Chest pain EXAM: CHEST - 2 VIEW COMPARISON:  02/28/2014 FINDINGS: Heart size and mediastinal contours are normal. Aortic atherosclerotic calcifications. Increased lung volumes. Diffuse coarsened interstitial markings noted. Right middle lobe is identified which may reflect atelectasis or airspace disease. No signs of pleural effusion or edema. Spondylosis noted throughout the thoracic spine. Chronic appearing right posterior seventh rib fracture. IMPRESSION: 1. Right middle lobe opacity which may  reflect atelectasis or airspace disease. 2. Increased lung volumes and coarsened interstitial markings compatible with COPD. Electronically Signed   By: Kimberley Penman M.D.   On: 10/27/2023 05:35    Microbiology: Results for orders placed or performed during the hospital encounter of 09/25/19  Urine culture     Status: Abnormal   Collection Time: 09/25/19  7:38 AM   Specimen: Urine, Clean Catch  Result Value Ref Range Status   Specimen Description   Final    URINE, CLEAN CATCH Performed at [facility], [address] [place], [place] [address]    Special Requests   Final    NONE Performed at [facility], [address] [place], [place] [address]  Culture >=100,000 COLONIES/mL ESCHERICHIA COLI (A)  Final   Report Status 09/28/2019 FINAL  Final   Organism ID, Bacteria ESCHERICHIA COLI (A)  Final      Susceptibility   Escherichia coli - MIC*    AMPICILLIN <=2 SENSITIVE Sensitive     CEFAZOLIN <=4 SENSITIVE Sensitive     CEFTRIAXONE <=1 SENSITIVE Sensitive     CIPROFLOXACIN <=0.25 SENSITIVE Sensitive     GENTAMICIN <=1 SENSITIVE Sensitive     IMIPENEM 0.5 SENSITIVE Sensitive     NITROFURANTOIN  <=16 SENSITIVE Sensitive     TRIMETH/SULFA <=20 SENSITIVE Sensitive     AMPICILLIN/SULBACTAM <=2 SENSITIVE Sensitive     PIP/TAZO <=4 SENSITIVE Sensitive     * >=100,000 COLONIES/mL ESCHERICHIA COLI    Labs: CBC: Recent Labs  Lab 10/27/23 0523 10/27/23 1653 10/28/23 0243  WBC 8.6 9.2 9.6  HGB 16.1* 14.8 14.3  HCT 49.4* 44.8 42.6  MCV 104.4* 99.6 99.3  PLT 218 227 213   Basic Metabolic Panel: Recent Labs  Lab 10/27/23 0523 10/27/23 1655 10/28/23 0243  NA 136  --  136  K 3.5  --  3.9  CL 105  --  104  CO2 22  --  23  GLUCOSE 109*  --  100*  BUN 17  --  10  CREATININE 0.59 0.73 0.62  CALCIUM 8.7*  --  8.5*  MG  --  1.9  --   PHOS  --  3.6  --    Liver Function Tests: Recent Labs  Lab 10/28/23 0243  AST 26  ALT 13   ALKPHOS 75  BILITOT 0.8  PROT 5.5*  ALBUMIN 2.9*   CBG: No results for input(s): "GLUCAP" in the last 168 hours.  Discharge time spent: 35 minutes.  Signed: Aisha Hove, MD [facility] 10/29/2023

## 2023-10-29 NOTE — Progress Notes (Signed)
 Nurse requested Mobility Specialist to perform oxygen saturation test with pt which includes removing pt from oxygen both at rest and while ambulating.  Below are the results from that testing.     Patient Saturations on Room Air at Rest = spO2 87%  Patient Saturations on Room Air while Ambulating = sp02 86% .    Patient Saturations on 2 Liters of oxygen while Ambulating = sp02 91%> .  At end of testing pt left in room on 2  Liters of oxygen.  Reported results to nurse.    Janit Meline Mobility Specialist Please contact via SecureChat or [employer] [phone]

## 2023-10-29 NOTE — Evaluation (Signed)
 Occupational Therapy Evaluation Patient Details Name: Kathryn Cuevas MRN: [id number] DOB: January 06, 1932 Today's Date: 10/29/2023   History of Present Illness   89 y.o. female with medical history significant of hypERkalemia, hyperlipidemia, ischemic optic neuropathy of the right eye, osteoarthritis of both hips, hypothyroidism, COPD,  history of hydronephrosis, microcytosis, lumbar radiculopathy, prediabetes who presented to the emergency department complaints of chest pain associated with dyspnea     Clinical Impressions Patient admitted for the diagnosis above.  At the time of eval, OT orders existed, now provider discontinued OT orders.  From an OT perspective, the patient is very close to her baseline for in room mobility at a RW level, and ADL completion at a sit to stand level.  OT had the O2 off the entire time, and she saturated between 91 and 93% throughout.  Patient left sitting EOB eating lunch and RN notified no post acute OT needs.  Patient's son will be staying with her initially to ensure safety.       If plan is discharge home, recommend the following:   Assist for transportation     Functional Status Assessment   Patient has not had a recent decline in their functional status     Equipment Recommendations   None recommended by OT     Recommendations for Other Services         Precautions/Restrictions   Precautions Precautions: Fall Precaution/Restrictions Comments: watch O2 Restrictions Weight Bearing Restrictions Per Provider Order: No     Mobility Bed Mobility Overal bed mobility: Modified Independent               Patient Response: Cooperative  Transfers Overall transfer level: Needs assistance   Transfers: Sit to/from Stand Sit to Stand: Modified independent (Device/Increase time)                  Balance Overall balance assessment: Needs assistance Sitting-balance support: Feet supported Sitting balance-Leahy Scale: Good      Standing balance support: Reliant on assistive device for balance Standing balance-Leahy Scale: Fair                             ADL either performed or assessed with clinical judgement   ADL Overall ADL's : At baseline                                             Vision Patient Visual Report: No change from baseline       Perception Perception: Not tested       Praxis Praxis: Not tested       Pertinent Vitals/Pain Pain Assessment Pain Assessment: No/denies pain     Extremity/Trunk Assessment Upper Extremity Assessment Upper Extremity Assessment: Overall WFL for tasks assessed   Lower Extremity Assessment Lower Extremity Assessment: Defer to PT evaluation   Cervical / Trunk Assessment Cervical / Trunk Assessment: Kyphotic   Communication     Cognition Arousal: Alert Behavior During Therapy: WFL for tasks assessed/performed Cognition: No apparent impairments                                       Cueing  General Comments          Exercises     Shoulder Instructions  Home Living Family/patient expects to be discharged to:: Private residence Living Arrangements: Alone Available Help at Discharge: Family Type of Home: House Home Access: Stairs to enter [employer] of Steps: 2 Entrance Stairs-Rails: None Home Layout: Two level;Able to live on main level with bedroom/bathroom   Alternate Level Stairs-Rails: Right Bathroom Shower/Tub: Arts development officer Toilet: Handicapped height Bathroom Accessibility: Yes   Home Equipment: Agricultural consultant (2 wheels);Cane - single point;Grab bars - tub/shower;Shower seat - built in   Additional Comments: guest rooms upstairs      Prior Functioning/Environment Prior Level of Function : Independent/Modified Independent             Mobility Comments: uses RW for ambulation ADLs Comments: seat for showering, neighbor assists with iADL and  community mobility.  Son will be staying for a day or two post discharge.    OT Problem List: Decreased activity tolerance   OT Treatment/Interventions:        OT Goals(Current goals can be found in the care plan section)   Acute Rehab OT Goals Patient Stated Goal: Return home OT Goal Formulation: With patient Time For Goal Achievement: 11/06/23 Potential to Achieve Goals: Good   OT Frequency:       Co-evaluation              AM-PAC OT "6 Clicks" Daily Activity     Outcome Measure Help from another person eating meals?: None Help from another person taking care of personal grooming?: None Help from another person toileting, which includes using toliet, bedpan, or urinal?: None Help from another person bathing (including washing, rinsing, drying)?: None Help from another person to put on and taking off regular upper body clothing?: None Help from another person to put on and taking off regular lower body clothing?: None 6 Click Score: 24   End of Session Equipment Utilized During Treatment: Gait belt;Rolling walker (2 wheels) Nurse Communication: Mobility status  Activity Tolerance: Patient tolerated treatment well Patient left: in bed;with call bell/phone within reach;with family/visitor present  OT Visit Diagnosis: Unsteadiness on feet (R26.81)                Time: 6213-0865 OT Time Calculation (min): 22 min Charges:  OT General Charges $OT Visit: 1 Visit OT Evaluation $OT Eval Moderate Complexity: 1 Mod  10/29/2023  RP, OTR/L  Acute Rehabilitation Services  Office:  [phone]   Benjamen Brand 10/29/2023, 12:33 PM

## 2023-10-29 NOTE — TOC Transition Note (Signed)
 Transition of Care [facility]) - Discharge Note   Patient Details  Name: Kathryn Cuevas MRN: [id number] Date of Birth: [birth year]  Transition of Care [facility]) CM/SW Contact:  Jeani Mill, RN Phone Number: 10/29/2023, 2:00 PM   Clinical Narrative:    Patient stable for discharge.  Patient has a neighbor who takes her to apts and gets her groceries.  Patient is agreeable to home health and oxygen.  Notified Mitch with [employer] of oxygen order.  This RNCM offered choice for Home Health, Kathryn Cuevas states she has no preference, RNCM made referral to [facility] with Gasper Karst, He is able to take referral.   Son will transport home.   Final next level of care: Home w Home Health Services Barriers to Discharge: Barriers Resolved   Patient Goals and CMS Choice Patient states their goals for this hospitalization and ongoing recovery are:: return home CMS Medicare.gov Compare Post Acute Care list provided to:: Patient Choice offered to / list presented to : Patient      Discharge Placement               Home        Discharge Plan and Services Additional resources added to the After Visit Summary for                  DME Arranged: Oxygen DME Agency: [employer] Date DME Agency Contacted: 10/29/23 Time DME Agency Contacted: 1211 Representative spoke with at DME Agency: Harriet Limber HH Arranged: PT HH Agency: [facility] Date [facility] Agency Contacted: 10/29/23 Time HH Agency Contacted: 1400 Representative spoke with at [facility] Agency: Randel Buss  Social Drivers of Health (SDOH) Interventions SDOH Screenings   Food Insecurity: No Food Insecurity (10/27/2023)  Housing: Low Risk  (10/27/2023)  Transportation Needs: No Transportation Needs (10/27/2023)  Utilities: Not At Risk (10/27/2023)  Social Connections: Socially Isolated (10/28/2023)  Tobacco Use: High Risk (10/27/2023)     Readmission Risk Interventions    10/29/2023    2:00 PM  Readmission Risk Prevention Plan  Post Dischage Appt Complete   Medication Screening Complete  Transportation Screening Complete

## 2023-10-29 NOTE — Evaluation (Signed)
 Physical Therapy Evaluation Patient Details Name: Kathryn Cuevas MRN: [id number] DOB: 1931-09-22 Today's Date: 10/29/2023  History of Present Illness  89 y.o. female with medical history significant of hypERkalemia, hyperlipidemia, ischemic optic neuropathy of the right eye, osteoarthritis of both hips, hypothyroidism, COPD,  history of hydronephrosis, microcytosis, lumbar radiculopathy, prediabetes who presented to the emergency department complaints of chest pain associated with dyspnea.   Clinical Impression  Kathryn Cuevas is 89 y.o. female admitted with above HPI and diagnosis. Patient is currently limited by functional impairments below (see PT problem list). Patient lives alone and is mod ind with RW at baseline. Currently pt completing bed mobility at Mod ind level and transfers with RW at mod ind with use of hand to rise and lower. Pt amb ~180' with RW, 2 standing breaks to assess SpO2. Pt dropping to 87% on RA but with standing break recovered to 91% within 5-10 seconds. Suspect could be monitor error due to use of hands for RW support. However pt did have increased SOB with longer distance. EOS pt returned to bed and supine. Patient will benefit from continued skilled PT interventions to address impairments and progress independence with mobility, recommending HHPT follow up. Acute PT will follow and progress as able.        If plan is discharge home, recommend the following: A little help with walking and/or transfers;A little help with bathing/dressing/bathroom;Assistance with cooking/housework;Assist for transportation;Help with stairs or ramp for entrance   Can travel by private vehicle        Equipment Recommendations None recommended by PT  Recommendations for Other Services       Functional Status Assessment Patient has had a recent decline in their functional status and demonstrates the ability to make significant improvements in function in a reasonable and predictable amount of  time.     Precautions / Restrictions Precautions Precautions: Fall Precaution/Restrictions Comments: watch O2 Restrictions Weight Bearing Restrictions Per Provider Order: No      Mobility  Bed Mobility Overal bed mobility: Modified Independent             General bed mobility comments: use of bed features and extra time    Transfers Overall transfer level: Needs assistance Equipment used: Rolling walker (2 wheels) Transfers: Sit to/from Stand Sit to Stand: Modified independent (Device/Increase time)                Ambulation/Gait Ambulation/Gait assistance: Contact guard assist, Supervision Gait Distance (Feet): 180 Feet Assistive device: Rolling walker (2 wheels) Gait Pattern/deviations: Step-through pattern, Decreased stride length, Shuffle, Trunk flexed Gait velocity: decr        Stairs            Wheelchair Mobility     Tilt Bed    Modified Rankin (Stroke Patients Only)       Balance Overall balance assessment: Needs assistance Sitting-balance support: Feet supported Sitting balance-Leahy Scale: Good     Standing balance support: Bilateral upper extremity supported, During functional activity Standing balance-Leahy Scale: Fair                               Pertinent Vitals/Pain Pain Assessment Pain Assessment: No/denies pain    Home Living Family/patient expects to be discharged to:: Private residence Living Arrangements: Alone Available Help at Discharge: Family Type of Home: House Home Access: Stairs to enter Entrance Stairs-Rails: None Entrance Stairs-Number of Steps: 2   Home Layout: Two level;Able  to live on main level with bedroom/bathroom Home Equipment: Rolling Walker (2 wheels);Cane - single point;Grab bars - tub/shower;Shower seat - built in Additional Comments: guest rooms upstairs    Prior Function Prior Level of Function : Independent/Modified Independent             Mobility Comments: uses  RW for ambulation ADLs Comments: seat for showering, neighbor assists with iADL and community mobility.  Son will be staying for a day or two post discharge.     Extremity/Trunk Assessment   Upper Extremity Assessment Upper Extremity Assessment: Overall WFL for tasks assessed    Lower Extremity Assessment Lower Extremity Assessment: Defer to PT evaluation    Cervical / Trunk Assessment Cervical / Trunk Assessment: Kyphotic  Communication        Cognition Arousal: Alert Behavior During Therapy: WFL for tasks assessed/performed   PT - Cognitive impairments: No apparent impairments                         Following commands: Intact       Cueing Cueing Techniques: Verbal cues     General Comments      Exercises     Assessment/Plan    PT Assessment Patient needs continued PT services  PT Problem List Decreased strength;Decreased knowledge of use of DME;Decreased mobility;Decreased balance;Decreased activity tolerance;Decreased range of motion;Decreased safety awareness;Decreased knowledge of precautions;Cardiopulmonary status limiting activity       PT Treatment Interventions DME instruction;Gait training;Stair training;Functional mobility training;Therapeutic activities;Therapeutic exercise;Balance training;Patient/family education;Wheelchair mobility training    PT Goals (Current goals can be found in the Care Plan section)  Acute Rehab PT Goals Patient Stated Goal: get home today PT Goal Formulation: With patient Time For Goal Achievement: 11/12/23 Potential to Achieve Goals: Good    Frequency Min 2X/week     Co-evaluation               AM-PAC PT "6 Clicks" Mobility  Outcome Measure Help needed turning from your back to your side while in a flat bed without using bedrails?: None Help needed moving from lying on your back to sitting on the side of a flat bed without using bedrails?: None Help needed moving to and from a bed to a chair  (including a wheelchair)?: A Little Help needed standing up from a chair using your arms (e.g., wheelchair or bedside chair)?: None Help needed to walk in hospital room?: A Little Help needed climbing 3-5 steps with a railing? : A Little 6 Click Score: 21    End of Session Equipment Utilized During Treatment: Gait belt Activity Tolerance: Patient tolerated treatment well Patient left: in bed;with call bell/phone within reach;with family/visitor present Nurse Communication: Mobility status PT Visit Diagnosis: Muscle weakness (generalized) (M62.81);Difficulty in walking, not elsewhere classified (R26.2);Other symptoms and signs involving the nervous system (R29.898);Other abnormalities of gait and mobility (R26.89)    Time: 1610-9604 PT Time Calculation (min) (ACUTE ONLY): 19 min   Charges:   PT Evaluation $PT Eval Moderate Complexity: 1 Mod   PT General Charges $$ ACUTE PT VISIT: 1 Visit         Tish Forge, DPT Acute Rehabilitation Services Office [phone]  10/29/23 1:57 PM

## 2023-10-29 NOTE — Progress Notes (Signed)
   Durable Medical Equipment (From admission, onward)        Start     Ordered  10/29/23 1131  For home use only DME oxygen  Once      Comments: Diagnosis- COPD Question Answer Comment Length of Need Lifetime  Mode or (Route) Nasal cannula  Liters per Minute 2  Frequency Continuous (stationary and portable oxygen unit needed)  Oxygen conserving device Yes  Oxygen delivery system Gas    10/29/23 1131         Riley Cheadle  Mobility Specialist   Progress Notes    Signed   Date of Service: 10/29/2023 11:27 AM   Signed      Nurse requested Mobility Specialist to perform oxygen saturation test with pt which includes removing pt from oxygen both at rest and while ambulating.  Below are the results from that testing.      Patient Saturations on Room Air at Rest = spO2 87%   Patient Saturations on Room Air while Ambulating = sp02 86% .     Patient Saturations on 2 Liters of oxygen while Ambulating = sp02 91%> .   At end of testing pt left in room on 2  Liters of oxygen.   Reported results to nurse.      Janit Meline Mobility Specialist Please contact via SecureChat or [employer] [phone]

## 2023-10-29 NOTE — Progress Notes (Signed)
 Mobility Specialist Progress Note;   10/29/23 0906  Mobility  Activity Ambulated with assistance in hallway  Level of Assistance Contact guard assist, steadying assist  Assistive Device Front wheel walker  Distance Ambulated (ft) 160 ft  Activity Response Tolerated well  Mobility Referral Yes  Mobility visit 1 Mobility  Mobility Specialist Start Time (ACUTE ONLY) 0906  Mobility Specialist Stop Time (ACUTE ONLY) 0921  Mobility Specialist Time Calculation (min) (ACUTE ONLY) 15 min   Pt agreeable to mobility and 6 min walk test. On 2LO2 upon arrival. Required MinG assistance during ambulation for safety. VC required for RW management. Attempted ambulation on RA, however pt required 2LO2 to maintain 91%>. Pt able to ambulate 162ft in 6 minutes. Pt returned back to bed with all needs met, alarm on.   Janit Meline Mobility Specialist Please contact via SecureChat or [employer] [phone]

## 2023-10-29 NOTE — Progress Notes (Signed)
 Rounding Note   Patient Name: Kathryn Cuevas Date of Encounter: 10/29/2023  [facility] Cardiologist: Randene Bustard, MD   Subjective She feels very well this am. No chest pain or dyspnea. Still on oxygen  Scheduled Meds:  aspirin EC  81 mg Oral Daily   clopidogrel  75 mg Oral Q breakfast   heparin  5,000 Units Subcutaneous Q8H   levothyroxine  88 mcg Oral QAC breakfast   metoprolol succinate  25 mg Oral Daily   rosuvastatin  20 mg Oral Daily   sodium chloride flush  3 mL Intravenous Q12H   Continuous Infusions:   PRN Meds: acetaminophen **OR** acetaminophen, nitroGLYCERIN, ondansetron  **OR** ondansetron  (ZOFRAN ) IV, sodium chloride flush   Vital Signs  Vitals:   10/28/23 2000 10/28/23 2253 10/29/23 0328 10/29/23 0758  BP: (!) 109/45 (!) 122/55 (!) 110/58 132/65  Pulse: 65 71 61 74  Resp: 18 15 13 17   Temp: 97.7 F (36.5 C) 97.9 F (36.6 C) 97.6 F (36.4 C) 97.7 F (36.5 C)  TempSrc: Oral Oral Oral Oral  SpO2: 92% 94% 92% 91%  Weight:        Intake/Output Summary (Last 24 hours) at 10/29/2023 0806 Last data filed at 10/29/2023 0400 Gross per 24 hour  Intake 720 ml  Output 1400 ml  Net -680 ml      10/27/2023    4:58 AM 08/14/2022    5:38 PM 09/22/2017    1:53 PM  Last 3 Weights  Weight (lbs) 106 lb 140 lb 141 lb  Weight (kg) 48.081 kg 63.504 kg 63.957 kg      Telemetry NSR - Personally Reviewed  ECG  NSR with anterolateral ST-T abnormality - Personally Reviewed  Physical Exam  GEN: No acute distress. elderly   Neck: No JVD Cardiac: RRR, no murmurs, rubs, or gallops.  Respiratory: Clear to auscultation bilaterally. GI: Soft, nontender, non-distended  MS: No edema; No deformity. Bruising at both radial sites. No hematoma. Right groin looks good. No hematoma Neuro:  Nonfocal  Psych: Normal affect   Labs High Sensitivity Troponin:   Recent Labs  Lab 10/27/23 0523 10/27/23 0720 10/27/23 1655 10/27/23 1917  TROPONINIHS 160* 201* 238* 843*      Chemistry Recent Labs  Lab 10/27/23 0523 10/27/23 1655 10/28/23 0243  NA 136  --  136  K 3.5  --  3.9  CL 105  --  104  CO2 22  --  23  GLUCOSE 109*  --  100*  BUN 17  --  10  CREATININE 0.59 0.73 0.62  CALCIUM 8.7*  --  8.5*  MG  --  1.9  --   PROT  --   --  5.5*  ALBUMIN  --   --  2.9*  AST  --   --  26  ALT  --   --  13  ALKPHOS  --   --  75  BILITOT  --   --  0.8  GFRNONAA >60 >60 >60  ANIONGAP 9  --  9    Lipids No results for input(s): "CHOL", "TRIG", "HDL", "LABVLDL", "LDLCALC", "CHOLHDL" in the last 168 hours.  Hematology Recent Labs  Lab 10/27/23 0523 10/27/23 1653 10/28/23 0243  WBC 8.6 9.2 9.6  RBC 4.73 4.50 4.29  HGB 16.1* 14.8 14.3  HCT 49.4* 44.8 42.6  MCV 104.4* 99.6 99.3  MCH 34.0 32.9 33.3  MCHC 32.6 33.0 33.6  RDW 12.8 13.0 13.0  PLT 218 227 213  Thyroid  No results for input(s): "TSH", "FREET4" in the last 168 hours.  BNPNo results for input(s): "BNP", "PROBNP" in the last 168 hours.  DDimer No results for input(s): "DDIMER" in the last 168 hours.   Radiology  ECHOCARDIOGRAM COMPLETE Result Date: 10/28/2023    ECHOCARDIOGRAM REPORT   Patient Name:   Kathryn Cuevas Date of Exam: 10/28/2023 Medical Rec #:  [id number]   Height:       64.0 in Accession #:    [id number]  Weight:       106.0 lb Date of Birth:  [birth year]   BSA:          1.494 m Patient Age:    89 years    BP:           125/58 mmHg Patient Gender: F           HR:           66 bpm. Exam Location:  Inpatient Procedure: 2D Echo (Both Spectral and Color Flow Doppler were utilized during            procedure). Indications:    NSTEMI  History:        Patient has prior history of Echocardiogram examinations, most                 recent 10/21/2017. COPD, Arrythmias:Tachycardia; Risk                 Factors:Current Smoker.  Sonographer:    Dione Franks RDCS Referring Phys: [id number] Debria Fang  Sonographer Comments: Image acquisition challenging due to respiratory motion. IMPRESSIONS  1.  Left ventricular ejection fraction, by estimation, is 45 to 50%. The left ventricle has mildly decreased function. The left ventricle demonstrates regional wall motion abnormalities (see scoring diagram/findings for description). There is mild asymmetric left ventricular hypertrophy of the basal-septal segment. Left ventricular diastolic parameters are consistent with Grade I diastolic dysfunction (impaired relaxation).  2. Right ventricular systolic function is normal. The right ventricular size is normal.  3. The mitral valve is degenerative. No evidence of mitral valve regurgitation. No evidence of mitral stenosis. Moderate mitral annular calcification.  4. The aortic valve is tricuspid. There is mild calcification of the aortic valve. Aortic valve regurgitation is mild. Aortic valve sclerosis/calcification is present, without any evidence of aortic stenosis.  5. The inferior vena cava is normal in size with <50% respiratory variability, suggesting right atrial pressure of 8 mmHg. FINDINGS  Left Ventricle: Left ventricular ejection fraction, by estimation, is 45 to 50%. The left ventricle has mildly decreased function. The left ventricle demonstrates regional wall motion abnormalities. The left ventricular internal cavity size was normal in size. There is mild asymmetric left ventricular hypertrophy of the basal-septal segment. Left ventricular diastolic parameters are consistent with Grade I diastolic dysfunction (impaired relaxation).  LV Wall Scoring: The apical septal segment is akinetic. The mid anteroseptal segment and apex are hypokinetic. Right Ventricle: The right ventricular size is normal. No increase in right ventricular wall thickness. Right ventricular systolic function is normal. Left Atrium: Left atrial size was normal in size. Right Atrium: Right atrial size was normal in size. Pericardium: There is no evidence of pericardial effusion. Mitral Valve: The mitral valve is degenerative in appearance.  Moderate mitral annular calcification. No evidence of mitral valve regurgitation. No evidence of mitral valve stenosis. Tricuspid Valve: The tricuspid valve is normal in structure. Tricuspid valve regurgitation is mild . No evidence of tricuspid stenosis. Aortic Valve: The aortic  valve is tricuspid. There is mild calcification of the aortic valve. Aortic valve regurgitation is mild. Aortic valve sclerosis/calcification is present, without any evidence of aortic stenosis. Pulmonic Valve: The pulmonic valve was normal in structure. Pulmonic valve regurgitation is trivial. No evidence of pulmonic stenosis. Aorta: The aortic root is normal in size and structure. Venous: The inferior vena cava is normal in size with less than 50% respiratory variability, suggesting right atrial pressure of 8 mmHg. IAS/Shunts: No atrial level shunt detected by color flow Doppler.  LEFT VENTRICLE PLAX 2D LVIDd:         4.00 cm     Diastology LVIDs:         2.20 cm     LV e' medial:    7.62 cm/s LV PW:         0.80 cm     LV E/e' medial:  7.6 LV IVS:        0.60 cm     LV e' lateral:   7.51 cm/s LVOT diam:     1.60 cm     LV E/e' lateral: 7.7 LV SV:         46 LV SV Index:   31 LVOT Area:     2.01 cm  LV Volumes (MOD) LV vol d, MOD A4C: 57.1 ml LV vol s, MOD A4C: 30.4 ml LV SV MOD A4C:     57.1 ml RIGHT VENTRICLE             IVC RV Basal diam:  2.30 cm     IVC diam: 1.90 cm RV S prime:     11.40 cm/s TAPSE (M-mode): 1.5 cm LEFT ATRIUM             Index        RIGHT ATRIUM           Index LA diam:        2.70 cm 1.81 cm/m   RA Area:     10.40 cm LA Vol (A2C):   32.3 ml 21.62 ml/m  RA Volume:   22.20 ml  14.86 ml/m LA Vol (A4C):   22.1 ml 14.80 ml/m LA Biplane Vol: 27.1 ml 18.14 ml/m  AORTIC VALVE LVOT Vmax:   114.00 cm/s LVOT Vmean:  75.200 cm/s LVOT VTI:    0.228 m  AORTA Ao Root diam: 3.10 cm Ao Asc diam:  2.90 cm MV E velocity: 57.60 cm/s MV A velocity: 108.00 cm/s  SHUNTS MV E/A ratio:  0.53         Systemic VTI:  0.23 m                              Systemic Diam: 1.60 cm Jules Oar MD Electronically signed by Jules Oar MD Signature Date/Time: 10/28/2023/9:19:05 AM    Final    CARDIAC CATHETERIZATION Result Date: 10/27/2023 Images from the original result were not included. Coronary angiography & intervention 10/27/2023: LM: Normal LAD: Moderately calcific prox-mid LAD 80-95% stenoses          Diag 2 ostial 70% stenosis Lcx: Mid 40% disease RCA: Proximal 80% stenosis LVEDP 4 mmHg Successful percutaneous coronary intervention prox-mid LAD        PTCA and overlapping stents placement 2.5 X 20 mm & 2.75 X 12 mm Synergy drug-eluting stents        Post dilatation with 2.75 & 3.0 mm [place] balloons up to 18 atm Unusually long  and challenging procedure due need to use multiple access sites, severe tortuosity and calcification in proximal/mid LAD necessitating the use of wires, balloons, use of guide liner, stents due to difficulty delivering. Contrast used: 260 cc Residual prox RCA stenosis. Will await recovery from this procedure and follow up renal function assessment tomorrow before decision on furthers management. Cody Das, MD   Cardiac Studies LEFT HEART CATH AND CORONARY ANGIOGRAPHY  CORONARY STENT INTERVENTION   Conclusion  Coronary angiography & intervention 10/27/2023: LM: Normal LAD: Moderately calcific prox-mid LAD 80-95% stenoses          Diag 2 ostial 70% stenosis Lcx: Mid 40% disease RCA: Proximal 80% stenosis   LVEDP 4 mmHg      Successful percutaneous coronary intervention prox-mid LAD        PTCA and overlapping stents placement 2.5 X 20 mm & 2.75 X 12 mm Synergy drug-eluting stents        Post dilatation with 2.75 & 3.0 mm [place] balloons up to 18 atm     Unusually long and challenging procedure due need to use multiple access sites, severe tortuosity and calcification in proximal/mid LAD necessitating the use of wires, balloons, use of guide liner, stents due to difficulty delivering.    Contrast used: 260 cc Residual prox RCA stenosis. Will await recovery from this procedure and follow up renal function assessment tomorrow before decision on furthers management.     Manish Corliss Dies, MD  IMPRESSIONS     1. Left ventricular ejection fraction, by estimation, is 45 to 50%. The  left ventricle has mildly decreased function. The left ventricle  demonstrates regional wall motion abnormalities (see scoring  diagram/findings for description). There is mild  asymmetric left ventricular hypertrophy of the basal-septal segment. Left  ventricular diastolic parameters are consistent with Grade I diastolic  dysfunction (impaired relaxation).   2. Right ventricular systolic function is normal. The right ventricular  size is normal.   3. The mitral valve is degenerative. No evidence of mitral valve  regurgitation. No evidence of mitral stenosis. Moderate mitral annular  calcification.   4. The aortic valve is tricuspid. There is mild calcification of the  aortic valve. Aortic valve regurgitation is mild. Aortic valve  sclerosis/calcification is present, without any evidence of aortic  stenosis.   5. The inferior vena cava is normal in size with <50% respiratory  variability, suggesting right atrial pressure of 8 mmHg.   Patient Profile   89 y.o. female with HLD admitted with NSTEMI  Assessment & Plan  NSTEMI. Peak troponin 843. Anterolateral ST-T changes on Ecg. EF 45-50%. S/p cath yesterday with PCI of the proximal to mid LAD with DES x 2. Prolonged difficult procedure due to calcification, difficult access. Did end up with excellent result. She does have significant stenosis in the proximal RCA. This is also calcified. Discussed staged PCI of this vessel. Patient desires to be DC today and return later electively for PCI. I think this is reasonable to allow her some recovery. Currently on DAPT with ASA and Plavix. On high dose statin. On low dose Toprol XL. Should have sl Ntg  to use PRN. Stable for DC today from our standpoint. She has  close follow up in our office on June 16 and can discuss staged PCI of the RCA at that time. This will likely need plaque modification with either atherectomy or lithotripsy given calcification HLD on statin.  Hypoxemia. EF 45-50% by Echo. EDP low at time of cath so  doubt volume overload. Suspect more related to COPD   For questions or updates, please contact [facility] Please consult [email or web address] for contact info under     Signed, Brazen Domangue Swaziland, MD  10/29/2023, 8:06 AM

## 2023-10-29 NOTE — Progress Notes (Signed)
 CARDIAC REHAB PHASE I   Pt resting in bed, just completed session with OT. Reports feeling well, wanting to go home. Post MI/stent education completed yesterday, all questions and concerns addressed. Referral for CRP2 sent to [facility].   [id number] Ronny Colas, RN BSN 10/29/2023 12:38 PM

## 2023-10-30 ENCOUNTER — Other Ambulatory Visit (HOSPITAL_COMMUNITY): Payer: Self-pay

## 2023-10-30 ENCOUNTER — Telehealth (HOSPITAL_COMMUNITY): Payer: Self-pay

## 2023-10-30 NOTE — Telephone Encounter (Signed)
 Pt's son stated that pt may interested in the cardiac rehab and to call back after she has been cleared to participate.

## 2023-10-30 NOTE — Telephone Encounter (Signed)
 Pt insurance is active and benefits verified through Medicare a/b Co-pay 0, DED $257/$257 met, out of pocket 0/0 met, co-insurance 20%. no pre-authorization required. Passport, 10/30/2023@4 :34, REF# [id number]  How many CR sessions are covered? (ICR)72 Is this a lifetime maximum or an annual maximum? annual Has the member used any of these services to date? no Is there a time limit (weeks/months) on start of program and/or program completion? No  2ndary insurance is active and benefits verified through [employer]. Co-pay 0, DED 0/0 met, out of pocket 0/0 met, co-insurance 0%. No pre-authorization required  Will contact patient to see if she is interested in the Cardiac Rehab Program. If interested, patient will need to complete follow up appt. Once completed, patient will be contacted for scheduling upon review by the RN Navigator.

## 2023-11-03 DIAGNOSIS — I083 Combined rheumatic disorders of mitral, aortic and tricuspid valves: Secondary | ICD-10-CM | POA: Diagnosis not present

## 2023-11-03 DIAGNOSIS — Z9181 History of falling: Secondary | ICD-10-CM | POA: Diagnosis not present

## 2023-11-03 DIAGNOSIS — I249 Acute ischemic heart disease, unspecified: Secondary | ICD-10-CM | POA: Diagnosis not present

## 2023-11-03 DIAGNOSIS — Z7902 Long term (current) use of antithrombotics/antiplatelets: Secondary | ICD-10-CM | POA: Diagnosis not present

## 2023-11-03 DIAGNOSIS — Z96643 Presence of artificial hip joint, bilateral: Secondary | ICD-10-CM | POA: Diagnosis not present

## 2023-11-03 DIAGNOSIS — E785 Hyperlipidemia, unspecified: Secondary | ICD-10-CM | POA: Diagnosis not present

## 2023-11-03 DIAGNOSIS — E039 Hypothyroidism, unspecified: Secondary | ICD-10-CM | POA: Diagnosis not present

## 2023-11-03 DIAGNOSIS — I214 Non-ST elevation (NSTEMI) myocardial infarction: Secondary | ICD-10-CM | POA: Diagnosis not present

## 2023-11-03 DIAGNOSIS — H469 Unspecified optic neuritis: Secondary | ICD-10-CM | POA: Diagnosis not present

## 2023-11-03 DIAGNOSIS — F17210 Nicotine dependence, cigarettes, uncomplicated: Secondary | ICD-10-CM | POA: Diagnosis not present

## 2023-11-03 DIAGNOSIS — J449 Chronic obstructive pulmonary disease, unspecified: Secondary | ICD-10-CM | POA: Diagnosis not present

## 2023-11-03 DIAGNOSIS — M5416 Radiculopathy, lumbar region: Secondary | ICD-10-CM | POA: Diagnosis not present

## 2023-11-03 DIAGNOSIS — Z955 Presence of coronary angioplasty implant and graft: Secondary | ICD-10-CM | POA: Diagnosis not present

## 2023-11-03 DIAGNOSIS — Z7982 Long term (current) use of aspirin: Secondary | ICD-10-CM | POA: Diagnosis not present

## 2023-11-03 DIAGNOSIS — M1990 Unspecified osteoarthritis, unspecified site: Secondary | ICD-10-CM | POA: Diagnosis not present

## 2023-11-06 DIAGNOSIS — Z09 Encounter for follow-up examination after completed treatment for conditions other than malignant neoplasm: Secondary | ICD-10-CM | POA: Diagnosis not present

## 2023-11-06 DIAGNOSIS — E039 Hypothyroidism, unspecified: Secondary | ICD-10-CM | POA: Diagnosis not present

## 2023-11-06 DIAGNOSIS — Z6820 Body mass index (BMI) 20.0-20.9, adult: Secondary | ICD-10-CM | POA: Diagnosis not present

## 2023-11-06 DIAGNOSIS — I213 ST elevation (STEMI) myocardial infarction of unspecified site: Secondary | ICD-10-CM | POA: Diagnosis not present

## 2023-11-09 ENCOUNTER — Ambulatory Visit: Attending: Emergency Medicine | Admitting: Emergency Medicine

## 2023-11-09 ENCOUNTER — Encounter: Payer: Self-pay | Admitting: Emergency Medicine

## 2023-11-09 VITALS — BP 128/58 | HR 56 | Ht 63.0 in | Wt 108.0 lb

## 2023-11-09 DIAGNOSIS — I519 Heart disease, unspecified: Secondary | ICD-10-CM | POA: Diagnosis not present

## 2023-11-09 DIAGNOSIS — I2510 Atherosclerotic heart disease of native coronary artery without angina pectoris: Secondary | ICD-10-CM | POA: Insufficient documentation

## 2023-11-09 DIAGNOSIS — I214 Non-ST elevation (NSTEMI) myocardial infarction: Secondary | ICD-10-CM | POA: Insufficient documentation

## 2023-11-09 DIAGNOSIS — I249 Acute ischemic heart disease, unspecified: Secondary | ICD-10-CM | POA: Diagnosis not present

## 2023-11-09 DIAGNOSIS — E782 Mixed hyperlipidemia: Secondary | ICD-10-CM | POA: Diagnosis not present

## 2023-11-09 MED ORDER — ATORVASTATIN CALCIUM 40 MG PO TABS: 40.0000 mg | ORAL_TABLET | Freq: Every day | ORAL | 3 refills | Status: DC

## 2023-11-09 NOTE — Progress Notes (Signed)
 Cardiology Office Note:    Date:  11/09/2023  ID:  Kathryn Cuevas, DOB 09/19/1931, MRN [id number] PCP: Kathryn Mould, MD  [facility] Providers Cardiologist:  Randene Bustard, MD       Patient Profile:       Chief Complaint: Follow-up after cardiac catheterization History of Present Illness:  Kathryn Cuevas is a 89 y.o. female with visit-pertinent history of hyperlipidemia, hypothyroidism  Patient was admitted to the hospital 10/27/2023 for evaluation of chest pain.  Patient was not routinely following with cardiology as she had no past cardiac history.  Was seen once by Dr. Addie Holstein in 2019, at that time there was concern the patient possibly had a TIA in 10/2016.  She had carotid ultrasounds in 11/2016 that showed no significant arthrosclerotic plaque bilaterally.  She had an echocardiogram in 09/2017 that showed LVEF of 60 to 65%, no RWMA, no significant valvular abnormalities.  No evidence of PFO or ASD.  Nothing to suggest source of possible TIA, she has not been seen by cardiology since that time.  She had presented to the ED on 10/27/2023 with reports of chest pain.  Troponins 160, 201.  Peak troponin 843.  EKG showed sinus rhythm with PACs, T wave inversions in V2-V6.  General cardiac catheterization with PCI of the proximal to mid LAD with DES x 2.  Was a prolonged difficult procedure due to calcification and difficult access.  She did have significant stenosis in the proximal RCA that was also calcified.  There was discussion of staged PCI.  Patient desires to be discharged however and return later for elective PCI.  She was started on DAPT with aspirin  and Plavix .  She was started on low-dose Toprol -XL.  She was to follow-up in office to discuss staged PCI of the RCA.  Echocardiogram 10/28/2023 with LVEF 45 to 50%, mild LVH, grade 1 DD, mild aortic valve regurgitation.   Discussed the use of AI scribe software for clinical note transcription with the patient, who gave verbal consent to  proceed.  History of Present Illness Kathryn Cuevas is an 89 year old female with coronary artery disease who presents for follow-up after recent cardiac catheterization and stent placement.  Today she tells me she has significant improvement with no current chest pressure or pain and can walk without discomfort.  She denies any dyspnea, orthopnea, PND.  She still some mild fatigue however this has been improving.  She is taking rosuvastatin  at night, which she believes is causing severe leg aches, affecting her ability to walk comfortably during the day.  She has not taken in the last 2 days with improvement in her symptoms.  She lives independently, manages her finances, and performs daily activities such as grocery shopping and cooking, though she avoids heavy housework.   Review of systems:  Please see the history of present illness. All other systems are reviewed and otherwise negative.      Studies Reviewed:        Echocardiogram from 10/28/2023 1. Left ventricular ejection fraction, by estimation, is 45 to 50%. The  left ventricle has mildly decreased function. The left ventricle  demonstrates regional wall motion abnormalities (see scoring  diagram/findings for description). There is mild  asymmetric left ventricular hypertrophy of the basal-septal segment. Left  ventricular diastolic parameters are consistent with Grade I diastolic  dysfunction (impaired relaxation).   2. Right ventricular systolic function is normal. The right ventricular  size is normal.   3. The mitral valve  is degenerative. No evidence of mitral valve  regurgitation. No evidence of mitral stenosis. Moderate mitral annular  calcification.   4. The aortic valve is tricuspid. There is mild calcification of the  aortic valve. Aortic valve regurgitation is mild. Aortic valve  sclerosis/calcification is present, without any evidence of aortic  stenosis.   5. The inferior vena cava is normal in size with <50%  respiratory  variability, suggesting right atrial pressure of 8 mmHg.   Cardiac catheterization 10/27/2023 Successful percutaneous coronary intervention prox-mid LAD        PTCA and overlapping stents placement 2.5 X 20 mm & 2.75 X 12 mm Synergy drug-eluting stents        Post dilatation with 2.75 & 3.0 mm [place] balloons up to 18 atm     Unusually long and challenging procedure due need to use multiple access sites, severe tortuosity and calcification in proximal/mid LAD necessitating the use of wires, balloons, use of guide liner, stents due to difficulty delivering.   Contrast used: 260 cc Residual prox RCA stenosis. Will await recovery from this procedure and follow up renal function assessment tomorrow before decision on furthers management. Diagnostic Dominance: Right  Intervention   Risk Assessment/Calculations:              Physical Exam:   VS:  BP (!) 128/58 (BP Location: Left Arm, Patient Position: Sitting, Cuff Size: Normal)   Pulse (!) 56   Ht 5' 3 (1.6 m)   Wt 108 lb (49 kg)   BMI 19.13 kg/m    Wt Readings from Last 3 Encounters:  11/09/23 108 lb (49 kg)  10/27/23 106 lb (48.1 kg)  08/14/22 140 lb (63.5 kg)    GEN: Well nourished, well developed in no acute distress NECK: No JVD; No carotid bruits CARDIAC: RRR, no murmurs, rubs, gallops RESPIRATORY:  Clear to auscultation without rales, wheezing or rhonchi  ABDOMEN: Soft, non-tender, non-distended EXTREMITIES:  No edema; No acute deformity      Assessment and Plan:  Coronary artery disease / NSTEMI Cardiac catheterization 10/27/2023 with PCI/DES x 2 with overlapping stents to proximal-mid LAD.  This was a prolonged difficult procedure due to calcification and difficult access.  She did have significant stenosis in proximal RCA at 80%.  Was discharged home to follow-up outpatient for staged PCI to RCA - Today she is doing well with complete resolution of chest pains.  Denies any exertional angina.  Patient prefers  to go through with staged PCI of RCA. - Plan for staged cardiac catheterization with PCI of RCA.  Scheduled with Dr. Swaziland on 11/19/2023 - BMET and CBC today - Left radial cath site healing appropriately without bruit, swelling, hematoma  Hyperlipidemia, LDL goal <55 LDL 102 on 10/2023 Lipoprotein (A) 35.6 on 10/2023 Was started on rosuvastatin  during admission and experienced myalgias and self discontinued.  Will trial her on atorvastatin 40 mg daily. - Start atorvastatin 40mg  daily - Repeat lipid panel/LFT x 12 weeks  LV dysfunction Echocardiogram 10/2023 with LVEF 45 to 50% - Today patient is euvolemic and well compensated on exam - Recommend repeat echocardiogram x 3 months to reevaluate LV function    Cardiac Rehabilitation Eligibility Assessment  The patient is NOT ready to start cardiac rehabilitation due to: Other (Plan for staged PCI)  Informed Consent   Shared Decision Making/Informed Consent The risks [stroke (1 in 1000), death (1 in 1000), kidney failure [usually temporary] (1 in 500), bleeding (1 in 200), allergic reaction [possibly serious] (1 in  200)], benefits (diagnostic support and management of coronary artery disease) and alternatives of a cardiac catheterization were discussed in detail with Ms. Riggin and she is willing to proceed.     Dispo:  Return in about 1 month (around 12/09/2023).  Signed, Ava Boatman, NP

## 2023-11-09 NOTE — Patient Instructions (Signed)
 Medication Instructions:  STOP TAKING ROSUVASTATIN . START TAKING ATORVASTATIN 40 MG DAILY.   Lab Work: Nutritional therapist AND CBC TO BE DONE TODAY.   Testing/Procedures:  [facility] A DEPT OF [place] [address] A DEPT OF THE [place]. [facility] [address] [place] [address] Dept: [phone] Loc: [phone]  WANETTA FUNDERBURKE  11/09/2023  You are scheduled for a Cardiac Catheterization on Thursday, June 26 with Dr. Peter Swaziland.  1. Please arrive at the [facility] (Main Entrance A) at [facility]: [address] [place], [place] [address] at 7:00 AM (This time is 2 hour(s) before your procedure to ensure your preparation).   Free valet parking service is available. You will check in at ADMITTING. The support person will be asked to wait in the waiting room.  It is OK to have someone drop you off and come back when you are ready to be discharged.    Special note: Every effort is made to have your procedure done on time. Please understand that emergencies sometimes delay scheduled procedures.  2. Diet: Do not eat solid foods after midnight.  The patient may have clear liquids until 5am upon the day of the procedure.  3. Labs: You will need to have blood drawn on Monday, June 16 at [facility] (1st Floor), [address], [place], [place] [address]. You do not need to be fasting.  4. Medication instructions in preparation for your procedure:   Contrast Allergy: No  On the morning of your procedure, take your Aspirin  81 mg and any morning medicines NOT listed above.  You may use sips of water.  5. Plan to go home the same day, you will only stay overnight if medically necessary. 6. Bring a current list of your medications and current insurance cards. 7. You MUST have a responsible person to drive you home. 8. Someone MUST be with you the first 24 hours after you arrive home or your  discharge will be delayed. 9. Please wear clothes that are easy to get on and off and wear slip-on shoes.  Thank you for allowing us  to care for you!   -- [facility]   Follow-Up: At [facility], you and your health needs are our priority.  As part of our continuing mission to provide you with exceptional heart care, our providers are all part of one team.  This team includes your primary Cardiologist (physician) and Advanced Practice Providers or APPs (Physician Assistants and Nurse Practitioners) who all work together to provide you with the care you need, when you need it.  Your next appointment:   3-4 WEEKS  Provider:   MADISON FOUNTAIN, DNP

## 2023-11-09 NOTE — H&P (View-Only) (Signed)
 Cardiology Office Note:    Date:  11/09/2023  ID:  Kathryn Cuevas, DOB 09/19/1931, MRN [id number] PCP: Jimmey Mould, MD  [facility] Providers Cardiologist:  Randene Bustard, MD       Patient Profile:       Chief Complaint: Follow-up after cardiac catheterization History of Present Illness:  Kathryn Cuevas is a 89 y.o. female with visit-pertinent history of hyperlipidemia, hypothyroidism  Patient was admitted to the hospital 10/27/2023 for evaluation of chest pain.  Patient was not routinely following with cardiology as she had no past cardiac history.  Was seen once by Dr. Addie Holstein in 2019, at that time there was concern the patient possibly had a TIA in 10/2016.  She had carotid ultrasounds in 11/2016 that showed no significant arthrosclerotic plaque bilaterally.  She had an echocardiogram in 09/2017 that showed LVEF of 60 to 65%, no RWMA, no significant valvular abnormalities.  No evidence of PFO or ASD.  Nothing to suggest source of possible TIA, she has not been seen by cardiology since that time.  She had presented to the ED on 10/27/2023 with reports of chest pain.  Troponins 160, 201.  Peak troponin 843.  EKG showed sinus rhythm with PACs, T wave inversions in V2-V6.  General cardiac catheterization with PCI of the proximal to mid LAD with DES x 2.  Was a prolonged difficult procedure due to calcification and difficult access.  She did have significant stenosis in the proximal RCA that was also calcified.  There was discussion of staged PCI.  Patient desires to be discharged however and return later for elective PCI.  She was started on DAPT with aspirin  and Plavix .  She was started on low-dose Toprol -XL.  She was to follow-up in office to discuss staged PCI of the RCA.  Echocardiogram 10/28/2023 with LVEF 45 to 50%, mild LVH, grade 1 DD, mild aortic valve regurgitation.   Discussed the use of AI scribe software for clinical note transcription with the patient, who gave verbal consent to  proceed.  History of Present Illness Kathryn Cuevas is an 89 year old female with coronary artery disease who presents for follow-up after recent cardiac catheterization and stent placement.  Today she tells me she has significant improvement with no current chest pressure or pain and can walk without discomfort.  She denies any dyspnea, orthopnea, PND.  She still some mild fatigue however this has been improving.  She is taking rosuvastatin  at night, which she believes is causing severe leg aches, affecting her ability to walk comfortably during the day.  She has not taken in the last 2 days with improvement in her symptoms.  She lives independently, manages her finances, and performs daily activities such as grocery shopping and cooking, though she avoids heavy housework.   Review of systems:  Please see the history of present illness. All other systems are reviewed and otherwise negative.      Studies Reviewed:        Echocardiogram from 10/28/2023 1. Left ventricular ejection fraction, by estimation, is 45 to 50%. The  left ventricle has mildly decreased function. The left ventricle  demonstrates regional wall motion abnormalities (see scoring  diagram/findings for description). There is mild  asymmetric left ventricular hypertrophy of the basal-septal segment. Left  ventricular diastolic parameters are consistent with Grade I diastolic  dysfunction (impaired relaxation).   2. Right ventricular systolic function is normal. The right ventricular  size is normal.   3. The mitral valve  is degenerative. No evidence of mitral valve  regurgitation. No evidence of mitral stenosis. Moderate mitral annular  calcification.   4. The aortic valve is tricuspid. There is mild calcification of the  aortic valve. Aortic valve regurgitation is mild. Aortic valve  sclerosis/calcification is present, without any evidence of aortic  stenosis.   5. The inferior vena cava is normal in size with <50%  respiratory  variability, suggesting right atrial pressure of 8 mmHg.   Cardiac catheterization 10/27/2023 Successful percutaneous coronary intervention prox-mid LAD        PTCA and overlapping stents placement 2.5 X 20 mm & 2.75 X 12 mm Synergy drug-eluting stents        Post dilatation with 2.75 & 3.0 mm Elcho balloons up to 18 atm     Unusually long and challenging procedure due need to use multiple access sites, severe tortuosity and calcification in proximal/mid LAD necessitating the use of wires, balloons, use of guide liner, stents due to difficulty delivering.   Contrast used: 260 cc Residual prox RCA stenosis. Will await recovery from this procedure and follow up renal function assessment tomorrow before decision on furthers management. Diagnostic Dominance: Right  Intervention   Risk Assessment/Calculations:              Physical Exam:   VS:  BP (!) 128/58 (BP Location: Left Arm, Patient Position: Sitting, Cuff Size: Normal)   Pulse (!) 56   Ht 5' 3 (1.6 m)   Wt 108 lb (49 kg)   BMI 19.13 kg/m    Wt Readings from Last 3 Encounters:  11/09/23 108 lb (49 kg)  10/27/23 106 lb (48.1 kg)  08/14/22 140 lb (63.5 kg)    GEN: Well nourished, well developed in no acute distress NECK: No JVD; No carotid bruits CARDIAC: RRR, no murmurs, rubs, gallops RESPIRATORY:  Clear to auscultation without rales, wheezing or rhonchi  ABDOMEN: Soft, non-tender, non-distended EXTREMITIES:  No edema; No acute deformity      Assessment and Plan:  Coronary artery disease / NSTEMI Cardiac catheterization 10/27/2023 with PCI/DES x 2 with overlapping stents to proximal-mid LAD.  This was a prolonged difficult procedure due to calcification and difficult access.  She did have significant stenosis in proximal RCA at 80%.  Was discharged home to follow-up outpatient for staged PCI to RCA - Today she is doing well with complete resolution of chest pains.  Denies any exertional angina.  Patient prefers  to go through with staged PCI of RCA. - Plan for staged cardiac catheterization with PCI of RCA.  Scheduled with Dr. [place] on 11/19/2023 - BMET and CBC today - Left radial cath site healing appropriately without bruit, swelling, hematoma  Hyperlipidemia, LDL goal <55 LDL 102 on 10/2023 Lipoprotein (A) 35.6 on 10/2023 Was started on rosuvastatin  during admission and experienced myalgias and self discontinued.  Will trial her on atorvastatin 40 mg daily. - Start atorvastatin 40mg  daily - Repeat lipid panel/LFT x 12 weeks  LV dysfunction Echocardiogram 10/2023 with LVEF 45 to 50% - Today patient is euvolemic and well compensated on exam - Recommend repeat echocardiogram x 3 months to reevaluate LV function    Cardiac Rehabilitation Eligibility Assessment  The patient is NOT ready to start cardiac rehabilitation due to: Other (Plan for staged PCI)  Informed Consent   Shared Decision Making/Informed Consent The risks [stroke (1 in 1000), death (1 in 1000), kidney failure [usually temporary] (1 in 500), bleeding (1 in 200), allergic reaction [possibly serious] (1 in  200)], benefits (diagnostic support and management of coronary artery disease) and alternatives of a cardiac catheterization were discussed in detail with Ms. Riggin and she is willing to proceed.     Dispo:  Return in about 1 month (around 12/09/2023).  Signed, Ava Boatman, NP

## 2023-11-10 DIAGNOSIS — I083 Combined rheumatic disorders of mitral, aortic and tricuspid valves: Secondary | ICD-10-CM | POA: Diagnosis not present

## 2023-11-10 DIAGNOSIS — M1990 Unspecified osteoarthritis, unspecified site: Secondary | ICD-10-CM | POA: Diagnosis not present

## 2023-11-10 DIAGNOSIS — J449 Chronic obstructive pulmonary disease, unspecified: Secondary | ICD-10-CM | POA: Diagnosis not present

## 2023-11-10 DIAGNOSIS — M5416 Radiculopathy, lumbar region: Secondary | ICD-10-CM | POA: Diagnosis not present

## 2023-11-10 DIAGNOSIS — I214 Non-ST elevation (NSTEMI) myocardial infarction: Secondary | ICD-10-CM | POA: Diagnosis not present

## 2023-11-10 DIAGNOSIS — I249 Acute ischemic heart disease, unspecified: Secondary | ICD-10-CM | POA: Diagnosis not present

## 2023-11-10 LAB — CBC
Hematocrit: 42 % (ref 34.0–46.6)
Hemoglobin: 14.1 g/dL (ref 11.1–15.9)
MCH: 34.1 pg — ABNORMAL HIGH (ref 26.6–33.0)
MCHC: 33.6 g/dL (ref 31.5–35.7)
MCV: 102 fL — ABNORMAL HIGH (ref 79–97)
Platelets: 399 10*3/uL (ref 150–450)
RBC: 4.13 x10E6/uL (ref 3.77–5.28)
RDW: 12.3 % (ref 11.7–15.4)
WBC: 9.9 10*3/uL (ref 3.4–10.8)

## 2023-11-10 LAB — BASIC METABOLIC PANEL WITH GFR
BUN/Creatinine Ratio: 21 (ref 12–28)
BUN: 17 mg/dL (ref 8–27)
CO2: 22 mmol/L (ref 20–29)
Calcium: 9 mg/dL (ref 8.7–10.3)
Chloride: 100 mmol/L (ref 96–106)
Creatinine, Ser: 0.82 mg/dL (ref 0.57–1.00)
Glucose: 95 mg/dL (ref 70–99)
Potassium: 4.8 mmol/L (ref 3.5–5.2)
Sodium: 136 mmol/L (ref 134–144)
eGFR: 68 mL/min/{1.73_m2} (ref >59)

## 2023-11-11 ENCOUNTER — Ambulatory Visit: Payer: Self-pay | Admitting: Emergency Medicine

## 2023-11-17 DIAGNOSIS — N8110 Cystocele, unspecified: Secondary | ICD-10-CM | POA: Diagnosis not present

## 2023-11-18 ENCOUNTER — Telehealth: Payer: Self-pay | Admitting: Cardiology

## 2023-11-18 DIAGNOSIS — I083 Combined rheumatic disorders of mitral, aortic and tricuspid valves: Secondary | ICD-10-CM | POA: Diagnosis not present

## 2023-11-18 DIAGNOSIS — I214 Non-ST elevation (NSTEMI) myocardial infarction: Secondary | ICD-10-CM | POA: Diagnosis not present

## 2023-11-18 DIAGNOSIS — M1990 Unspecified osteoarthritis, unspecified site: Secondary | ICD-10-CM | POA: Diagnosis not present

## 2023-11-18 DIAGNOSIS — J449 Chronic obstructive pulmonary disease, unspecified: Secondary | ICD-10-CM | POA: Diagnosis not present

## 2023-11-18 DIAGNOSIS — M5416 Radiculopathy, lumbar region: Secondary | ICD-10-CM | POA: Diagnosis not present

## 2023-11-18 DIAGNOSIS — I249 Acute ischemic heart disease, unspecified: Secondary | ICD-10-CM | POA: Diagnosis not present

## 2023-11-18 NOTE — Telephone Encounter (Signed)
 Son Benjie) wants a call back directly to patient regarding instructions for surgery scheduled for tomorrow.

## 2023-11-18 NOTE — Telephone Encounter (Signed)
 Tried to call patient and the number that is starred in the chart os for Kathryn Cuevas.  Spoke with patient and she states she have instructions and wanted us  to know that she is coming to her appointment tomorrow

## 2023-11-19 ENCOUNTER — Encounter (HOSPITAL_COMMUNITY)
Admission: RE | Disposition: A | Discharge status: Skilled Nursing Facility | Payer: Self-pay | Source: Home / Self Care | Attending: Cardiology

## 2023-11-19 ENCOUNTER — Ambulatory Visit (HOSPITAL_COMMUNITY)

## 2023-11-19 ENCOUNTER — Inpatient Hospital Stay (HOSPITAL_COMMUNITY)
Admission: RE | Admit: 2023-11-19 | Discharge: 2023-12-01 | DRG: 321 | Disposition: A | Discharge status: Skilled Nursing Facility | Attending: Cardiology | Admitting: Cardiology

## 2023-11-19 ENCOUNTER — Other Ambulatory Visit: Payer: Self-pay

## 2023-11-19 DIAGNOSIS — I468 Cardiac arrest due to other underlying condition: Secondary | ICD-10-CM | POA: Diagnosis not present

## 2023-11-19 DIAGNOSIS — E7800 Pure hypercholesterolemia, unspecified: Secondary | ICD-10-CM | POA: Diagnosis not present

## 2023-11-19 DIAGNOSIS — I252 Old myocardial infarction: Secondary | ICD-10-CM

## 2023-11-19 DIAGNOSIS — Z7902 Long term (current) use of antithrombotics/antiplatelets: Secondary | ICD-10-CM

## 2023-11-19 DIAGNOSIS — Z881 Allergy status to other antibiotic agents status: Secondary | ICD-10-CM | POA: Diagnosis not present

## 2023-11-19 DIAGNOSIS — I3139 Other pericardial effusion (noninflammatory): Principal | ICD-10-CM

## 2023-11-19 DIAGNOSIS — R531 Weakness: Secondary | ICD-10-CM | POA: Diagnosis not present

## 2023-11-19 DIAGNOSIS — D539 Nutritional anemia, unspecified: Secondary | ICD-10-CM | POA: Diagnosis present

## 2023-11-19 DIAGNOSIS — Z8249 Family history of ischemic heart disease and other diseases of the circulatory system: Secondary | ICD-10-CM | POA: Diagnosis not present

## 2023-11-19 DIAGNOSIS — Z9981 Dependence on supplemental oxygen: Secondary | ICD-10-CM | POA: Diagnosis not present

## 2023-11-19 DIAGNOSIS — I10 Essential (primary) hypertension: Secondary | ICD-10-CM | POA: Diagnosis present

## 2023-11-19 DIAGNOSIS — E871 Hypo-osmolality and hyponatremia: Secondary | ICD-10-CM | POA: Diagnosis present

## 2023-11-19 DIAGNOSIS — Z955 Presence of coronary angioplasty implant and graft: Secondary | ICD-10-CM | POA: Diagnosis not present

## 2023-11-19 DIAGNOSIS — Z888 Allergy status to other drugs, medicaments and biological substances status: Secondary | ICD-10-CM

## 2023-11-19 DIAGNOSIS — I314 Cardiac tamponade: Secondary | ICD-10-CM | POA: Diagnosis present

## 2023-11-19 DIAGNOSIS — I25119 Atherosclerotic heart disease of native coronary artery with unspecified angina pectoris: Secondary | ICD-10-CM

## 2023-11-19 DIAGNOSIS — E8881 Metabolic syndrome: Secondary | ICD-10-CM | POA: Diagnosis not present

## 2023-11-19 DIAGNOSIS — I471 Supraventricular tachycardia, unspecified: Secondary | ICD-10-CM | POA: Diagnosis not present

## 2023-11-19 DIAGNOSIS — R634 Abnormal weight loss: Secondary | ICD-10-CM | POA: Diagnosis not present

## 2023-11-19 DIAGNOSIS — J9601 Acute respiratory failure with hypoxia: Secondary | ICD-10-CM

## 2023-11-19 DIAGNOSIS — Y658 Other specified misadventures during surgical and medical care: Secondary | ICD-10-CM | POA: Diagnosis not present

## 2023-11-19 DIAGNOSIS — Z7901 Long term (current) use of anticoagulants: Secondary | ICD-10-CM

## 2023-11-19 DIAGNOSIS — I462 Cardiac arrest due to underlying cardiac condition: Secondary | ICD-10-CM | POA: Diagnosis not present

## 2023-11-19 DIAGNOSIS — Z7401 Bed confinement status: Secondary | ICD-10-CM | POA: Diagnosis not present

## 2023-11-19 DIAGNOSIS — Z720 Tobacco use: Secondary | ICD-10-CM | POA: Diagnosis not present

## 2023-11-19 DIAGNOSIS — I480 Paroxysmal atrial fibrillation: Secondary | ICD-10-CM | POA: Diagnosis present

## 2023-11-19 DIAGNOSIS — R918 Other nonspecific abnormal finding of lung field: Secondary | ICD-10-CM | POA: Diagnosis not present

## 2023-11-19 DIAGNOSIS — D72829 Elevated white blood cell count, unspecified: Secondary | ICD-10-CM | POA: Diagnosis not present

## 2023-11-19 DIAGNOSIS — E8770 Fluid overload, unspecified: Secondary | ICD-10-CM | POA: Diagnosis not present

## 2023-11-19 DIAGNOSIS — E785 Hyperlipidemia, unspecified: Secondary | ICD-10-CM | POA: Diagnosis not present

## 2023-11-19 DIAGNOSIS — Z96643 Presence of artificial hip joint, bilateral: Secondary | ICD-10-CM | POA: Diagnosis present

## 2023-11-19 DIAGNOSIS — I082 Rheumatic disorders of both aortic and tricuspid valves: Secondary | ICD-10-CM | POA: Diagnosis not present

## 2023-11-19 DIAGNOSIS — H47011 Ischemic optic neuropathy, right eye: Secondary | ICD-10-CM | POA: Diagnosis not present

## 2023-11-19 DIAGNOSIS — R2689 Other abnormalities of gait and mobility: Secondary | ICD-10-CM | POA: Diagnosis not present

## 2023-11-19 DIAGNOSIS — Z79890 Hormone replacement therapy (postmenopausal): Secondary | ICD-10-CM

## 2023-11-19 DIAGNOSIS — Y718 Miscellaneous cardiovascular devices associated with adverse incidents, not elsewhere classified: Secondary | ICD-10-CM | POA: Diagnosis not present

## 2023-11-19 DIAGNOSIS — J309 Allergic rhinitis, unspecified: Secondary | ICD-10-CM | POA: Diagnosis not present

## 2023-11-19 DIAGNOSIS — R0602 Shortness of breath: Secondary | ICD-10-CM | POA: Diagnosis not present

## 2023-11-19 DIAGNOSIS — Z28311 Partially vaccinated for covid-19: Secondary | ICD-10-CM | POA: Diagnosis not present

## 2023-11-19 DIAGNOSIS — Z882 Allergy status to sulfonamides status: Secondary | ICD-10-CM | POA: Diagnosis not present

## 2023-11-19 DIAGNOSIS — I2510 Atherosclerotic heart disease of native coronary artery without angina pectoris: Secondary | ICD-10-CM | POA: Diagnosis not present

## 2023-11-19 DIAGNOSIS — R079 Chest pain, unspecified: Secondary | ICD-10-CM | POA: Diagnosis not present

## 2023-11-19 DIAGNOSIS — R41841 Cognitive communication deficit: Secondary | ICD-10-CM | POA: Diagnosis not present

## 2023-11-19 DIAGNOSIS — Z23 Encounter for immunization: Secondary | ICD-10-CM | POA: Diagnosis not present

## 2023-11-19 DIAGNOSIS — R9431 Abnormal electrocardiogram [ECG] [EKG]: Secondary | ICD-10-CM | POA: Diagnosis not present

## 2023-11-19 DIAGNOSIS — I249 Acute ischemic heart disease, unspecified: Secondary | ICD-10-CM | POA: Diagnosis not present

## 2023-11-19 DIAGNOSIS — I959 Hypotension, unspecified: Secondary | ICD-10-CM | POA: Diagnosis not present

## 2023-11-19 DIAGNOSIS — I25118 Atherosclerotic heart disease of native coronary artery with other forms of angina pectoris: Secondary | ICD-10-CM | POA: Diagnosis not present

## 2023-11-19 DIAGNOSIS — I9751 Accidental puncture and laceration of a circulatory system organ or structure during a circulatory system procedure: Secondary | ICD-10-CM | POA: Diagnosis not present

## 2023-11-19 DIAGNOSIS — I071 Rheumatic tricuspid insufficiency: Secondary | ICD-10-CM | POA: Diagnosis not present

## 2023-11-19 DIAGNOSIS — D531 Other megaloblastic anemias, not elsewhere classified: Secondary | ICD-10-CM | POA: Diagnosis not present

## 2023-11-19 DIAGNOSIS — Z452 Encounter for adjustment and management of vascular access device: Secondary | ICD-10-CM | POA: Diagnosis not present

## 2023-11-19 DIAGNOSIS — J90 Pleural effusion, not elsewhere classified: Secondary | ICD-10-CM | POA: Diagnosis not present

## 2023-11-19 DIAGNOSIS — R7303 Prediabetes: Secondary | ICD-10-CM | POA: Diagnosis not present

## 2023-11-19 DIAGNOSIS — R54 Age-related physical debility: Secondary | ICD-10-CM | POA: Diagnosis present

## 2023-11-19 DIAGNOSIS — R001 Bradycardia, unspecified: Secondary | ICD-10-CM | POA: Diagnosis not present

## 2023-11-19 DIAGNOSIS — J449 Chronic obstructive pulmonary disease, unspecified: Secondary | ICD-10-CM | POA: Diagnosis present

## 2023-11-19 DIAGNOSIS — M6250 Muscle wasting and atrophy, not elsewhere classified, unspecified site: Secondary | ICD-10-CM | POA: Diagnosis not present

## 2023-11-19 DIAGNOSIS — I209 Angina pectoris, unspecified: Secondary | ICD-10-CM | POA: Diagnosis present

## 2023-11-19 DIAGNOSIS — Z48812 Encounter for surgical aftercare following surgery on the circulatory system: Secondary | ICD-10-CM | POA: Diagnosis not present

## 2023-11-19 DIAGNOSIS — R1312 Dysphagia, oropharyngeal phase: Secondary | ICD-10-CM | POA: Diagnosis not present

## 2023-11-19 DIAGNOSIS — Z880 Allergy status to penicillin: Secondary | ICD-10-CM

## 2023-11-19 DIAGNOSIS — D7589 Other specified diseases of blood and blood-forming organs: Secondary | ICD-10-CM | POA: Diagnosis not present

## 2023-11-19 DIAGNOSIS — J918 Pleural effusion in other conditions classified elsewhere: Secondary | ICD-10-CM | POA: Diagnosis not present

## 2023-11-19 DIAGNOSIS — Z79899 Other long term (current) drug therapy: Secondary | ICD-10-CM

## 2023-11-19 DIAGNOSIS — J9611 Chronic respiratory failure with hypoxia: Secondary | ICD-10-CM

## 2023-11-19 DIAGNOSIS — M6281 Muscle weakness (generalized): Secondary | ICD-10-CM | POA: Diagnosis not present

## 2023-11-19 DIAGNOSIS — M160 Bilateral primary osteoarthritis of hip: Secondary | ICD-10-CM | POA: Diagnosis not present

## 2023-11-19 DIAGNOSIS — E039 Hypothyroidism, unspecified: Secondary | ICD-10-CM | POA: Diagnosis not present

## 2023-11-19 DIAGNOSIS — Z751 Person awaiting admission to adequate facility elsewhere: Secondary | ICD-10-CM

## 2023-11-19 DIAGNOSIS — I517 Cardiomegaly: Secondary | ICD-10-CM | POA: Diagnosis not present

## 2023-11-19 DIAGNOSIS — F17210 Nicotine dependence, cigarettes, uncomplicated: Secondary | ICD-10-CM | POA: Diagnosis present

## 2023-11-19 DIAGNOSIS — Z7982 Long term (current) use of aspirin: Secondary | ICD-10-CM

## 2023-11-19 DIAGNOSIS — I214 Non-ST elevation (NSTEMI) myocardial infarction: Secondary | ICD-10-CM | POA: Diagnosis not present

## 2023-11-19 DIAGNOSIS — M5416 Radiculopathy, lumbar region: Secondary | ICD-10-CM | POA: Diagnosis not present

## 2023-11-19 DIAGNOSIS — R0902 Hypoxemia: Secondary | ICD-10-CM | POA: Diagnosis not present

## 2023-11-19 HISTORY — PX: PERICARDIOCENTESIS: CATH118255

## 2023-11-19 HISTORY — PX: CORONARY STENT INTERVENTION: CATH118234

## 2023-11-19 HISTORY — PX: LEFT HEART CATH AND CORONARY ANGIOGRAPHY: CATH118249

## 2023-11-19 HISTORY — PX: CORONARY ATHERECTOMY: CATH118238

## 2023-11-19 LAB — POCT ACTIVATED CLOTTING TIME
Activated Clotting Time: 337 s
Activated Clotting Time: 193 s
Activated Clotting Time: 222 s
Activated Clotting Time: 383 s

## 2023-11-19 LAB — ECHOCARDIOGRAM LIMITED
Height: 63 in
Height: 63 in
Weight: 1760 [oz_av]
Weight: 1760 [oz_av]

## 2023-11-19 SURGERY — LEFT HEART CATH AND CORONARY ANGIOGRAPHY
Anesthesia: LOCAL

## 2023-11-19 MED ORDER — NITROGLYCERIN 1 MG/10 ML FOR IR/CATH LAB
INTRA_ARTERIAL | Status: DC | PRN
  Administered 2023-11-19: 200 ug via INTRACORONARY

## 2023-11-19 MED ORDER — SODIUM CHLORIDE 0.9 % IV SOLN: 250.0000 mL | INTRAVENOUS | Status: AC | PRN

## 2023-11-19 MED ORDER — SODIUM CHLORIDE 0.9 % IV SOLN: 250.0000 mL | INTRAVENOUS | Status: AC

## 2023-11-19 MED ORDER — HEPARIN SODIUM (PORCINE) 1000 UNIT/ML IJ SOLN
INTRAMUSCULAR | Status: AC
  Filled 2023-11-19: qty 10

## 2023-11-19 MED ORDER — ATORVASTATIN CALCIUM 40 MG PO TABS: 40.0000 mg | ORAL_TABLET | Freq: Every day | ORAL | Status: DC

## 2023-11-19 MED ORDER — METOPROLOL SUCCINATE ER 25 MG PO TB24: 25.0000 mg | ORAL_TABLET | Freq: Every day | ORAL | Status: DC

## 2023-11-19 MED ORDER — FENTANYL CITRATE (PF) 100 MCG/2ML IJ SOLN
INTRAMUSCULAR | Status: AC
  Filled 2023-11-19: qty 2

## 2023-11-19 MED ORDER — IOHEXOL 350 MG/ML SOLN
INTRAVENOUS | Status: DC | PRN
Start: 2023-11-19 — End: 2023-11-19
  Administered 2023-11-19: 70 mL

## 2023-11-19 MED ORDER — ASPIRIN 81 MG PO TBEC
81.0000 mg | DELAYED_RELEASE_TABLET | Freq: Every day | ORAL | Status: DC
  Administered 2023-11-20 – 2023-12-01 (×12): 81 mg via ORAL
  Filled 2023-11-19 (×12): qty 1

## 2023-11-19 MED ORDER — NOREPINEPHRINE 4 MG/250ML-% IV SOLN
0.0000 ug/min | INTRAVENOUS | Status: DC
  Administered 2023-11-22: 5 ug/min via INTRAVENOUS
  Administered 2023-11-22: 6 ug/min via INTRAVENOUS
  Administered 2023-11-23: 4 ug/min via INTRAVENOUS
  Filled 2023-11-19 (×3): qty 250

## 2023-11-19 MED ORDER — VIPERSLIDE LUBRICANT OPTIME: TOPICAL | Status: DC | PRN

## 2023-11-19 MED ORDER — NITROGLYCERIN 1 MG/10 ML FOR IR/CATH LAB
INTRA_ARTERIAL | Status: AC
  Filled 2023-11-19: qty 10

## 2023-11-19 MED ORDER — NITROGLYCERIN 0.4 MG SL SUBL: 0.4000 mg | SUBLINGUAL_TABLET | SUBLINGUAL | Status: DC | PRN

## 2023-11-19 MED ORDER — HEPARIN SODIUM (PORCINE) 1000 UNIT/ML IJ SOLN
INTRAMUSCULAR | Status: DC | PRN
  Administered 2023-11-19: 5000 [IU] via INTRAVENOUS
  Administered 2023-11-19 (×2): 3000 [IU] via INTRAVENOUS

## 2023-11-19 MED ORDER — SODIUM CHLORIDE 0.9 % IV SOLN
5.0000 mg/kg | Freq: Once | INTRAVENOUS | Status: AC
  Administered 2023-11-19: 249.5 mg via INTRAVENOUS
  Filled 2023-11-19: qty 9.98

## 2023-11-19 MED ORDER — MIDAZOLAM HCL 2 MG/2ML IJ SOLN
INTRAMUSCULAR | Status: AC
Start: 2023-11-19 — End: 2023-11-19
  Filled 2023-11-19: qty 2

## 2023-11-19 MED ORDER — PHENYLEPHRINE 80 MCG/ML (10ML) SYRINGE FOR IV PUSH (FOR BLOOD PRESSURE SUPPORT): PREFILLED_SYRINGE | INTRAVENOUS | Status: DC | PRN

## 2023-11-19 MED ORDER — ONDANSETRON HCL 4 MG/2ML IJ SOLN
4.0000 mg | Freq: Four times a day (QID) | INTRAMUSCULAR | Status: DC | PRN
  Administered 2023-11-19: 4 mg via INTRAVENOUS

## 2023-11-19 MED ORDER — VERAPAMIL HCL 2.5 MG/ML IV SOLN
INTRAVENOUS | Status: AC
Start: 2023-11-19 — End: 2023-11-19
  Filled 2023-11-19: qty 2

## 2023-11-19 MED ORDER — SODIUM CHLORIDE 0.9 % IV SOLN: INTRAVENOUS | Status: AC

## 2023-11-19 MED ORDER — VASOPRESSIN 20 UNITS/100 ML INFUSION FOR SHOCK
INTRAVENOUS | Status: DC | PRN
  Administered 2023-11-19: .4 [IU]/min via INTRAVENOUS

## 2023-11-19 MED ORDER — SODIUM CHLORIDE 0.9% FLUSH
3.0000 mL | Freq: Two times a day (BID) | INTRAVENOUS | Status: DC
  Administered 2023-11-19 – 2023-11-23 (×8): 3 mL via INTRAVENOUS

## 2023-11-19 MED ORDER — HEPARIN SODIUM (PORCINE) 1000 UNIT/ML IJ SOLN
INTRAMUSCULAR | Status: DC | PRN
  Administered 2023-11-19: 5000 [IU] via INTRAVENOUS

## 2023-11-19 MED ORDER — ASPIRIN 81 MG PO CHEW: 81.0000 mg | CHEWABLE_TABLET | ORAL | Status: DC

## 2023-11-19 MED ORDER — CLOPIDOGREL BISULFATE 75 MG PO TABS
75.0000 mg | ORAL_TABLET | Freq: Every day | ORAL | Status: DC
  Administered 2023-11-20 – 2023-12-01 (×12): 75 mg via ORAL
  Filled 2023-11-19 (×12): qty 1

## 2023-11-19 MED ORDER — ORAL CARE MOUTH RINSE: 15.0000 mL | OROMUCOSAL | Status: DC | PRN

## 2023-11-19 MED ORDER — SODIUM CHLORIDE 0.9% FLUSH
3.0000 mL | Freq: Two times a day (BID) | INTRAVENOUS | Status: DC
Start: 2023-11-19 — End: 2023-11-23
  Administered 2023-11-20 – 2023-11-23 (×7): 3 mL via INTRAVENOUS

## 2023-11-19 MED ORDER — FENTANYL CITRATE (PF) 100 MCG/2ML IJ SOLN
INTRAMUSCULAR | Status: DC | PRN
  Administered 2023-11-19: 25 ug via INTRAVENOUS

## 2023-11-19 MED ORDER — NITROGLYCERIN 1 MG/10 ML FOR IR/CATH LAB
INTRA_ARTERIAL | Status: DC | PRN
Start: 2023-11-19 — End: 2023-11-19
  Administered 2023-11-19: 200 ug via INTRACORONARY

## 2023-11-19 MED ORDER — LEVOTHYROXINE SODIUM 88 MCG PO TABS
88.0000 ug | ORAL_TABLET | Freq: Every day | ORAL | Status: DC
  Administered 2023-11-20 – 2023-12-01 (×12): 88 ug via ORAL
  Filled 2023-11-19 (×12): qty 1

## 2023-11-19 MED ORDER — PHENYLEPHRINE 80 MCG/ML (10ML) SYRINGE FOR IV PUSH (FOR BLOOD PRESSURE SUPPORT)
PREFILLED_SYRINGE | INTRAVENOUS | Status: AC
Start: 2023-11-19 — End: 2023-11-19
  Filled 2023-11-19: qty 10

## 2023-11-19 MED ORDER — HEPARIN SODIUM (PORCINE) 1000 UNIT/ML IJ SOLN
INTRAMUSCULAR | Status: AC
Start: 2023-11-19 — End: 2023-11-19
  Filled 2023-11-19: qty 10

## 2023-11-19 MED ORDER — CHLORHEXIDINE GLUCONATE CLOTH 2 % EX PADS
6.0000 | MEDICATED_PAD | Freq: Every day | CUTANEOUS | Status: DC
  Administered 2023-11-20 – 2023-12-01 (×11): 6 via TOPICAL

## 2023-11-19 MED ORDER — HEPARIN (PORCINE) IN NACL 1000-0.9 UT/500ML-% IV SOLN
INTRAVENOUS | Status: DC | PRN
  Administered 2023-11-19 (×2): 500 mL

## 2023-11-19 MED ORDER — LOSARTAN POTASSIUM 25 MG PO TABS
12.5000 mg | ORAL_TABLET | Freq: Every day | ORAL | Status: DC
Start: 2023-11-20 — End: 2023-11-19

## 2023-11-19 MED ORDER — SODIUM CHLORIDE 0.9 % WEIGHT BASED INFUSION
1.0000 mL/kg/h | INTRAVENOUS | Status: DC
  Administered 2023-11-19: 1 mL/kg/h via INTRAVENOUS

## 2023-11-19 MED ORDER — VASOPRESSIN 20 UNITS/100 ML INFUSION FOR SHOCK
INTRAVENOUS | Status: AC
  Filled 2023-11-19: qty 100

## 2023-11-19 MED ORDER — VERAPAMIL HCL 2.5 MG/ML IV SOLN
INTRAVENOUS | Status: DC | PRN
  Administered 2023-11-19: 10 mL via INTRA_ARTERIAL

## 2023-11-19 MED ORDER — SODIUM CHLORIDE 0.9 % IV SOLN
INTRAVENOUS | Status: AC
Start: 2023-11-19 — End: 2023-11-19

## 2023-11-19 MED ORDER — NOREPINEPHRINE 4 MG/250ML-% IV SOLN
INTRAVENOUS | Status: AC
  Filled 2023-11-19: qty 250

## 2023-11-19 MED ORDER — ONDANSETRON HCL 4 MG/2ML IJ SOLN
INTRAMUSCULAR | Status: AC
  Filled 2023-11-19: qty 2

## 2023-11-19 MED ORDER — VERAPAMIL HCL 2.5 MG/ML IV SOLN
INTRAVENOUS | Status: AC
  Filled 2023-11-19: qty 2

## 2023-11-19 MED ORDER — ATROPINE SULFATE 1 MG/10ML IJ SOSY
PREFILLED_SYRINGE | INTRAMUSCULAR | Status: AC
Start: 2023-11-19 — End: 2023-11-19
  Filled 2023-11-19: qty 10

## 2023-11-19 MED ORDER — VIPERSLIDE LUBRICANT OPTIME
TOPICAL | Status: DC | PRN
  Administered 2023-11-19: 20 mL via SURGICAL_CAVITY

## 2023-11-19 MED ORDER — LIDOCAINE HCL (PF) 1 % IJ SOLN
INTRAMUSCULAR | Status: DC | PRN
  Administered 2023-11-19: 2 mL via INTRADERMAL
  Administered 2023-11-19: 5 mL via INTRADERMAL

## 2023-11-19 MED ORDER — LIDOCAINE HCL (PF) 1 % IJ SOLN
INTRAMUSCULAR | Status: AC
  Filled 2023-11-19: qty 30

## 2023-11-19 MED ORDER — NOREPINEPHRINE 4 MG/250ML-% IV SOLN
INTRAVENOUS | Status: AC
  Administered 2023-11-19: 8 ug/min via INTRAVENOUS
  Filled 2023-11-19: qty 250

## 2023-11-19 MED ORDER — ACETAMINOPHEN 325 MG PO TABS
650.0000 mg | ORAL_TABLET | ORAL | Status: DC | PRN
  Administered 2023-11-19: 650 mg via ORAL
  Filled 2023-11-19: qty 2

## 2023-11-19 MED ORDER — NOREPINEPHRINE BITARTRATE 1 MG/ML IV SOLN
INTRAVENOUS | Status: DC | PRN
  Administered 2023-11-19: 20 ug/kg/min via INTRAVENOUS

## 2023-11-19 MED ORDER — MIDAZOLAM HCL 2 MG/2ML IJ SOLN
INTRAMUSCULAR | Status: DC | PRN
  Administered 2023-11-19: 1 mg via INTRAVENOUS

## 2023-11-19 MED ORDER — EPINEPHRINE 1 MG/10ML IJ SOSY
PREFILLED_SYRINGE | INTRAMUSCULAR | Status: AC
  Filled 2023-11-19: qty 10

## 2023-11-19 MED ORDER — SODIUM CHLORIDE 0.9% FLUSH: 3.0000 mL | INTRAVENOUS | Status: DC | PRN

## 2023-11-19 MED ORDER — IOHEXOL 350 MG/ML SOLN
INTRAVENOUS | Status: DC | PRN
  Administered 2023-11-19: 90 mL

## 2023-11-19 MED ORDER — SODIUM CHLORIDE 0.9 % IV SOLN
250.0000 mL | INTRAVENOUS | Status: AC | PRN
Start: 2023-11-19 — End: 2023-11-20

## 2023-11-19 MED ORDER — SODIUM CHLORIDE 0.9% FLUSH
3.0000 mL | INTRAVENOUS | Status: DC | PRN
Start: 2023-11-19 — End: 2023-11-23

## 2023-11-19 MED ORDER — PHENYLEPHRINE HCL-NACL 20-0.9 MG/250ML-% IV SOLN
INTRAVENOUS | Status: DC | PRN
  Administered 2023-11-19: 240 ug via INTRAVENOUS

## 2023-11-19 MED ORDER — SODIUM CHLORIDE 0.9 % WEIGHT BASED INFUSION: 3.0000 mL/kg/h | INTRAVENOUS | Status: DC

## 2023-11-19 MED ORDER — VASOPRESSIN 20 UNITS/100 ML INFUSION FOR SHOCK
0.0000 [IU]/min | INTRAVENOUS | Status: DC
  Administered 2023-11-19: 0.04 [IU]/min via INTRAVENOUS
  Administered 2023-11-20: 0.03 [IU]/min via INTRAVENOUS
  Administered 2023-11-22 (×2): 0.04 [IU]/min via INTRAVENOUS
  Administered 2023-11-22 – 2023-11-23 (×2): 0.03 [IU]/min via INTRAVENOUS
  Filled 2023-11-19 (×5): qty 100

## 2023-11-19 SURGICAL SUPPLY — 22 items
CATH INFINITI 5FR JR4 125CM (CATHETERS) IMPLANT
CATH INFINITI JR4 5F (CATHETERS) IMPLANT
CATH LAUNCHER 6FR 3DRC (CATHETERS) IMPLANT
DEVICE RAD COMP TR BAND LRG (VASCULAR PRODUCTS) IMPLANT
GLIDESHEATH SLEND SS 6F .021 (SHEATH) IMPLANT
GUIDEWIRE INQWIRE 1.5J.035X260 (WIRE) IMPLANT
KIT ENCORE 26 ADVANTAGE (KITS) IMPLANT
KIT HEART LEFT (KITS) IMPLANT
KIT MICROPUNCTURE NIT STIFF (SHEATH) IMPLANT
PACK CARDIAC CATHETERIZATION (CUSTOM PROCEDURE TRAY) ×1 IMPLANT
SET ATX-X65L (MISCELLANEOUS) IMPLANT
SHEATH PINNACLE 5F 10CM (SHEATH) IMPLANT
SHEATH PROBE COVER 6X72 (BAG) IMPLANT
STENT PK PAPYRUS 4.0X15 (Permanent Stent) IMPLANT
STENT SYNERGY XD 4.0X8 (Permanent Stent) IMPLANT
TRANSDUCER W/STOPCOCK (MISCELLANEOUS) IMPLANT
TRAY PERICARDIOCENTESIS 6FX60 (TRAY / TRAY PROCEDURE) IMPLANT
TUBING ART PRESS 72 MALE/FEM (TUBING) IMPLANT
TUBING CIL FLEX 10 FLL-RA (TUBING) IMPLANT
WIRE ASAHI PROWATER 180CM (WIRE) IMPLANT
WIRE EMERALD 3MM-J .035X150CM (WIRE) IMPLANT
WIRE HI TORQ VERSACORE-J 145CM (WIRE) IMPLANT

## 2023-11-19 NOTE — Procedures (Signed)
 Arterial Catheter Insertion Procedure Note  Kathryn Cuevas  [id number]  [birth year]  Date:11/19/23  Time:8:37 PM    Provider Performing: Sammi Gore    Procedure: Insertion of Arterial Line (63379) with US  guidance (23062)   Indication(s) Blood pressure monitoring and/or need for frequent ABGs  Consent Risks of the procedure as well as the alternatives and risks of each were explained to the patient and/or caregiver.  Consent for the procedure was obtained and is signed in the bedside chart  Anesthesia None   Time Out Verified patient identification, verified procedure, site/side was marked, verified correct patient position, special equipment/implants available, medications/allergies/relevant history reviewed, required imaging and test results available.   Sterile Technique Maximal sterile technique including full sterile barrier drape, hand hygiene, sterile gown, sterile gloves, mask, hair covering, sterile ultrasound probe cover (if used).   Procedure Description Area of catheter insertion was cleaned with chlorhexidine and draped in sterile fashion. With real-time ultrasound guidance an arterial catheter was placed into the right axillary artery.  Appropriate arterial tracings confirmed on monitor.     Complications/Tolerance None; patient tolerated the procedure well.   EBL Minimal   Specimen(s) None   Sammi Gore, PA - [facility] For pager details, please see AMION or use Epic chat  After 1900, please call [facility] for cross coverage needs 11/19/2023, 8:37 PM

## 2023-11-19 NOTE — Progress Notes (Signed)
 Echocardiogram 2D Echocardiogram has been performed.  Kathryn Cuevas 11/19/2023, 5:28 PM

## 2023-11-19 NOTE — Interval H&P Note (Signed)
 History and Physical Interval Note:  11/19/2023 11:57 AM  Kathryn Cuevas  has presented today for surgery, with the diagnosis of cad.  The various methods of treatment have been discussed with the patient and family. After consideration of risks, benefits and other options for treatment, the patient has consented to  Procedure(s): CORONARY ATHERECTOMY (N/A) as a surgical intervention.  The patient's history has been reviewed, patient examined, no change in status, stable for surgery.  I have reviewed the patient's chart and labs.  Questions were answered to the patient's satisfaction.   Cath Lab Visit (complete for each Cath Lab visit)  Clinical Evaluation Leading to the Procedure:   ACS: No.  Non-ACS:    Anginal Classification: CCS II  Anti-ischemic medical therapy: Minimal Therapy (1 class of medications)  Non-Invasive Test Results: No non-invasive testing performed  Prior CABG: No previous CABG        [facility] 11/19/2023 11:57 AM

## 2023-11-20 ENCOUNTER — Encounter (HOSPITAL_COMMUNITY): Payer: Self-pay | Admitting: Cardiology

## 2023-11-20 ENCOUNTER — Ambulatory Visit (HOSPITAL_COMMUNITY)

## 2023-11-20 DIAGNOSIS — I517 Cardiomegaly: Secondary | ICD-10-CM | POA: Diagnosis not present

## 2023-11-20 DIAGNOSIS — Z881 Allergy status to other antibiotic agents status: Secondary | ICD-10-CM | POA: Diagnosis not present

## 2023-11-20 DIAGNOSIS — I462 Cardiac arrest due to underlying cardiac condition: Secondary | ICD-10-CM | POA: Diagnosis not present

## 2023-11-20 DIAGNOSIS — J9601 Acute respiratory failure with hypoxia: Secondary | ICD-10-CM | POA: Diagnosis not present

## 2023-11-20 DIAGNOSIS — Z8249 Family history of ischemic heart disease and other diseases of the circulatory system: Secondary | ICD-10-CM | POA: Diagnosis not present

## 2023-11-20 DIAGNOSIS — E8770 Fluid overload, unspecified: Secondary | ICD-10-CM | POA: Diagnosis not present

## 2023-11-20 DIAGNOSIS — J449 Chronic obstructive pulmonary disease, unspecified: Secondary | ICD-10-CM | POA: Diagnosis present

## 2023-11-20 DIAGNOSIS — F17210 Nicotine dependence, cigarettes, uncomplicated: Secondary | ICD-10-CM | POA: Diagnosis present

## 2023-11-20 DIAGNOSIS — I959 Hypotension, unspecified: Secondary | ICD-10-CM | POA: Diagnosis not present

## 2023-11-20 DIAGNOSIS — I2510 Atherosclerotic heart disease of native coronary artery without angina pectoris: Secondary | ICD-10-CM | POA: Diagnosis present

## 2023-11-20 DIAGNOSIS — E785 Hyperlipidemia, unspecified: Secondary | ICD-10-CM | POA: Diagnosis present

## 2023-11-20 DIAGNOSIS — Z882 Allergy status to sulfonamides status: Secondary | ICD-10-CM | POA: Diagnosis not present

## 2023-11-20 DIAGNOSIS — I10 Essential (primary) hypertension: Secondary | ICD-10-CM | POA: Diagnosis present

## 2023-11-20 DIAGNOSIS — I314 Cardiac tamponade: Secondary | ICD-10-CM | POA: Diagnosis not present

## 2023-11-20 DIAGNOSIS — Y718 Miscellaneous cardiovascular devices associated with adverse incidents, not elsewhere classified: Secondary | ICD-10-CM | POA: Diagnosis not present

## 2023-11-20 DIAGNOSIS — I471 Supraventricular tachycardia, unspecified: Secondary | ICD-10-CM | POA: Diagnosis not present

## 2023-11-20 DIAGNOSIS — I082 Rheumatic disorders of both aortic and tricuspid valves: Secondary | ICD-10-CM

## 2023-11-20 DIAGNOSIS — E039 Hypothyroidism, unspecified: Secondary | ICD-10-CM | POA: Diagnosis present

## 2023-11-20 DIAGNOSIS — I209 Angina pectoris, unspecified: Secondary | ICD-10-CM | POA: Diagnosis present

## 2023-11-20 DIAGNOSIS — R9431 Abnormal electrocardiogram [ECG] [EKG]: Secondary | ICD-10-CM | POA: Diagnosis not present

## 2023-11-20 DIAGNOSIS — Z7901 Long term (current) use of anticoagulants: Secondary | ICD-10-CM | POA: Diagnosis not present

## 2023-11-20 DIAGNOSIS — I480 Paroxysmal atrial fibrillation: Secondary | ICD-10-CM | POA: Diagnosis present

## 2023-11-20 DIAGNOSIS — I3139 Other pericardial effusion (noninflammatory): Secondary | ICD-10-CM

## 2023-11-20 DIAGNOSIS — Z79899 Other long term (current) drug therapy: Secondary | ICD-10-CM | POA: Diagnosis not present

## 2023-11-20 DIAGNOSIS — Y658 Other specified misadventures during surgical and medical care: Secondary | ICD-10-CM | POA: Diagnosis not present

## 2023-11-20 DIAGNOSIS — I9751 Accidental puncture and laceration of a circulatory system organ or structure during a circulatory system procedure: Secondary | ICD-10-CM | POA: Diagnosis not present

## 2023-11-20 DIAGNOSIS — Z955 Presence of coronary angioplasty implant and graft: Secondary | ICD-10-CM | POA: Diagnosis not present

## 2023-11-20 DIAGNOSIS — Z79890 Hormone replacement therapy (postmenopausal): Secondary | ICD-10-CM | POA: Diagnosis not present

## 2023-11-20 DIAGNOSIS — E871 Hypo-osmolality and hyponatremia: Secondary | ICD-10-CM | POA: Diagnosis present

## 2023-11-20 DIAGNOSIS — Z96643 Presence of artificial hip joint, bilateral: Secondary | ICD-10-CM | POA: Diagnosis present

## 2023-11-20 DIAGNOSIS — D539 Nutritional anemia, unspecified: Secondary | ICD-10-CM | POA: Diagnosis present

## 2023-11-20 DIAGNOSIS — J90 Pleural effusion, not elsewhere classified: Secondary | ICD-10-CM | POA: Diagnosis not present

## 2023-11-20 LAB — BASIC METABOLIC PANEL WITH GFR
Anion gap: 9 (ref 5–15)
BUN: 21 mg/dL (ref 8–23)
CO2: 21 mmol/L — ABNORMAL LOW (ref 22–32)
Calcium: 8.2 mg/dL — ABNORMAL LOW (ref 8.9–10.3)
Chloride: 107 mmol/L (ref 98–111)
Creatinine, Ser: 0.8 mg/dL (ref 0.44–1.00)
GFR, Estimated: >60 mL/min (ref >60)
Glucose, Bld: 195 mg/dL — ABNORMAL HIGH (ref 70–99)
Potassium: 4.3 mmol/L (ref 3.5–5.1)
Sodium: 137 mmol/L (ref 135–145)

## 2023-11-20 LAB — CBC
HCT: 32 % — ABNORMAL LOW (ref 36.0–46.0)
Hemoglobin: 10.6 g/dL — ABNORMAL LOW (ref 12.0–15.0)
MCH: 34.3 pg — ABNORMAL HIGH (ref 26.0–34.0)
MCHC: 33.1 g/dL (ref 30.0–36.0)
MCV: 103.6 fL — ABNORMAL HIGH (ref 80.0–100.0)
Platelets: 218 10*3/uL (ref 150–400)
RBC: 3.09 MIL/uL — ABNORMAL LOW (ref 3.87–5.11)
RDW: 13.9 % (ref 11.5–15.5)
WBC: 13.7 10*3/uL — ABNORMAL HIGH (ref 4.0–10.5)
nRBC: 0 % (ref 0.0–0.2)

## 2023-11-20 LAB — ECHOCARDIOGRAM LIMITED
Height: 63 in
S' Lateral: 2.7 cm
Weight: 1760 [oz_av]

## 2023-11-20 LAB — POCT ACTIVATED CLOTTING TIME: Activated Clotting Time: 141 s

## 2023-11-20 MED ORDER — ATORVASTATIN CALCIUM 80 MG PO TABS
80.0000 mg | ORAL_TABLET | Freq: Every day | ORAL | Status: DC
  Administered 2023-11-20 – 2023-11-30 (×11): 80 mg via ORAL
  Filled 2023-11-20 (×10): qty 1

## 2023-11-20 NOTE — Progress Notes (Signed)
 6 french sheath aspirated and removed from left femoral artery.  No hematoma present prior to removal.  Manual pressure applied for 30 minutes. No hematoma present. No ecchymosis present distal to sheath site. Tegaderm dressing with gauze applied, bedrest instructions given.    Bilateral dp not dopplerable at baseline and pt pulses present with doppler and marked.       Bedrest begins at 04:15  Ludie Dunks, RN

## 2023-11-20 NOTE — TOC Initial Note (Signed)
 Transition of Care [facility]) - Initial/Assessment Note    Patient Details  Name: Kathryn Cuevas MRN: [id number] Date of Birth: [birth year]  [facility] of [facility]) CM/SW Contact:    Arlana JINNY Nicholaus [place] Phone Number: [phone] 11/20/2023, 2:25 PM  Clinical Narrative:   HF CSW met with patient and her son at bedside. Patient stated that she lives alone. Patient stated that she has a history of HH services. Patient cannot remember the name of the agency, and has current services with [facility] and PT services. Patient stated that she uses a walker. Patient does not drive and relies on family/friends for support. Patient stated that she has a PCP. CSW explained that a hospital follow up is typically scheduled closer towards dc. Patient agrees.    Patient son stated that the patient has a Charity fundraiser alert system at home. Patient son also stated that during the patients last hospitalization she was sent home with home 02  that was only used for a 3 week period and does not agree.   TOC will continue following.              Expected Discharge Plan: Home/Self Care Barriers to Discharge: Continued Medical Work up   Patient Goals and CMS Choice            Expected Discharge Plan and Services       Living arrangements for the past 2 months: Single Family Home                                      Prior Living Arrangements/Services Living arrangements for the past 2 months: Single Family Home Lives with:: Self Patient language and need for interpreter reviewed:: Yes Do you feel safe going back to the place where you live?: Yes      Need for Family Participation in Patient Care: Yes (Comment) Care giver support system in place?: Yes (comment)   Criminal Activity/Legal Involvement Pertinent to Current Situation/Hospitalization: No - Comment as needed  Activities of Daily Living      Permission Sought/Granted                  Emotional Assessment Appearance:: Appears stated  age Attitude/Demeanor/Rapport: Engaged Affect (typically observed): Appropriate Orientation: : Oriented to Self, Oriented to Place, Oriented to  Time, Oriented to Situation Alcohol / Substance Use: Not Applicable Psych Involvement: No (comment)  Admission diagnosis:  Angina pectoris (HCC) [I20.9] Cardiac tamponade [I31.4] Patient Active Problem List   Diagnosis Date Noted   Cardiac tamponade 11/20/2023   Angina pectoris (HCC) 11/19/2023   NSTEMI (non-ST elevated myocardial infarction) (HCC) 10/27/2023   Chronic obstructive pulmonary disease (HCC) 10/27/2023   Hypothyroidism 10/27/2023   Prediabetes 10/27/2023   Macrocytosis 10/27/2023   ACS (acute coronary syndrome) (HCC) 10/27/2023   Hypocalcemia 10/27/2023   Tobacco abuse 10/27/2023   Amaurosis fugax of right eye 09/22/2017   Lumbar radiculopathy 09/24/2015   PCP:  Okey Carlin Redbird, MD Pharmacy:   [facility] [phone] - [place], [place] - [address] AT [facility] [address] [place] [address] Phone: [phone] Fax: [phone]  [facility] [address] [place] [address] Phone: [phone] Fax: [phone]     Social Drivers of Health (SDOH) Social History: SDOH Screenings   Food Insecurity: No Food Insecurity (10/27/2023)  Housing: Low Risk  (10/27/2023)  Transportation  Needs: No Transportation Needs (10/27/2023)  Utilities: Not At Risk (10/27/2023)  Social Connections: Socially Isolated (10/28/2023)  Tobacco Use: High Risk (11/09/2023)   SDOH Interventions:     Readmission Risk Interventions    10/29/2023    2:00 PM  Readmission Risk Prevention Plan  Post Dischage Appt Complete  Medication Screening Complete  Transportation Screening Complete

## 2023-11-20 NOTE — Progress Notes (Signed)
  Echocardiogram 2D Echocardiogram has been performed.  Kathryn Cuevas 11/20/2023, 8:05 AM

## 2023-11-20 NOTE — Consult Note (Signed)
 Advanced Heart Failure Team Consult Note   Primary Physician: Okey Carlin Redbird, MD Cardiologist:  Alm Clay, MD  Reason for Consultation: Post PCI complication  HPI:    Kathryn Cuevas is seen today for evaluation of post PCI complication at the request of Dr. Swaziland.   Kathryn Cuevas is a 89 y.o. female with recent NSTEMI, HLD and hypothyroid.   Admitted 6/25 with chest pain. LHC with PCI of the proximal to mid LAD with DES x 2. Was a prolonged difficult procedure due to calcification and difficult access. She did have significant stenosis in the proximal RCA that was also calcified. There was discussion of staged PCI at a later date. She was discharged and scheduled for OP staged PCI of RCA at close f/u.   Echo 10/28/23: EF 45-50%, mild LVH, grade 1 DD, mild aortic valve regurgitation   She presented for staged PCI of RCA. LHC showed: Mid Cx 30% stenosed, prox RCA 80% stenosed, 2nd Diag 70% stenosed. Previously treated mid LAD and prox LAD. DES placed in prox RCA. Procedure was complicated by guide induced perforation of side branch of mid RCA and hemodynamic collapse resulting in pericardial effusion and tamponade requiring brief CPR. Underwent pericardiocentesis and pericardial drain placement. Pressors added and perforation sealed with covered stent in mid RCA. Transferred to CVICU for further monitoring. Limited echo yesterday showed EF 55-60%, RV normal, small-mod effusion over RV free wall and apex.   Son at bedside. Resting comfortably in place. Has had about 12mL drainage from pericardial drain. Remains on 0.03 of vaso.   Echo today with EF 60-65%, LV with no RWMA, mild concentric hypertrophy, RV normal, no evidence of cardiac tamponade, TR mod-severe  Home Medications Prior to Admission medications   Medication Sig Start Date End Date Taking? Authorizing Provider  aspirin  EC 81 MG tablet Take 1 tablet (81 mg total) by mouth daily. Swallow whole. 10/30/23  Yes Darci Pore, MD  atorvastatin  (LIPITOR) 40 MG tablet Take 1 tablet (40 mg total) by mouth daily. 11/09/23 02/07/24 Yes Fountain, Madison L, NP  clopidogrel  (PLAVIX ) 75 MG tablet Take 1 tablet (75 mg total) by mouth daily with breakfast. 10/30/23  Yes Darci Pore, MD  losartan  (COZAAR ) 25 MG tablet Take 0.5 tablets (12.5 mg total) by mouth daily. 10/29/23  Yes Darci Pore, MD  metoprolol  succinate (TOPROL -XL) 25 MG 24 hr tablet Take 1 tablet (25 mg total) by mouth daily. 10/30/23  Yes Darci Pore, MD  nitroGLYCERIN  (NITROSTAT ) 0.4 MG SL tablet Place 1 tablet (0.4 mg total) under the tongue every 5 (five) minutes x 3 doses as needed for chest pain. 10/29/23  Yes Darci Pore, MD  SYNTHROID  88 MCG tablet Take 88 mcg by mouth daily.   Yes [provider]   Past Medical History: Past Medical History:  Diagnosis Date   History of hyperkalemia    Sporadic episodes leading to cramping etc.   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   Osteoarthritis of both hips    As well as lumbar spine   Thyroid  disease    Past Surgical History: Past Surgical History:  Procedure Laterality Date   ANTERIOR FUSION LUMBAR SPINE     CORONARY ATHERECTOMY N/A 11/19/2023   Procedure: CORONARY ATHERECTOMY;  Surgeon: Swaziland, Peter M, MD;  Location: [facility] INVASIVE CV LAB;  Service: Cardiovascular;  Laterality: N/A;   CORONARY STENT INTERVENTION N/A 10/27/2023   Procedure: CORONARY STENT INTERVENTION;  Surgeon: Elmira Penman  J, MD;  Location: [facility] INVASIVE CV LAB;  Service: Cardiovascular;  Laterality: N/A;   CORONARY STENT INTERVENTION N/A 11/19/2023   Procedure: CORONARY STENT INTERVENTION;  Surgeon: Swaziland, Peter M, MD;  Location: [facility] INVASIVE CV LAB;  Service: Cardiovascular;  Laterality: N/A;   LEFT HEART CATH AND CORONARY ANGIOGRAPHY N/A 10/27/2023   Procedure: LEFT HEART CATH AND CORONARY ANGIOGRAPHY;  Surgeon: Elmira Newman PARAS, MD;  Location: [facility]  INVASIVE CV LAB;  Service: Cardiovascular;  Laterality: N/A;   LEFT HEART CATH AND CORONARY ANGIOGRAPHY N/A 11/19/2023   Procedure: LEFT HEART CATH AND CORONARY ANGIOGRAPHY;  Surgeon: Swaziland, Peter M, MD;  Location: [facility] INVASIVE CV LAB;  Service: Cardiovascular;  Laterality: N/A;   PERICARDIOCENTESIS N/A 11/19/2023   Procedure: PERICARDIOCENTESIS;  Surgeon: Swaziland, Peter M, MD;  Location: [facility] INVASIVE CV LAB;  Service: Cardiovascular;  Laterality: N/A;   TOTAL ABDOMINAL HYSTERECTOMY     With BSO   TOTAL HIP ARTHROPLASTY Bilateral    Family History: Family History  Problem Relation Age of Onset   Heart disease Mother        Congenital   Heart attack Father        Died at age 88 -was a long-term smoker with poor diet>    Social History: Social History   Socioeconomic History   Marital status: Widowed    Spouse name: Not on file   Number of children: Not on file   Years of education: Not on file   Highest education level: Not on file  Occupational History   Not on file  Tobacco Use   Smoking status: Every Day    Current packs/day: 1.00    Average packs/day: 1 pack/day for 62.0 years (62.0 ttl pk-yrs)    Types: Cigarettes   Smokeless tobacco: Never  Substance and Sexual Activity   Alcohol use: Not Currently   Drug use: Not Currently   Sexual activity: Not on file  Other Topics Concern   Not on file  Social History Narrative   Widowed mother of 2 with 4 grandchildren and 5 great-grandchildren.   She lives alone.     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  She has to use a walker but does do some stretching and PT exercises.   She is a current smoker of roughly 1 pack a day for over 62 years.   Social Drivers of Corporate investment banker Strain: Not on file  Food Insecurity: No Food Insecurity (10/27/2023)   Hunger Vital Sign    Worried About Running  Out of Food in the Last Year: Never true    Ran Out of Food in the Last Year: Never true  Transportation Needs: No Transportation Needs (10/27/2023)   PRAPARE - Administrator, Civil Service (Medical): No    Lack of Transportation (Non-Medical): No  Physical Activity: Not on file  Stress: Not on file  Social Connections: Socially Isolated (10/28/2023)   Social Connection and Isolation Panel    Frequency of Communication with Friends and Family: More than three times a week    Frequency of Social Gatherings with Friends and Family: Once a week    Attends Religious Services: Never    Database administrator or Organizations: No    Attends Banker Meetings: Never    Marital Status: Widowed  Allergies:  Allergies  Allergen Reactions   Levothyroxine  Other (See Comments)    Shoulder/neck pain. Can tolerate Synthroid .    Augmentin [Amoxicillin-Pot Clavulanate] Rash   Sulfa Antibiotics Rash   Objective:    Vital Signs:   Temp:  [97 F (36.1 C)-97.9 F (36.6 C)] 97.9 F (36.6 C) (06/27 0400) Pulse Rate:  [46-77] 59 (06/27 0815) Resp:  [0-33] 9 (06/27 0815) BP: (50-117)/(37-72) 84/71 (06/26 2000) SpO2:  [84 %-100 %] 99 % (06/27 0815) Arterial Line BP: (94-291)/(37-286) 107/44 (06/27 0815) Last BM Date :  (PTA)  Weight change: Filed Weights   11/19/23 0733  Weight: 49.9 kg   Intake/Output:   Intake/Output Summary (Last 24 hours) at 11/20/2023 1008 Last data filed at 11/20/2023 0921 Gross per 24 hour  Intake 1520.47 ml  Output 239 ml  Net 1281.47 ml    Physical Exam    General:  elderly appearing.  No respiratory difficulty Neck: supple. JVD flat.  Cor: PMI nondisplaced. Brady/regular rate & rhythm. No rubs, gallops or murmurs. Lungs: clear Extremities: no cyanosis, clubbing, rash, edema  Neuro: alert & oriented x 3. Moves all 4 extremities w/o difficulty. Affect pleasant.   Telemetry   SB-NSR 50s-60s (Personally reviewed)    EKG    NSR 60 bpm  ST and T wave abnormality  Labs   Basic Metabolic Panel: Recent Labs  Lab 11/20/23 0633  NA 137  K 4.3  CL 107  CO2 21*  GLUCOSE 195*  BUN 21  CREATININE 0.80  CALCIUM  8.2*    Liver Function Tests: No results for input(s): AST, ALT, ALKPHOS, BILITOT, PROT, ALBUMIN in the last 168 hours. No results for input(s): LIPASE, AMYLASE in the last 168 hours. No results for input(s): AMMONIA in the last 168 hours.  CBC: Recent Labs  Lab 11/20/23 0633  WBC 13.7*  HGB 10.6*  HCT 32.0*  MCV 103.6*  PLT 218    Cardiac Enzymes: No results for input(s): CKTOTAL, CKMB, CKMBINDEX, TROPONINI in the last 168 hours.  BNP: BNP (last 3 results) No results for input(s): BNP in the last 8760 hours.  ProBNP (last 3 results) No results for input(s): PROBNP in the last 8760 hours.   CBG: No results for input(s): GLUCAP in the last 168 hours.  Coagulation Studies: No results for input(s): LABPROT, INR in the last 72 hours.   Imaging   ECHOCARDIOGRAM LIMITED Result Date: 11/20/2023    ECHOCARDIOGRAM LIMITED REPORT   Patient Name:   Kathryn Cuevas Date of Exam: 11/20/2023 Medical Rec #:  [id number]   Height:       63.0 in Accession #:    [id number]  Weight:       110.0 lb Date of Birth:  13-Jun-1931   BSA:          1.500 m Patient Age:    89 years    BP:           128/58 mmHg Patient Gender: F           HR:           62 bpm. Exam Location:  Inpatient Procedure: Limited Echo, Cardiac Doppler and Color Doppler (Both Spectral and            Color Flow Doppler were utilized during procedure). Indications:    I31.3 Pericardial effusion (noninflammatory)  History:        Patient has prior history of Echocardiogram examinations, most  recent 11/19/2023. Previous Myocardial Infarction and CAD,                 Signs/Symptoms:Chest Pain; Risk Factors:Current Smoker.                 Pericardial effusion. Tamponade.  Sonographer:    Ellouise Mose RDCS Referring  Phys: [id number] PETER M SWAZILAND IMPRESSIONS  1. Left ventricular ejection fraction, by estimation, is 60 to 65%. The left ventricle has normal function. The left ventricle has no regional wall motion abnormalities. There is mild concentric left ventricular hypertrophy.  2. Right ventricular systolic function is normal. The right ventricular size is normal. There is normal pulmonary artery systolic pressure. The estimated right ventricular systolic pressure is 34.4 mmHg.  3. There is no evidence of cardiac tamponade.  4. Tricuspid valve regurgitation is moderate to severe.  5. The aortic valve is tricuspid. There is mild calcification of the aortic valve. Aortic valve regurgitation is mild to moderate. Aortic valve sclerosis/calcification is present, without any evidence of aortic stenosis.  6. The inferior vena cava is dilated in size with <50% respiratory variability, suggesting right atrial pressure of 15 mmHg. Conclusion(s)/Recommendation(s): Pericardial effusion no longer present. FINDINGS  Left Ventricle: Left ventricular ejection fraction, by estimation, is 60 to 65%. The left ventricle has normal function. The left ventricle has no regional wall motion abnormalities. The left ventricular internal cavity size was normal in size. There is  mild concentric left ventricular hypertrophy. Right Ventricle: The right ventricular size is normal. Right ventricular systolic function is normal. There is normal pulmonary artery systolic pressure. The tricuspid regurgitant velocity is 2.20 m/s, and with an assumed right atrial pressure of 15 mmHg, the estimated right ventricular systolic pressure is 34.4 mmHg. Pericardium: There is no evidence of pericardial effusion. There is no evidence of cardiac tamponade. Tricuspid Valve: Tricuspid valve regurgitation is moderate to severe. Aortic Valve: The aortic valve is tricuspid. There is mild calcification of the aortic valve. Aortic valve regurgitation is mild to moderate. Aortic  valve sclerosis/calcification is present, without any evidence of aortic stenosis. Venous: The inferior vena cava is dilated in size with less than 50% respiratory variability, suggesting right atrial pressure of 15 mmHg. Additional Comments: Spectral Doppler performed. Color Doppler performed.  LEFT VENTRICLE PLAX 2D LVIDd:         4.00 cm LVIDs:         2.70 cm LV PW:         1.00 cm LV IVS:        0.90 cm  IVC IVC diam: 2.50 cm  AORTA Ao Asc diam: 3.20 cm TRICUSPID VALVE TR Peak grad:   19.4 mmHg TR Vmax:        220.00 cm/s Toribio Fuel MD Electronically signed by Toribio Fuel MD Signature Date/Time: 11/20/2023/8:50:13 AM    Final    CARDIAC CATHETERIZATION Result Date: 11/19/2023   Non-stenotic Prox RCA lesion was previously treated.   Non-stenotic Mid RCA lesion.   Non-stenotic Ost RCA to Prox RCA lesion.   A covered stent was successfully placed using a STENT PK PAPYRUS 4.0X15.   A drug-eluting stent was successfully placed using a STENT SYNERGY XD 4.0X8.   Post intervention, there is a 0% residual stenosis.   Post intervention, there is a 0% residual stenosis.   Recommend uninterrupted dual antiplatelet therapy with Aspirin  81mg  daily and Clopidogrel  75mg  daily for a minimum of 12 months (ACS-Class I recommendation). Findings of wire perforation of tiny side branch of  mid RCA resulting in pericardial effusion and tamponade Successful pericardiocentesis from subxyphoid approach Successful sealing of perforation by placement of Covered stent in the mid RCA Stenting of the ostial RCA to ensure coverage of focal dissection Plan: monitor in ICU. Will leave pericardial drain in place. Repeat Echo in am. Continue DAPT for one year.   ECHOCARDIOGRAM LIMITED Result Date: 11/19/2023    ECHOCARDIOGRAM LIMITED REPORT   Patient Name:   Kathryn Cuevas Date of Exam: 11/19/2023 Medical Rec #:  [id number]   Height:       63.0 in Accession #:    [id number]  Weight:       110.0 lb Date of Birth:  28-Dec-1931   BSA:           1.500 m Patient Age:    89 years    BP:           110/72 mmHg Patient Gender: F           HR:           60 bpm. Exam Location:  Inpatient Procedure: 2D Echo (Both Spectral and Color Flow Doppler were utilized during            procedure). Indications:     Pericardial effusion I31.3  History:         Patient has prior history of Echocardiogram examinations, most                  recent 11/19/2023. Previous Myocardial Infarction, COPD; Risk                  Factors:Current Smoker.  Sonographer:     Thea Norlander RCS Referring Phys:  [phone] PETER M SWAZILAND Diagnosing Phys: Toribio Fuel MD IMPRESSIONS  1. Left ventricular ejection fraction, by estimation, is 55 to 60%. The left ventricle has normal function.  2. Right ventricular systolic function is normal.  3. Small to moderate effusion over RV free wall and apex.  4. There is mild calcification of the aortic valve. Conclusion(s)/Recommendation(s): Patient currently in cath lab having effusion drained. FINDINGS  Left Ventricle: Left ventricular ejection fraction, by estimation, is 55 to 60%. The left ventricle has normal function. Right Ventricle: Right ventricular systolic function is normal. Pericardium: Small to moderate effusion over RV free wall and apex. The pericardial effusion is anterior to the right ventricle and surrounding the apex. Aortic Valve: There is mild calcification of the aortic valve. Toribio Fuel MD Electronically signed by Toribio Fuel MD Signature Date/Time: 11/19/2023/5:42:28 PM    Final (Updated)    ECHOCARDIOGRAM LIMITED Result Date: 11/19/2023    ECHOCARDIOGRAM LIMITED REPORT   Patient Name:   Kathryn Cuevas Date of Exam: 11/19/2023 Medical Rec #:  [id number]   Height:       63.0 in Accession #:    [id number]  Weight:       110.0 lb Date of Birth:  02-Nov-1931   BSA:          1.500 m Patient Age:    89 years    BP:           140/60 mmHg Patient Gender: F           HR:           55 bpm. Exam Location:  Inpatient Procedure: 2D Echo  and Limited Echo (Both Spectral and Color Flow Doppler were            utilized during procedure). Indications:  Pericardial Effusion  History:         Patient has prior history of Echocardiogram examinations, most                  recent 10/28/2023. Risk Factors:Current Smoker.  Sonographer:     Philomena Daring Referring Phys:  [id number] EZUZM M SWAZILAND Diagnosing Phys: Toribio Fuel MD IMPRESSIONS  1. Left ventricular ejection fraction, by estimation, is 60 to 65%. The left ventricle has normal function. There is mild concentric left ventricular hypertrophy.  2. Right ventricular systolic function is normal. The right ventricular size is normal.  3. Left atrial size was mildly dilated.  4. Trivial pericardial effusion. EF improved since previous         Study amended from previous. FINDINGS  Left Ventricle: Left ventricular ejection fraction, by estimation, is 60 to 65%. The left ventricle has normal function. The left ventricular internal cavity size was normal in size. There is mild concentric left ventricular hypertrophy. Right Ventricle: The right ventricular size is normal. Right ventricular systolic function is normal. Left Atrium: Left atrial size was mildly dilated. Pericardium: Trivial pericardial effusion. EF improved since previous Study amended from previous. Trivial pericardial effusion is present. The pericardial effusion is anterior to the right ventricle. Toribio Fuel MD Electronically signed by Toribio Fuel MD Signature Date/Time: 11/19/2023/2:06:31 PM    Final (Updated)    CARDIAC CATHETERIZATION Result Date: 11/19/2023   Mid Cx lesion is 30% stenosed.   Prox RCA lesion is 80% stenosed.   2nd Diag lesion is 70% stenosed.   Non-stenotic Mid LAD lesion was previously treated.   Non-stenotic Prox LAD lesion was previously treated.   A drug-eluting stent was successfully placed using a STENT SYNERGY XD 4.0X38.   Post intervention, there is a 0% residual stenosis.   Recommend uninterrupted dual  antiplatelet therapy with Aspirin  81mg  daily and Clopidogrel  75mg  daily for a minimum of 12 months (ACS-Class I recommendation). Successful PCI of the proximal RCA with orbital atherectomy and stenting. Procedure was complicated by guide induced perforation and hemodynamic collapse that responded to brief CPR, pressors and balloon sealing of perforation. Successful DES x 1. No Effusion on Echo. Plan: observe overnight. DAPT for one year. Anticipate DC tomorrow if no further complications.     Medications:     Current Medications:  aspirin  EC  81 mg Oral Daily   atorvastatin   80 mg Oral Daily   Chlorhexidine  Gluconate Cloth  6 each Topical Daily   clopidogrel   75 mg Oral Q breakfast   levothyroxine   88 mcg Oral Daily   sodium chloride  flush  3 mL Intravenous Q12H   sodium chloride  flush  3 mL Intravenous Q12H    Infusions:  sodium chloride      sodium chloride      sodium chloride      norepinephrine  (LEVOPHED ) Adult infusion Stopped (11/19/23 2304)   vasopressin  0.03 Units/min (11/20/23 0815)      Patient Profile   Kathryn Cuevas is a 89 y.o. female with recent NSTEMI, HLD and hypothyroid. Admitted post PCI post complication.   Assessment/Plan  CAD> post PCI perforation> pericardial effusion> tamponade> brief cardiac arrest  - LHC this admission with mid Cx 30% stenosed, prox RCA 80% stenosed, 2nd Diag 70% stenosed. Previously treated mid LAD and prox LAD. DES placed in prox RCA - Perforated side branch of mid RCA during procedure resulting in effusion>tamponade.  - S/p pericardiocentesis and pericardial drain placement - ~12 mL output from drain. If drainage continues to  slow down suspect we can pull drain later - Required pressor support post arrest. Now off NE. Continue to wean off vaso, weaned to 0.02 while in room - Plan for repeat limited echo prior to discharge - Continue Plavix  and ASA - Increase atorvastatin  40>80 mg daily - LVEF recovered, was 45-50% post NSTEMI 10/28/23.  Echo today with EF 60-65%, LV with no RWMA, mild concentric hypertrophy, RV normal, no evidence of cardiac tamponade, TR mod-severe - Previously on losartan  and BB at home. Start as able once pressors off.  - Denies CP  Ambulate.   Plan for possible discharge tomorrow.    Length of Stay: 0  Kathryn LITTIE Coe, NP  11/20/2023, 10:08 AM  Advanced Heart Failure Team Pager [phone] (M-F; 7a - 5p)  Please contact [facility] for night-coverage after hours (4p -7a ) and weekends on [email or web address]

## 2023-11-21 ENCOUNTER — Inpatient Hospital Stay (HOSPITAL_COMMUNITY)

## 2023-11-21 DIAGNOSIS — J449 Chronic obstructive pulmonary disease, unspecified: Secondary | ICD-10-CM | POA: Diagnosis not present

## 2023-11-21 DIAGNOSIS — I209 Angina pectoris, unspecified: Secondary | ICD-10-CM | POA: Diagnosis not present

## 2023-11-21 LAB — BASIC METABOLIC PANEL WITH GFR
Anion gap: 6 (ref 5–15)
BUN: 28 mg/dL — ABNORMAL HIGH (ref 8–23)
CO2: 23 mmol/L (ref 22–32)
Calcium: 8.3 mg/dL — ABNORMAL LOW (ref 8.9–10.3)
Chloride: 107 mmol/L (ref 98–111)
Creatinine, Ser: 0.87 mg/dL (ref 0.44–1.00)
GFR, Estimated: >60 mL/min (ref >60)
Glucose, Bld: 127 mg/dL — ABNORMAL HIGH (ref 70–99)
Potassium: 4.3 mmol/L (ref 3.5–5.1)
Sodium: 136 mmol/L (ref 135–145)

## 2023-11-21 LAB — CBC
HCT: 28.7 % — ABNORMAL LOW (ref 36.0–46.0)
Hemoglobin: 9.6 g/dL — ABNORMAL LOW (ref 12.0–15.0)
MCH: 34.2 pg — ABNORMAL HIGH (ref 26.0–34.0)
MCHC: 33.4 g/dL (ref 30.0–36.0)
MCV: 102.1 fL — ABNORMAL HIGH (ref 80.0–100.0)
Platelets: 188 10*3/uL (ref 150–400)
RBC: 2.81 MIL/uL — ABNORMAL LOW (ref 3.87–5.11)
RDW: 13.9 % (ref 11.5–15.5)
WBC: 18.6 10*3/uL — ABNORMAL HIGH (ref 4.0–10.5)
nRBC: 0 % (ref 0.0–0.2)

## 2023-11-21 LAB — MAGNESIUM: Magnesium: 1.9 mg/dL (ref 1.7–2.4)

## 2023-11-21 MED ORDER — PIPERACILLIN-TAZOBACTAM 3.375 G IVPB
3.3750 g | Freq: Three times a day (TID) | INTRAVENOUS | Status: DC
  Administered 2023-11-21 – 2023-11-25 (×12): 3.375 g via INTRAVENOUS
  Filled 2023-11-21 (×12): qty 50

## 2023-11-21 MED ORDER — MAGNESIUM SULFATE 2 GM/50ML IV SOLN
2.0000 g | Freq: Once | INTRAVENOUS | Status: AC
  Administered 2023-11-21: 2 g via INTRAVENOUS
  Filled 2023-11-21: qty 50

## 2023-11-21 MED ORDER — VANCOMYCIN HCL 500 MG/100ML IV SOLN
500.0000 mg | INTRAVENOUS | Status: DC
  Administered 2023-11-22 – 2023-11-24 (×3): 500 mg via INTRAVENOUS
  Filled 2023-11-21 (×5): qty 100

## 2023-11-21 MED ORDER — VANCOMYCIN HCL IN DEXTROSE 1-5 GM/200ML-% IV SOLN
1000.0000 mg | Freq: Once | INTRAVENOUS | Status: AC
  Administered 2023-11-21: 1000 mg via INTRAVENOUS
  Filled 2023-11-21: qty 200

## 2023-11-21 NOTE — Plan of Care (Signed)
  Problem: Education: Goal: Understanding of CV disease, CV risk reduction, and recovery process will improve Outcome: Progressing   Problem: Cardiovascular: Goal: Ability to achieve and maintain adequate cardiovascular perfusion will improve Outcome: Progressing   Problem: Health Behavior/Discharge Planning: Goal: Ability to safely manage health-related needs after discharge will improve Outcome: Progressing   

## 2023-11-21 NOTE — Progress Notes (Signed)
 Pharmacy Antibiotic Note  Kathryn Cuevas is a 89 y.o. female admitted on 11/19/2023 with emergent endocarditis due to pericardial drain placement.  Drain was removed on 11/21/23. Pharmacy has been consulted for vancomycin and Zosyn dosing.  11/21/23: WBC increased from 13.7 to 18.6, afebrile, Scr 0.87.   Plan: Vancomycin 1000 mg x1, then 500 mg IV every 24 hours (eAUC 419, Scr 0.87, TBW 50 kg) Zosyn 3.375 gm IV every 8 hours F/u renal function, LOT, and clinical signs of improvement  Height: 5' 3 (160 cm) Weight: 49.9 kg (110 lb) IBW/kg (Calculated) : 52.4  Temp (24hrs), Avg:97.7 F (36.5 C), Min:97.3 F (36.3 C), Max:97.9 F (36.6 C)  Recent Labs  Lab 11/20/23 0633 11/21/23 0440  WBC 13.7* 18.6*  CREATININE 0.80 0.87    Estimated Creatinine Clearance: 34.5 mL/min (by C-G formula based on SCr of 0.87 mg/dL).    Allergies  Allergen Reactions   Levothyroxine  Other (See Comments)    Shoulder/neck pain. Can tolerate Synthroid .    Augmentin [Amoxicillin-Pot Clavulanate] Rash   Sulfa Antibiotics Rash    Antimicrobials this admission: Vancomycin 6/28 >>  Zosyn 6/28 >>   Thank you for allowing pharmacy to be a part of this patient's care.  Morna Breach, PharmD PGY2 Cardiology Pharmacy Resident 11/21/2023 10:24 AM

## 2023-11-21 NOTE — Consult Note (Signed)
 Advanced Heart Failure Team Consult Note   Primary Physician: Okey Carlin Redbird, MD Cardiologist:  Alm Clay, MD  Reason for Consultation: Cardiac tamponade  Interval hx  - 10cc output from pericardial drain; removed today with no issues.  - Weaning O2 as able.  - Off all pressors - No events overnight.   Objective:    Vital Signs:   Temp:  [97.3 F (36.3 C)-97.9 F (36.6 C)] 97.3 F (36.3 C) (06/28 0700) Pulse Rate:  [59-79] 73 (06/28 1000) Resp:  [10-26] 17 (06/28 1000) BP: (100-108)/(51-55) 106/55 (06/28 1000) SpO2:  [88 %-99 %] 94 % (06/28 1000) Arterial Line BP: (89-127)/(39-105) 115/56 (06/28 1000) Last BM Date :  (PTA)  Weight change: Filed Weights   11/19/23 0733  Weight: 49.9 kg   Intake/Output:   Intake/Output Summary (Last 24 hours) at 11/21/2023 1013 Last data filed at 11/21/2023 0045 Gross per 24 hour  Intake 110.57 ml  Output 285 ml  Net -174.43 ml    Physical Exam    General:  comfortable.  Neck: supple. JVP 9 Cor:RRR Lungs: clear Extremities: warm, no edema Neuro: alert & oriented x 3. Moves all 4 extremities w/o difficulty. Affect pleasant.   Telemetry   NSR 80s  EKG    NSR 60 bpm ST and T wave abnormality  Labs   Basic Metabolic Panel: Recent Labs  Lab 11/20/23 0633 11/21/23 0440  NA 137 136  K 4.3 4.3  CL 107 107  CO2 21* 23  GLUCOSE 195* 127*  BUN 21 28*  CREATININE 0.80 0.87  CALCIUM  8.2* 8.3*  MG  --  1.9    Liver Function Tests: No results for input(s): AST, ALT, ALKPHOS, BILITOT, PROT, ALBUMIN in the last 168 hours. No results for input(s): LIPASE, AMYLASE in the last 168 hours. No results for input(s): AMMONIA in the last 168 hours.  CBC: Recent Labs  Lab 11/20/23 0633 11/21/23 0440  WBC 13.7* 18.6*  HGB 10.6* 9.6*  HCT 32.0* 28.7*  MCV 103.6* 102.1*  PLT 218 188    Imaging   No results found.    Medications:     Current Medications:  aspirin  EC  81 mg Oral Daily    atorvastatin   80 mg Oral Daily   Chlorhexidine  Gluconate Cloth  6 each Topical Daily   clopidogrel   75 mg Oral Q breakfast   levothyroxine   88 mcg Oral Daily   sodium chloride  flush  3 mL Intravenous Q12H   sodium chloride  flush  3 mL Intravenous Q12H    Infusions:  norepinephrine  (LEVOPHED ) Adult infusion 2 mcg/min (11/21/23 0045)   vasopressin  Stopped (11/20/23 1742)      Patient Profile   Kathryn Cuevas is a 89 y.o. female with recent NSTEMI, HLD and hypothyroid. Admitted post PCI post complication.   Assessment/Plan  CAD> post PCI perforation> pericardial effusion> tamponade> brief cardiac arrest  - LHC this admission with mid Cx 30% stenosed, prox RCA 80% stenosed, 2nd Diag 70% stenosed. Previously treated mid LAD and prox LAD. DES placed in prox RCA - Perforated side branch of mid RCA during procedure resulting in effusion>tamponade.  - Continue Plavix  and ASA - Continue lipitor 80mg  daily - LVEF recovered, was 45-50% post NSTEMI 10/28/23. Echo today with EF 60-65%, LV with no RWMA, mild concentric hypertrophy, RV normal, no evidence of cardiac tamponade, TR mod-severe - Euvolemic on exam  - Pericardial drain with 10cc output over the past 12H. Removed at bedside today.  - Off  pressors now - Remains on 8-10L O2 [place]. However, as per family she has had an intermittent O2 requirement due to long history of tobacco use.  Weaning as able - Transfer to floor later today if she does well.  - Repeat TTE ordered  Length of Stay: 1  Maryrose Colvin, DO  11/21/2023, 10:13 AM  Advanced Heart Failure Team Pager [phone] (M-F; 7a - 5p)  Please contact [facility] for night-coverage after hours (4p -7a ) and weekends on [email or web address]   CRITICAL CARE Performed by: Ria Commander   Total critical care time: 35 minutes  Critical care time was exclusive of separately billable procedures and treating other patients.  Critical care was necessary to treat or prevent imminent or  life-threatening deterioration.  Critical care was time spent personally by me on the following activities: development of treatment plan with patient and/or surrogate as well as nursing, discussions with consultants, evaluation of patient's response to treatment, examination of patient, obtaining history from patient or surrogate, ordering and performing treatments and interventions, ordering and review of laboratory studies, ordering and review of radiographic studies, pulse oximetry and re-evaluation of patient's condition.

## 2023-11-22 ENCOUNTER — Other Ambulatory Visit: Payer: Self-pay

## 2023-11-22 ENCOUNTER — Inpatient Hospital Stay (HOSPITAL_COMMUNITY)

## 2023-11-22 DIAGNOSIS — J90 Pleural effusion, not elsewhere classified: Secondary | ICD-10-CM | POA: Diagnosis not present

## 2023-11-22 DIAGNOSIS — R9431 Abnormal electrocardiogram [ECG] [EKG]: Secondary | ICD-10-CM

## 2023-11-22 DIAGNOSIS — I209 Angina pectoris, unspecified: Secondary | ICD-10-CM | POA: Diagnosis not present

## 2023-11-22 LAB — BODY FLUID CELL COUNT WITH DIFFERENTIAL
Eos, Fluid: 0 %
Lymphs, Fluid: 16 %
Monocyte-Macrophage-Serous Fluid: 19 % — ABNORMAL LOW (ref 50–90)
Neutrophil Count, Fluid: 65 % — ABNORMAL HIGH (ref 0–25)
Total Nucleated Cell Count, Fluid: 2033 uL — ABNORMAL HIGH (ref 0–1000)

## 2023-11-22 LAB — BASIC METABOLIC PANEL WITH GFR
Anion gap: 3 — ABNORMAL LOW (ref 5–15)
BUN: 26 mg/dL — ABNORMAL HIGH (ref 8–23)
CO2: 23 mmol/L (ref 22–32)
Calcium: 7.9 mg/dL — ABNORMAL LOW (ref 8.9–10.3)
Chloride: 104 mmol/L (ref 98–111)
Creatinine, Ser: 0.8 mg/dL (ref 0.44–1.00)
GFR, Estimated: >60 mL/min (ref >60)
Glucose, Bld: 114 mg/dL — ABNORMAL HIGH (ref 70–99)
Potassium: 3.7 mmol/L (ref 3.5–5.1)
Sodium: 130 mmol/L — ABNORMAL LOW (ref 135–145)

## 2023-11-22 LAB — ECHOCARDIOGRAM LIMITED
Calc EF: 58.2 %
Height: 63 in
Single Plane A2C EF: 58.1 %
Single Plane A4C EF: 59.6 %
Weight: 1760 [oz_av]

## 2023-11-22 LAB — CBC
HCT: 26.3 % — ABNORMAL LOW (ref 36.0–46.0)
Hemoglobin: 8.8 g/dL — ABNORMAL LOW (ref 12.0–15.0)
MCH: 34 pg (ref 26.0–34.0)
MCHC: 33.5 g/dL (ref 30.0–36.0)
MCV: 101.5 fL — ABNORMAL HIGH (ref 80.0–100.0)
Platelets: 169 10*3/uL (ref 150–400)
RBC: 2.59 MIL/uL — ABNORMAL LOW (ref 3.87–5.11)
RDW: 13.9 % (ref 11.5–15.5)
WBC: 13 10*3/uL — ABNORMAL HIGH (ref 4.0–10.5)
nRBC: 0 % (ref 0.0–0.2)

## 2023-11-22 LAB — MAGNESIUM: Magnesium: 2.1 mg/dL (ref 1.7–2.4)

## 2023-11-22 LAB — PROTEIN, PLEURAL OR PERITONEAL FLUID: Total protein, fluid: <3 g/dL

## 2023-11-22 LAB — GLUCOSE, CAPILLARY: Glucose-Capillary: 103 mg/dL — ABNORMAL HIGH (ref 70–99)

## 2023-11-22 LAB — PROTEIN, TOTAL: Total Protein: 4.9 g/dL — ABNORMAL LOW (ref 6.5–8.1)

## 2023-11-22 LAB — LACTATE DEHYDROGENASE, PLEURAL OR PERITONEAL FLUID: LD, Fluid: 275 U/L — ABNORMAL HIGH (ref 3–23)

## 2023-11-22 LAB — LACTATE DEHYDROGENASE: LDH: 293 U/L — ABNORMAL HIGH (ref 98–192)

## 2023-11-22 MED ORDER — AMIODARONE LOAD VIA INFUSION
150.0000 mg | Freq: Once | INTRAVENOUS | Status: AC
  Administered 2023-11-22: 150 mg via INTRAVENOUS
  Filled 2023-11-22: qty 83.34

## 2023-11-22 MED ORDER — SODIUM CHLORIDE 0.9% FLUSH
10.0000 mL | Freq: Two times a day (BID) | INTRAVENOUS | Status: DC
  Administered 2023-11-22 – 2023-11-30 (×7): 10 mL

## 2023-11-22 MED ORDER — MIDAZOLAM HCL 2 MG/2ML IJ SOLN: 1.0000 mg | Freq: Once | INTRAMUSCULAR | Status: AC

## 2023-11-22 MED ORDER — SODIUM CHLORIDE 0.9% FLUSH: 10.0000 mL | INTRAVENOUS | Status: DC | PRN

## 2023-11-22 MED ORDER — FUROSEMIDE 10 MG/ML IJ SOLN: 60.0000 mg | Freq: Once | INTRAMUSCULAR | Status: DC

## 2023-11-22 MED ORDER — AMIODARONE HCL IN DEXTROSE 360-4.14 MG/200ML-% IV SOLN
30.0000 mg/h | INTRAVENOUS | Status: DC
  Administered 2023-11-22 – 2023-11-24 (×6): 30 mg/h via INTRAVENOUS
  Filled 2023-11-22 (×4): qty 200

## 2023-11-22 MED ORDER — AMIODARONE HCL IN DEXTROSE 360-4.14 MG/200ML-% IV SOLN
60.0000 mg/h | INTRAVENOUS | Status: AC
  Administered 2023-11-22: 60 mg/h via INTRAVENOUS
  Filled 2023-11-22: qty 400

## 2023-11-22 MED ORDER — FUROSEMIDE 10 MG/ML IJ SOLN
80.0000 mg | Freq: Two times a day (BID) | INTRAMUSCULAR | Status: AC
  Administered 2023-11-22 – 2023-11-23 (×2): 80 mg via INTRAVENOUS
  Filled 2023-11-22 (×2): qty 8

## 2023-11-22 MED ORDER — MORPHINE SULFATE (PF) 2 MG/ML IV SOLN: 1.0000 mg | Freq: Once | INTRAVENOUS | Status: AC

## 2023-11-22 MED ORDER — POTASSIUM CHLORIDE CRYS ER 20 MEQ PO TBCR
40.0000 meq | EXTENDED_RELEASE_TABLET | Freq: Once | ORAL | Status: AC
  Administered 2023-11-22: 40 meq via ORAL
  Filled 2023-11-22: qty 2

## 2023-11-22 MED ORDER — MIDAZOLAM HCL 2 MG/2ML IJ SOLN
INTRAMUSCULAR | Status: AC
  Administered 2023-11-22: 1 mg via INTRAVENOUS
  Filled 2023-11-22: qty 2

## 2023-11-22 MED ORDER — MORPHINE SULFATE (PF) 2 MG/ML IV SOLN
INTRAVENOUS | Status: AC
  Administered 2023-11-22: 1 mg via INTRAVENOUS
  Filled 2023-11-22: qty 1

## 2023-11-22 NOTE — Progress Notes (Signed)
 Advanced Heart Failure Team Consult Note   Primary Physician: Okey Carlin Redbird, MD Cardiologist:  Alm Clay, MD  Reason for Consultation: Cardiac tamponade  Interval hx  - pericardial drain removed yesterday  - This AM on levophed  6 & vasopressin  with MAP ~65.  - Afib with RVR briefly today; now on amio gtt with rates 60s in NSR.   Objective:    Vital Signs:   Temp:  [97 F (36.1 C)-98.3 F (36.8 C)] 97 F (36.1 C) (06/29 0750) Pulse Rate:  [71-155] 120 (06/29 0800) Resp:  [12-27] 19 (06/29 0800) BP: (81-139)/(45-71) 90/51 (06/29 0800) SpO2:  [82 %-100 %] 92 % (06/29 0800) Arterial Line BP: (76-128)/(36-64) 88/51 (06/29 0750) Last BM Date : 11/21/23  Weight change: Filed Weights   11/19/23 0733  Weight: 49.9 kg   Intake/Output:   Intake/Output Summary (Last 24 hours) at 11/22/2023 1108 Last data filed at 11/22/2023 0800 Gross per 24 hour  Intake 194.82 ml  Output 500 ml  Net -305.18 ml    Physical Exam    General:  comfortable Neck: JVP 8 Cor:RRR Lungs: CTA Extremities: warm, no edema Neuro: AAOX3  Telemetry   NSR 70s  EKG    NSR 60 bpm ST and T wave abnormality  Labs   Basic Metabolic Panel: Recent Labs  Lab 11/20/23 0633 11/21/23 0440 11/22/23 0500  NA 137 136 130*  K 4.3 4.3 3.7  CL 107 107 104  CO2 21* 23 23  GLUCOSE 195* 127* 114*  BUN 21 28* 26*  CREATININE 0.80 0.87 0.80  CALCIUM  8.2* 8.3* 7.9*  MG  --  1.9 2.1    Liver Function Tests: No results for input(s): AST, ALT, ALKPHOS, BILITOT, PROT, ALBUMIN in the last 168 hours. No results for input(s): LIPASE, AMYLASE in the last 168 hours. No results for input(s): AMMONIA in the last 168 hours.  CBC: Recent Labs  Lab 11/20/23 0633 11/21/23 0440 11/22/23 0500  WBC 13.7* 18.6* 13.0*  HGB 10.6* 9.6* 8.8*  HCT 32.0* 28.7* 26.3*  MCV 103.6* 102.1* 101.5*  PLT 218 188 169    Imaging   ECHOCARDIOGRAM LIMITED Result Date: 11/22/2023     ECHOCARDIOGRAM LIMITED REPORT   Patient Name:   Kathryn Cuevas Date of Exam: 11/22/2023 Medical Rec #:  [id number]   Height:       63.0 in Accession #:    [id number]  Weight:       110.0 lb Date of Birth:  Jul 16, 1931   BSA:          1.500 m Patient Age:    89 years    BP:           104/49 mmHg Patient Gender: F           HR:           140 bpm. Exam Location:  Inpatient Procedure: Limited Echo, Cardiac Doppler and Color Doppler (Both Spectral and            Color Flow Doppler were utilized during procedure). Indications:    I31.3 Pericardial effusion (noninflammatory); R94.31 Abnormal                 EKG  History:        Patient has prior history of Echocardiogram examinations, most                 recent 11/20/2023. Previous Myocardial Infarction and CAD, COPD;  Risk Factors:Current Smoker. Pericardial effusion.  Sonographer:    Ellouise Mose RDCS Referring Phys: [id number] Evangeline Utley  Sonographer Comments: High Fowler's position IMPRESSIONS  1. No pericardial effusion is present.  2. Left ventricular ejection fraction, by estimation, is 70 to 75%. The left ventricle has hyperdynamic function. The left ventricle has no regional wall motion abnormalities. Left ventricular diastolic parameters are indeterminate.  3. Right ventricular systolic function is mildly reduced. The right ventricular size is mildly enlarged. There is normal pulmonary artery systolic pressure. The estimated right ventricular systolic pressure is 28.8 mmHg.  4. Trivial mitral valve regurgitation.  5. Tricuspid valve regurgitation is moderate.  6. The aortic valve is tricuspid. Aortic valve regurgitation is mild. Aortic valve sclerosis is present, with no evidence of aortic valve stenosis.  7. The inferior vena cava is dilated in size with >50% respiratory variability, suggesting right atrial pressure of 8 mmHg. Comparison(s): No significant change from prior study. FINDINGS  Left Ventricle: Left ventricular ejection fraction, by  estimation, is 70 to 75%. The left ventricle has hyperdynamic function. The left ventricle has no regional wall motion abnormalities. Left ventricular diastolic parameters are indeterminate. Right Ventricle: The right ventricular size is mildly enlarged. No increase in right ventricular wall thickness. Right ventricular systolic function is mildly reduced. There is normal pulmonary artery systolic pressure. The tricuspid regurgitant velocity  is 2.28 m/s, and with an assumed right atrial pressure of 8 mmHg, the estimated right ventricular systolic pressure is 28.8 mmHg. Left Atrium: Left atrial size was normal in size. Right Atrium: Right atrial size was normal in size. Pericardium: No pericardial effusion is present. There is no evidence of pericardial effusion. Mitral Valve: Trivial mitral valve regurgitation. Tricuspid Valve: The tricuspid valve is normal in structure. Tricuspid valve regurgitation is moderate . No evidence of tricuspid stenosis. Aortic Valve: The aortic valve is tricuspid. Aortic valve regurgitation is mild. Aortic valve sclerosis is present, with no evidence of aortic valve stenosis. Pulmonic Valve: The pulmonic valve was normal in structure. Aorta: The aortic root and ascending aorta are structurally normal, with no evidence of dilitation. Venous: The inferior vena cava is dilated in size with greater than 50% respiratory variability, suggesting right atrial pressure of 8 mmHg. IAS/Shunts: The atrial septum is grossly normal. Additional Comments: There is a small pleural effusion in the left lateral region. Spectral Doppler performed. Color Doppler performed.   LV Volumes (MOD) LV vol d, MOD A2C: 30.8 ml LV vol d, MOD A4C: 33.2 ml LV vol s, MOD A2C: 12.9 ml LV vol s, MOD A4C: 13.4 ml LV SV MOD A2C:     17.9 ml LV SV MOD A4C:     33.2 ml LV SV MOD BP:      19.1 ml IVC IVC diam: 2.60 cm  AORTA Ao Asc diam: 2.90 cm TRICUSPID VALVE TR Peak grad:   20.8 mmHg TR Vmax:        228.00 cm/s Darryle Decent MD Electronically signed by Darryle Decent MD Signature Date/Time: 11/22/2023/10:50:39 AM    Final       Medications:     Current Medications:  aspirin  EC  81 mg Oral Daily   atorvastatin   80 mg Oral Daily   Chlorhexidine  Gluconate Cloth  6 each Topical Daily   clopidogrel   75 mg Oral Q breakfast   levothyroxine   88 mcg Oral Daily   sodium chloride  flush  3 mL Intravenous Q12H   sodium chloride  flush  3 mL Intravenous Q12H  Infusions:  amiodarone 60 mg/hr (11/22/23 1002)   Followed by   amiodarone     norepinephrine  (LEVOPHED ) Adult infusion 2 mcg/min (11/22/23 0710)   piperacillin-tazobactam (ZOSYN)  IV 12.5 mL/hr at 11/22/23 0800   vancomycin     vasopressin  0.03 Units/min (11/22/23 0857)      Patient Profile   Kathryn Cuevas is a 89 y.o. female with recent NSTEMI, HLD and hypothyroid. Admitted post PCI post complication.   Assessment/Plan  CAD> post PCI perforation> pericardial effusion> tamponade> brief cardiac arrest  - LHC this admission with mid Cx 30% stenosed, prox RCA 80% stenosed, 2nd Diag 70% stenosed. Previously treated mid LAD and prox LAD. DES placed in prox RCA - Perforated side branch of mid RCA during procedure resulting in effusion>tamponade.  - Continue Plavix  and ASA - Continue lipitor 80mg  daily - LVEF recovered, was 45-50% post NSTEMI 10/28/23.  - Pericardial drain removed at bedside on 11/22/23 - Repeat TTE with LVEF 70%, no pericardial effusion.  - Restarted levophed  overnight; attempting to wean off now. I suspect she has some degree of vasoplegia from underlying sepsis. Started broad spec abx now on day 2 with improvement in WBC ct from 18.6 to 13.   2. Hypoxic respiratory failure  - Pleural effusion on TTE today - Discussed with CCM; will evaluate for thoracentesis. She has had a persistent O2 requirement.  - IVC dilated to 2.6cm; start IV lasix 80mg  BID.   Length of Stay: 2  Machele Deihl, DO  11/22/2023, 11:08 AM  Advanced Heart  Failure Team Pager [phone] (M-F; 7a - 5p)  Please contact [facility] for night-coverage after hours (4p -7a ) and weekends on [email or web address]   CRITICAL CARE Performed by: Ria Commander   Total critical care time: 35 minutes  Critical care time was exclusive of separately billable procedures and treating other patients.  Critical care was necessary to treat or prevent imminent or life-threatening deterioration.  Critical care was time spent personally by me on the following activities: development of treatment plan with patient and/or surrogate as well as nursing, discussions with consultants, evaluation of patient's response to treatment, examination of patient, obtaining history from patient or surrogate, ordering and performing treatments and interventions, ordering and review of laboratory studies, ordering and review of radiographic studies, pulse oximetry and re-evaluation of patient's condition.

## 2023-11-22 NOTE — Progress Notes (Signed)
 Paged by nurse regarding new atrial fibrillation RVR that occurred around 0700.  Heart rates were going 160-200.  She is mildly symptomatic with some shortness of breath and palpitations.  Blood pressure has been stable but soft throughout the night.  Started IV amiodarone bolus and infusion with very good response.  Heart rates now between 100-120.  Not heparinizing right now given new pericardial window.  Defer to advanced heart failure to initiate.  She remains full code.

## 2023-11-22 NOTE — Procedures (Signed)
 Thoracentesis  Procedure Note  Kathryn Cuevas  [id number]  [birth year]  Date:11/22/23  Time:3:38 PM   Provider Performing:Wes Lezotte   Procedure: Thoracentesis with imaging guidance ([id number])  Indication(s) Pleural Effusion  Consent Risks of the procedure as well as the alternatives and risks of each were explained to the patient and/or caregiver.  Consent for the procedure was obtained and is signed in the bedside chart  Anesthesia Topical only with 1% lidocaine     Time Out Verified patient identification, verified procedure, site/side was marked, verified correct patient position, special equipment/implants available, medications/allergies/relevant history reviewed, required imaging and test results available.   Sterile Technique Maximal sterile technique including full sterile barrier drape, hand hygiene, sterile gown, sterile gloves, mask, hair covering, sterile ultrasound probe cover (if used).  Procedure Description Ultrasound was used to identify appropriate pleural anatomy for placement and overlying skin marked.  Area of drainage cleaned and draped in sterile fashion. Lidocaine  was used to anesthetize the skin and subcutaneous tissue.  250 cc's of blood tinged appearing fluid was drained from the left pleural space. Catheter then removed and bandaid applied to site.   Complications/Tolerance None; patient tolerated the procedure well. Chest X-ray is ordered to confirm no post-procedural complication.   EBL Minimal   Specimen(s) Pleural fluid

## 2023-11-22 NOTE — Progress Notes (Signed)
  Echocardiogram 2D Echocardiogram has been performed.  Kathryn Cuevas 11/22/2023, 8:41 AM

## 2023-11-22 NOTE — Progress Notes (Signed)
 Peripherally Inserted Central Catheter Placement  The IV Nurse has discussed with the patient and/or persons authorized to consent for the patient, the purpose of this procedure and the potential benefits and risks involved with this procedure.  The benefits include less needle sticks, lab draws from the catheter, and the patient may be discharged home with the catheter. Risks include, but not limited to, infection, bleeding, blood clot (thrombus formation), and puncture of an artery; nerve damage and irregular heartbeat and possibility to perform a PICC exchange if needed/ordered by physician.  Alternatives to this procedure were also discussed.  Bard Power PICC patient education guide, fact sheet on infection prevention and patient information card has been provided to patient /or left at bedside.    PICC Placement Documentation  PICC Triple Lumen 11/22/23 Right Brachial 34 cm 0 cm (Active)  Indication for Insertion or Continuance of Line Limited venous access - need for IV therapy >5 days (PICC only) 11/22/23 1718  Exposed Catheter (cm) 0 cm 11/22/23 1718  Site Assessment Clean, Dry, Intact 11/22/23 1718  Lumen #1 Status Flushed;Saline locked;Blood return noted 11/22/23 1718  Lumen #2 Status Flushed;Saline locked;Blood return noted 11/22/23 1718  Lumen #3 Status Flushed;Saline locked;Blood return noted 11/22/23 1718  Dressing Type Transparent;Securing device 11/22/23 1718  Dressing Status Antimicrobial disc/dressing in place;Clean, Dry, Intact 11/22/23 1718  Line Care Connections checked and tightened 11/22/23 1718  Line Adjustment (NICU/IV Team Only) No 11/22/23 1718  Dressing Intervention New dressing 11/22/23 1718  Dressing Change Due 11/29/23 11/22/23 1718       Kathryn Cuevas 11/22/2023, 5:19 PM

## 2023-11-23 ENCOUNTER — Inpatient Hospital Stay (HOSPITAL_COMMUNITY)

## 2023-11-23 DIAGNOSIS — J449 Chronic obstructive pulmonary disease, unspecified: Secondary | ICD-10-CM | POA: Diagnosis not present

## 2023-11-23 DIAGNOSIS — E039 Hypothyroidism, unspecified: Secondary | ICD-10-CM | POA: Diagnosis not present

## 2023-11-23 DIAGNOSIS — I209 Angina pectoris, unspecified: Secondary | ICD-10-CM | POA: Diagnosis not present

## 2023-11-23 DIAGNOSIS — E7800 Pure hypercholesterolemia, unspecified: Secondary | ICD-10-CM | POA: Diagnosis not present

## 2023-11-23 LAB — CBC
HCT: 28 % — ABNORMAL LOW (ref 36.0–46.0)
Hemoglobin: 9.4 g/dL — ABNORMAL LOW (ref 12.0–15.0)
MCH: 33.2 pg (ref 26.0–34.0)
MCHC: 33.6 g/dL (ref 30.0–36.0)
MCV: 98.9 fL (ref 80.0–100.0)
Platelets: 199 10*3/uL (ref 150–400)
RBC: 2.83 MIL/uL — ABNORMAL LOW (ref 3.87–5.11)
RDW: 13.8 % (ref 11.5–15.5)
WBC: 16.3 10*3/uL — ABNORMAL HIGH (ref 4.0–10.5)
nRBC: 0 % (ref 0.0–0.2)

## 2023-11-23 LAB — BASIC METABOLIC PANEL WITH GFR
Anion gap: 11 (ref 5–15)
BUN: 22 mg/dL (ref 8–23)
CO2: 21 mmol/L — ABNORMAL LOW (ref 22–32)
Calcium: 7.7 mg/dL — ABNORMAL LOW (ref 8.9–10.3)
Chloride: 96 mmol/L — ABNORMAL LOW (ref 98–111)
Creatinine, Ser: 0.81 mg/dL (ref 0.44–1.00)
GFR, Estimated: >60 mL/min
Glucose, Bld: 156 mg/dL — ABNORMAL HIGH (ref 70–99)
Potassium: 4.1 mmol/L (ref 3.5–5.1)
Sodium: 128 mmol/L — ABNORMAL LOW (ref 135–145)

## 2023-11-23 LAB — PROCALCITONIN: Procalcitonin: <0.1 ng/mL

## 2023-11-23 LAB — MAGNESIUM: Magnesium: 1.7 mg/dL (ref 1.7–2.4)

## 2023-11-23 MED ORDER — MAGNESIUM SULFATE 2 GM/50ML IV SOLN
2.0000 g | Freq: Once | INTRAVENOUS | Status: AC
  Administered 2023-11-23: 2 g via INTRAVENOUS
  Filled 2023-11-23: qty 50

## 2023-11-23 MED ORDER — ENOXAPARIN SODIUM 30 MG/0.3ML IJ SOSY
30.0000 mg | PREFILLED_SYRINGE | Freq: Every day | INTRAMUSCULAR | Status: DC
  Administered 2023-11-23 – 2023-11-24 (×2): 30 mg via SUBCUTANEOUS
  Filled 2023-11-23 (×2): qty 0.3

## 2023-11-23 NOTE — TOC Progression Note (Signed)
 Transition of Care [facility]) - Progression Note    Patient Details  Name: Kathryn Cuevas MRN: [id number] Date of Birth: [birth year]  [facility] CM/SW Contact  Justina Delcia Czar, RN Phone Number: [phone] 11/23/2023, 5:58 PM  Clinical Narrative:     Spoke to pt and she lives alone. Pt has [facility] prior to admission.  Will need HH orders or possible SNF.  Will need PT/OT recommendations.   Expected Discharge Plan: Home/Self Care Barriers to Discharge: Continued Medical Work up  Expected Discharge Plan and Services       Living arrangements for the past 2 months: Single Family Home                                       Social Determinants of Health (SDOH) Interventions SDOH Screenings   Food Insecurity: Patient Unable To Answer (11/21/2023)  Housing: Patient Unable To Answer (11/21/2023)  Transportation Needs: Patient Unable To Answer (11/21/2023)  Utilities: Patient Unable To Answer (11/21/2023)  Social Connections: Patient Unable To Answer (11/21/2023)  Recent Concern: Social Connections - Socially Isolated (10/28/2023)  Tobacco Use: High Risk (11/09/2023)    Readmission Risk Interventions    10/29/2023    2:00 PM  Readmission Risk Prevention Plan  Post Dischage Appt Complete  Medication Screening Complete  Transportation Screening Complete

## 2023-11-23 NOTE — Progress Notes (Addendum)
 Advanced Heart Failure Team Consult Note   Primary Physician: Okey Carlin Redbird, MD Cardiologist:  Alm Clay, MD  Reason for Consultation: Cardiac tamponade  Interval hx  - pericardial drain removed 6/28  - This AM on levophed  5 & vasopressin  with MAP ~65.  - s/p thoracentesis 6/29.  - Afib with RVR briefly morning of 6/29; now on amio gtt with rates 50s-60s in NSR.   Struggling to cut up her breakfast this morning (assisted). Denies CP/SOB. Has not gotten OOB yet.   Objective:    Vital Signs:   Temp:  [87.5 F (30.8 C)-98 F (36.7 C)] 96.6 F (35.9 C) (06/30 0331) Pulse Rate:  [50-128] 51 (06/30 0645) Resp:  [10-26] 17 (06/30 0645) BP: (73-143)/(34-91) 106/47 (06/30 0645) SpO2:  [79 %-100 %] 97 % (06/30 0645) Arterial Line BP: (61-157)/(36-73) 125/46 (06/30 0645) Last BM Date : 11/21/23  Weight change: Filed Weights   11/19/23 0733  Weight: 49.9 kg   Intake/Output:   Intake/Output Summary (Last 24 hours) at 11/23/2023 0722 Last data filed at 11/23/2023 0600 Gross per 24 hour  Intake 1412.47 ml  Output 1750 ml  Net -337.53 ml    Physical Exam   General:  elderly appearing.  No respiratory difficulty Neck: supple. JVD flat.  Cor: PMI nondisplaced. Regular rate & rhythm. No rubs, gallops or murmurs. Lungs: clear Extremities: no cyanosis, clubbing, rash, edema  Neuro: alert & oriented x 3. Affect pleasant. Telemetry   SB/NSR 50s-60s (Personally reviewed)    EKG    No new EKG to review  Labs   Basic Metabolic Panel: Recent Labs  Lab 11/20/23 0633 11/21/23 0440 11/22/23 0500 11/23/23 0430  NA 137 136 130* 128*  K 4.3 4.3 3.7 4.1  CL 107 107 104 96*  CO2 21* 23 23 21*  GLUCOSE 195* 127* 114* 156*  BUN 21 28* 26* 22  CREATININE 0.80 0.87 0.80 0.81  CALCIUM  8.2* 8.3* 7.9* 7.7*  MG  --  1.9 2.1 1.7   Liver Function Tests: Recent Labs  Lab 11/22/23 1538  PROT 4.9*   No results for input(s): LIPASE, AMYLASE in the last 168  hours. No results for input(s): AMMONIA in the last 168 hours.  CBC: Recent Labs  Lab 11/20/23 0633 11/21/23 0440 11/22/23 0500 11/23/23 0430  WBC 13.7* 18.6* 13.0* 16.3*  HGB 10.6* 9.6* 8.8* 9.4*  HCT 32.0* 28.7* 26.3* 28.0*  MCV 103.6* 102.1* 101.5* 98.9  PLT 218 188 169 199   Imaging   DG CHEST PORT 1 VIEW Result Date: 11/22/2023 CLINICAL DATA:  PICC placement. EXAM: PORTABLE CHEST 1 VIEW COMPARISON:  Radiograph yesterday FINDINGS: Tip of the right upper extremity PICC overlies the mid SVC. Question of a second catheter projecting over the right supraclavicular region, tip in the midline. No pneumothorax. Shifting opacities at the lung bases, worsening on the right but improving on the left. Small left pleural effusion persists. Background hyperinflation and bronchial thickening. Stable heart size and mediastinal contours. IMPRESSION: 1. Tip of the right upper extremity PICC overlies the mid SVC. 2. Question of a second catheter projecting over the right supraclavicular region, tip in the midline. Recommend correlation with physical exam. 3. Shifting opacities at the lung bases, worsening on the right but improving on the left. Small left pleural effusion persists. Electronically Signed   By: Andrea Gasman M.D.   On: 11/22/2023 17:52   US  EKG SITE RITE Result Date: 11/22/2023 If Site Rite image not attached, placement could not  be confirmed due to current cardiac rhythm.  ECHOCARDIOGRAM LIMITED Result Date: 11/22/2023    ECHOCARDIOGRAM LIMITED REPORT   Patient Name:   Kathryn Cuevas Date of Exam: 11/22/2023 Medical Rec #:  [id number]   Height:       63.0 in Accession #:    [id number]  Weight:       110.0 lb Date of Birth:  08/08/1931   BSA:          1.500 m Patient Age:    89 years    BP:           104/49 mmHg Patient Gender: F           HR:           140 bpm. Exam Location:  Inpatient Procedure: Limited Echo, Cardiac Doppler and Color Doppler (Both Spectral and            Color Flow  Doppler were utilized during procedure). Indications:    I31.3 Pericardial effusion (noninflammatory); R94.31 Abnormal                 EKG  History:        Patient has prior history of Echocardiogram examinations, most                 recent 11/20/2023. Previous Myocardial Infarction and CAD, COPD;                 Risk Factors:Current Smoker. Pericardial effusion.  Sonographer:    Ellouise Mose RDCS Referring Phys: [id number] ADITYA SABHARWAL  Sonographer Comments: High Fowler's position IMPRESSIONS  1. No pericardial effusion is present.  2. Left ventricular ejection fraction, by estimation, is 70 to 75%. The left ventricle has hyperdynamic function. The left ventricle has no regional wall motion abnormalities. Left ventricular diastolic parameters are indeterminate.  3. Right ventricular systolic function is mildly reduced. The right ventricular size is mildly enlarged. There is normal pulmonary artery systolic pressure. The estimated right ventricular systolic pressure is 28.8 mmHg.  4. Trivial mitral valve regurgitation.  5. Tricuspid valve regurgitation is moderate.  6. The aortic valve is tricuspid. Aortic valve regurgitation is mild. Aortic valve sclerosis is present, with no evidence of aortic valve stenosis.  7. The inferior vena cava is dilated in size with >50% respiratory variability, suggesting right atrial pressure of 8 mmHg. Comparison(s): No significant change from prior study. FINDINGS  Left Ventricle: Left ventricular ejection fraction, by estimation, is 70 to 75%. The left ventricle has hyperdynamic function. The left ventricle has no regional wall motion abnormalities. Left ventricular diastolic parameters are indeterminate. Right Ventricle: The right ventricular size is mildly enlarged. No increase in right ventricular wall thickness. Right ventricular systolic function is mildly reduced. There is normal pulmonary artery systolic pressure. The tricuspid regurgitant velocity  is 2.28 m/s, and with an  assumed right atrial pressure of 8 mmHg, the estimated right ventricular systolic pressure is 28.8 mmHg. Left Atrium: Left atrial size was normal in size. Right Atrium: Right atrial size was normal in size. Pericardium: No pericardial effusion is present. There is no evidence of pericardial effusion. Mitral Valve: Trivial mitral valve regurgitation. Tricuspid Valve: The tricuspid valve is normal in structure. Tricuspid valve regurgitation is moderate . No evidence of tricuspid stenosis. Aortic Valve: The aortic valve is tricuspid. Aortic valve regurgitation is mild. Aortic valve sclerosis is present, with no evidence of aortic valve stenosis. Pulmonic Valve: The pulmonic valve was normal in structure. Aorta: The aortic root and  ascending aorta are structurally normal, with no evidence of dilitation. Venous: The inferior vena cava is dilated in size with greater than 50% respiratory variability, suggesting right atrial pressure of 8 mmHg. IAS/Shunts: The atrial septum is grossly normal. Additional Comments: There is a small pleural effusion in the left lateral region. Spectral Doppler performed. Color Doppler performed.   LV Volumes (MOD) LV vol d, MOD A2C: 30.8 ml LV vol d, MOD A4C: 33.2 ml LV vol s, MOD A2C: 12.9 ml LV vol s, MOD A4C: 13.4 ml LV SV MOD A2C:     17.9 ml LV SV MOD A4C:     33.2 ml LV SV MOD BP:      19.1 ml IVC IVC diam: 2.60 cm  AORTA Ao Asc diam: 2.90 cm TRICUSPID VALVE TR Peak grad:   20.8 mmHg TR Vmax:        228.00 cm/s Darryle Decent MD Electronically signed by Darryle Decent MD Signature Date/Time: 11/22/2023/10:50:39 AM    Final     Medications:   Current Medications:  aspirin  EC  81 mg Oral Daily   atorvastatin   80 mg Oral Daily   Chlorhexidine  Gluconate Cloth  6 each Topical Daily   clopidogrel   75 mg Oral Q breakfast   furosemide  80 mg Intravenous BID   levothyroxine   88 mcg Oral Daily   sodium chloride  flush  10-40 mL Intracatheter Q12H   sodium chloride  flush  3 mL Intravenous  Q12H   sodium chloride  flush  3 mL Intravenous Q12H   Infusions:  amiodarone 30 mg/hr (11/23/23 0600)   norepinephrine  (LEVOPHED ) Adult infusion 5 mcg/min (11/23/23 0600)   piperacillin-tazobactam (ZOSYN)  IV 12.5 mL/hr at 11/23/23 0600   vancomycin Stopped (11/22/23 1300)   vasopressin  0.03 Units/min (11/23/23 0600)    Patient Profile   Kathryn Cuevas is a 89 y.o. female with recent NSTEMI, HLD and hypothyroid. Admitted post PCI post complication.   Assessment/Plan  CAD> post PCI perforation> pericardial effusion> tamponade> brief cardiac arrest  - LHC this admission with mid Cx 30% stenosed, prox RCA 80% stenosed, 2nd Diag 70% stenosed. Previously treated mid LAD and prox LAD. DES placed in prox RCA - Perforated side branch of mid RCA during procedure resulting in effusion>tamponade.  - Continue Plavix  and ASA - Continue lipitor 80mg  daily - LVEF recovered, was 45-50% post NSTEMI 10/28/23.  - Pericardial drain removed at bedside on 11/22/23 - Repeat TTE 6/29 with LVEF 70%, no pericardial effusion.  - Continue levophed ; attempting to wean off now. Suspect she has some degree of vasoplegia from underlying sepsis. Now on broad spec abx now on day 2. WBC ct 18.6>13>16.3.  - Continue vaso 0.03  2. Hypoxic respiratory failure  - Pleural effusion on TTE 6/29, now s/p thoracentesis. - daily - She has had a persistent O2 requirement.  - IVC dilated to 2.6cm. Diuresed with 80 IV BID. -1.47L UOP. Will give one more dose of lasix this morning.   PT/OT ordered today. OOB. Ambulate.   Length of Stay: 3  Beckey LITTIE Coe, NP  11/23/2023, 7:22 AM  Advanced Heart Failure Team Pager [phone] (M-F; 7a - 5p)  Please contact [facility] for night-coverage after hours (4p -7a ) and weekends on [email or web address]   CRITICAL CARE Performed by: Beckey LITTIE Coe  Total critical care time: 13 minutes  Critical care time was exclusive of separately billable procedures and treating other patients.  Critical  care was necessary to treat or prevent imminent or life-threatening  deterioration.  Critical care was time spent personally by me on the following activities: development of treatment plan with patient and/or surrogate as well as nursing, discussions with consultants, evaluation of patient's response to treatment, examination of patient, obtaining history from patient or surrogate, ordering and performing treatments and interventions, ordering and review of laboratory studies, ordering and review of radiographic studies, pulse oximetry and re-evaluation of patient's condition.   Patient seen with NP, I formulated the plan and agree with the above note.   I/Os near even with IV Lasix. Still on NE 5 and vasopressin  with wide pulse pressure.  No chest pain or dyspnea.    She remains on 5L [place].    General: NAD Neck: No JVD, no thyromegaly or thyroid  nodule.  Lungs: Clear to auscultation bilaterally with normal respiratory effort. CV: Nondisplaced PMI.  Heart regular S1/S2, no S3/S4, no murmur.  No peripheral edema.   Abdomen: Soft, nontender, no hepatosplenomegaly, no distention.  Skin: Intact without lesions or rashes.  Neurologic: Alert and oriented x 3.  Psych: Normal affect. Extremities: No clubbing or cyanosis.  HEENT: Normal.   Patient does not look volume overloaded on exam though she still has a significant oxygen requirement.  Echo post-PCI showed EF 70-75%, mild RV dysfunction.  - CXR PA/lateral - Will give 1 more IV Lasix dose with oxygen requirement, set up CVP.   Work on weaning off pressors today.  With wide pulse pressure, can wean for SBP > 95.  She is afebrile, WBCs elevated.  Covering for possible septic/distributive shock component with vancomycin/Zosyn.   She remains in NSR on amiodarone.  She had only about 2 hrs of AF in setting of pericardial irritation.  She is on DAPT.   - Continue IV amiodarone while on pressors, can continue po amiodarone x 1 month if no further AF.   - If no further AF, would hold off on anticoagulation.  If she has further AF, will need to anticoagulate (stop ASA in that event).   Needs PT/OT.   CRITICAL CARE Performed by: Ezra Shuck  Total critical care time: 40 minutes  Critical care time was exclusive of separately billable procedures and treating other patients.  Critical care was necessary to treat or prevent imminent or life-threatening deterioration.  Critical care was time spent personally by me on the following activities: development of treatment plan with patient and/or surrogate as well as nursing, discussions with consultants, evaluation of patient's response to treatment, examination of patient, obtaining history from patient or surrogate, ordering and performing treatments and interventions, ordering and review of laboratory studies, ordering and review of radiographic studies, pulse oximetry and re-evaluation of patient's condition.  Ezra Shuck 11/23/2023 9:29 AM

## 2023-11-24 DIAGNOSIS — I209 Angina pectoris, unspecified: Secondary | ICD-10-CM | POA: Diagnosis not present

## 2023-11-24 LAB — BASIC METABOLIC PANEL WITH GFR
Anion gap: 6 (ref 5–15)
Anion gap: 9 (ref 5–15)
BUN: 18 mg/dL (ref 8–23)
BUN: 17 mg/dL (ref 8–23)
CO2: 30 mmol/L (ref 22–32)
CO2: 29 mmol/L (ref 22–32)
Calcium: 7.7 mg/dL — ABNORMAL LOW (ref 8.9–10.3)
Calcium: 8 mg/dL — ABNORMAL LOW (ref 8.9–10.3)
Chloride: 97 mmol/L — ABNORMAL LOW (ref 98–111)
Chloride: 92 mmol/L — ABNORMAL LOW (ref 98–111)
Creatinine, Ser: 0.85 mg/dL (ref 0.44–1.00)
Creatinine, Ser: 0.81 mg/dL (ref 0.44–1.00)
GFR, Estimated: >60 mL/min (ref >60)
GFR, Estimated: >60 mL/min (ref >60)
Glucose, Bld: 99 mg/dL (ref 70–99)
Glucose, Bld: 130 mg/dL — ABNORMAL HIGH (ref 70–99)
Potassium: 2.8 mmol/L — ABNORMAL LOW (ref 3.5–5.1)
Potassium: 3.9 mmol/L (ref 3.5–5.1)
Sodium: 133 mmol/L — ABNORMAL LOW (ref 135–145)
Sodium: 130 mmol/L — ABNORMAL LOW (ref 135–145)

## 2023-11-24 LAB — GLUCOSE, CAPILLARY: Glucose-Capillary: 102 mg/dL — ABNORMAL HIGH (ref 70–99)

## 2023-11-24 LAB — MAGNESIUM: Magnesium: 2.2 mg/dL (ref 1.7–2.4)

## 2023-11-24 LAB — CBC
HCT: 26.2 % — ABNORMAL LOW (ref 36.0–46.0)
Hemoglobin: 8.9 g/dL — ABNORMAL LOW (ref 12.0–15.0)
MCH: 34.2 pg — ABNORMAL HIGH (ref 26.0–34.0)
MCHC: 34 g/dL (ref 30.0–36.0)
MCV: 100.8 fL — ABNORMAL HIGH (ref 80.0–100.0)
Platelets: 203 10*3/uL (ref 150–400)
RBC: 2.6 MIL/uL — ABNORMAL LOW (ref 3.87–5.11)
RDW: 14 % (ref 11.5–15.5)
WBC: 12 10*3/uL — ABNORMAL HIGH (ref 4.0–10.5)
nRBC: 0 % (ref 0.0–0.2)

## 2023-11-24 LAB — PATHOLOGIST SMEAR REVIEW: Path Review: NEGATIVE

## 2023-11-24 MED ORDER — POTASSIUM CHLORIDE 10 MEQ/50ML IV SOLN
10.0000 meq | INTRAVENOUS | Status: AC
  Administered 2023-11-24 (×6): 10 meq via INTRAVENOUS
  Filled 2023-11-24: qty 50

## 2023-11-24 MED ORDER — AMIODARONE HCL 200 MG PO TABS
200.0000 mg | ORAL_TABLET | Freq: Two times a day (BID) | ORAL | Status: DC
  Administered 2023-11-24 – 2023-11-29 (×12): 200 mg via ORAL
  Filled 2023-11-24 (×13): qty 1

## 2023-11-24 MED ORDER — FUROSEMIDE 10 MG/ML IJ SOLN
80.0000 mg | Freq: Once | INTRAMUSCULAR | Status: AC
  Administered 2023-11-24: 80 mg via INTRAVENOUS
  Filled 2023-11-24: qty 8

## 2023-11-24 MED ORDER — POTASSIUM CHLORIDE CRYS ER 20 MEQ PO TBCR: 40.0000 meq | EXTENDED_RELEASE_TABLET | Freq: Once | ORAL | Status: DC

## 2023-11-24 MED ORDER — POTASSIUM CHLORIDE 20 MEQ PO PACK
40.0000 meq | PACK | Freq: Once | ORAL | Status: AC
  Administered 2023-11-24: 40 meq via ORAL
  Filled 2023-11-24: qty 2

## 2023-11-24 NOTE — Progress Notes (Signed)
 Pt was educated on stent card, stent location, Antiplatelet and ASA use, wt restrictions, no baths/daily wash-ups, s/s of infection, ex guidelines, s/s to stop exercising, NTG use and calling 911, heart healthy diet, risk factors and CRPII. Pt received materials on exercise, diet, and CRPII. Will refer to [facility].   Pt states she is not interested in CR because she does not drive.   Garen FORBES Candy MS, ACSM-CEP 11/24/2023 1:34 PM

## 2023-11-24 NOTE — Evaluation (Signed)
 Physical Therapy Evaluation Patient Details Name: Kathryn Cuevas MRN: [id number] DOB: [birth year] Today's Date: 11/24/2023  History of Present Illness  89 yo female with recent NSTEMI  admitted 6/26 post PCI complication s/p thoracentesis 6/29 Afib with RVR. 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  Clinical Impression  Pt admitted with above diagnosis. Previously independent, living alone, using a walker for mobility, neighbor assists in/out of house and drives her places 2-3x per week. Denies any recent falls. Today she required min assist +2 for safety/equipment management for bed mobility sit to stand transfer, and short distance gait in room with RW for support. Mod assist to control descent into chair with cues on safe use of AD and transitions between surface to reduce fall risk. SpO2 94% and greater on 5L HFNC throughout session. Mild dizziness upon rising with BP 107/59 HR 66, symptoms gradually resolved with time. Reviewed LE exercises, encouraged OOB frequently with staff. Patient will benefit from continued inpatient follow up therapy, <3 hours/day. Will progress acutely as tolerated. Pt currently with functional limitations due to the deficits listed below (see PT Problem List). Pt will benefit from acute skilled PT to increase their independence and safety with mobility to allow discharge.           If plan is discharge home, recommend the following: A little help with walking and/or transfers;A little help with bathing/dressing/bathroom;Assistance with cooking/housework;Assist for transportation;Help with stairs or ramp for entrance   Can travel by private vehicle   Yes    Equipment Recommendations None recommended by PT  Recommendations for Other Services       Functional Status Assessment Patient has had a recent decline in their  functional status and demonstrates the ability to make significant improvements in function in a reasonable and predictable amount of time.     Precautions / Restrictions Precautions Precautions: Fall Recall of Precautions/Restrictions: Impaired Precaution/Restrictions Comments: watch O2 Restrictions Weight Bearing Restrictions Per Provider Order: No      Mobility  Bed Mobility Overal bed mobility: Needs Assistance Bed Mobility: Supine to Sit     Supine to sit: Min assist, +2 for safety/equipment, HOB elevated     General bed mobility comments: min assist using bed pad to rotatate and scoot to EOB. +2 for safety. VC throughout. A little difficulty with trunk. Able to pull through therapist's hand    Transfers Overall transfer level: Needs assistance Equipment used: Rolling walker (2 wheels) Transfers: Sit to/from Stand, Bed to chair/wheelchair/BSC Sit to Stand: Min assist, +2 safety/equipment, Mod assist   Step pivot transfers: Min assist       General transfer comment: Light assist for balance upon rising, slight posterior lean +2 available for safety.  Mod assist required for uncontrolled descent into chair. Cues for set up and hand placement. No increase in dizziness upon rising. Able to step pivot with min assist for RW control, some difficulty sequencing/direction following.    Ambulation/Gait Ambulation/Gait assistance: Contact guard assist, +2 safety/equipment Gait Distance (Feet): 6 Feet Assistive device: Rolling walker (2 wheels) Gait Pattern/deviations: Step-through pattern, Decreased stride length, Shuffle, Trunk flexed Gait velocity: decr Gait velocity interpretation: <1.31 ft/sec, indicative of household ambulator   General Gait Details: Moderate dyspnea with minimal exertion, able to walk forward and turn with close CGA +2 for safety and equipment management. Cues to align RW prior to sitting and while turning due to pushing  out of the way. No overt buckling  but easily fatigued.  Stairs            Wheelchair Mobility     Tilt Bed    Modified Rankin (Stroke Patients Only)       Balance Overall balance assessment: Needs assistance Sitting-balance support: Feet supported, No upper extremity supported Sitting balance-Leahy Scale: Fair Sitting balance - Comments: supervision for safety   Standing balance support: Reliant on assistive device for balance, Single extremity supported Standing balance-Leahy Scale: Poor                               Pertinent Vitals/Pain Pain Assessment Pain Assessment: No/denies pain    Home Living Family/patient expects to be discharged to:: Private residence Living Arrangements: Alone Available Help at Discharge: Neighbor Type of Home: House Home Access: Stairs to enter Entrance Stairs-Rails: None Entrance Stairs-Number of Steps: 2   Home Layout: Two level;Able to live on main level with bedroom/bathroom Home Equipment: Rolling Walker (2 wheels);Cane - single point;Grab bars - tub/shower;Shower seat - built in Additional Comments: neighbor takes her out into the community 2-3 times per week. does not drive relies on the neighbor    Prior Function Prior Level of Function : Independent/Modified Independent             Mobility Comments: uses RW for ambulation, denies falls ADLs Comments: seat for showering, neighbor assists with iADL and community mobility.  Son currently hospitalized.     Extremity/Trunk Assessment   Upper Extremity Assessment Upper Extremity Assessment: Defer to OT evaluation    Lower Extremity Assessment Lower Extremity Assessment: Generalized weakness    Cervical / Trunk Assessment Cervical / Trunk Assessment: Kyphotic  Communication   Communication Communication: Impaired Factors Affecting Communication: Hearing impaired    Cognition Arousal: Alert Behavior During Therapy: WFL for tasks assessed/performed   PT - Cognitive impairments:  No family/caregiver present to determine baseline, Attention, Sequencing, Problem solving, Safety/Judgement                         Following commands: Intact       Cueing Cueing Techniques: Verbal cues     General Comments General comments (skin integrity, edema, etc.): Dizzy upon rising to EOB BP107/59 HR 60. SpO2 98% on 5L HFNC. Moderate dyspnea.    Exercises General Exercises - Lower Extremity Ankle Circles/Pumps: AROM, Both, 10 reps, Supine Quad Sets: Strengthening, Both, 10 reps, Seated Gluteal Sets: Strengthening, Both, 10 reps, Seated   Assessment/Plan    PT Assessment Patient needs continued PT services  PT Problem List Decreased strength;Decreased knowledge of use of DME;Decreased mobility;Decreased balance;Decreased activity tolerance;Decreased range of motion;Decreased safety awareness;Decreased knowledge of precautions;Cardiopulmonary status limiting activity       PT Treatment Interventions DME instruction;Gait training;Stair training;Functional mobility training;Therapeutic activities;Therapeutic exercise;Balance training;Patient/family education;Wheelchair mobility training;Neuromuscular re-education    PT Goals (Current goals can be found in the Care Plan section)  Acute Rehab PT Goals Patient Stated Goal: Get well return home PT Goal Formulation: With patient Time For Goal Achievement: 12/08/23 Potential to Achieve Goals: Good    Frequency Min 2X/week     Co-evaluation PT/OT/SLP Co-Evaluation/Treatment: Yes Reason for Co-Treatment: For patient/therapist safety;To address functional/ADL transfers PT goals addressed during session: Mobility/safety with mobility;Balance;Proper use of DME;Strengthening/ROM         AM-PAC PT 6 Clicks Mobility  Outcome Measure Help needed turning from your back  to your side while in a flat bed without using bedrails?: A Little Help needed moving from lying on your back to sitting on the side of a flat bed  without using bedrails?: A Little Help needed moving to and from a bed to a chair (including a wheelchair)?: A Little Help needed standing up from a chair using your arms (e.g., wheelchair or bedside chair)?: A Little Help needed to walk in hospital room?: A Lot Help needed climbing 3-5 steps with a railing? : A Lot 6 Click Score: 16    End of Session Equipment Utilized During Treatment: Gait belt Activity Tolerance: Patient tolerated treatment well Patient left: in chair;with family/visitor present Nurse Communication: Mobility status PT Visit Diagnosis: Muscle weakness (generalized) (M62.81);Difficulty in walking, not elsewhere classified (R26.2);Other abnormalities of gait and mobility (R26.89);Unsteadiness on feet (R26.81)    Time: 8854-8787 PT Time Calculation (min) (ACUTE ONLY): 27 min   Charges:   PT Evaluation $PT Eval Moderate Complexity: 1 Mod   PT General Charges $$ ACUTE PT VISIT: 1 Visit         Leontine Roads, PT, DPT [facility]  Rehabilitation Services Physical Therapist Office: [phone] Website: [email or web address]   Leontine GORMAN Roads 11/24/2023, 1:40 PM

## 2023-11-24 NOTE — Evaluation (Signed)
 Occupational Therapy Evaluation Patient Details Name: Kathryn Cuevas MRN: [id number] DOB: 04/21/1932 Today's Date: 11/24/2023   History of Present Illness   89 yo female with recent NSTEMI  admitted 6/26 post PCI complication s/p thoracentesis 6/29 Afib with RVR. 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     Clinical Impressions Patient is s/p thoracentesis resulting in functional limitations due to the deficits listed below (see OT problem list). Pt living indep with (A) from neighbor. The neighbor reports she does as much as pt will let her and has required her to put her groceries away more recently. Pt uses a rw with a basket to get laundry from room to room. Pt has a Advertising copywriter and a IT sales professional from [facility].  Patient will benefit from skilled OT acutely to increase independence and safety with ADLS to allow discharge skilled inpatient follow up therapy, <3 hours/day.  Pt is high fall risk for falls and decreased activity tolerance at this time. Neighbor agreeable to help but concerned that short term rehab in the community might help with strengthening prior to home. .      If plan is discharge home, recommend the following:   A little help with walking and/or transfers;A little help with bathing/dressing/bathroom     Functional Status Assessment   Patient has had a recent decline in their functional status and demonstrates the ability to make significant improvements in function in a reasonable and predictable amount of time.     Equipment Recommendations   BSC/3in1     Recommendations for Other Services         Precautions/Restrictions   Precautions Precautions: Fall Recall of Precautions/Restrictions: Impaired Precaution/Restrictions Comments: watch O2 Restrictions Weight Bearing Restrictions Per Provider Order: No      Mobility Bed Mobility Overal bed mobility: Needs Assistance Bed Mobility: Supine to Sit     Supine to sit: Min assist, +2 for safety/equipment, HOB elevated     General bed mobility comments: min assist using bed pad to rotatate and scoot to EOB. +2 for safety. VC throughout. A little difficulty with trunk. Able to pull through therapist's hand    Transfers Overall transfer level: Needs assistance Equipment used: Rolling walker (2 wheels) Transfers: Sit to/from Stand, Bed to chair/wheelchair/BSC Sit to Stand: Min assist, +2 safety/equipment, Mod assist     Step pivot transfers: Min assist     General transfer comment: Light assist for balance upon rising, slight posterior lean +2 available for safety.  Mod assist required for uncontrolled descent into chair. Cues for set up and hand placement. No increase in dizziness upon rising. Able to step pivot with min assist for RW control, some difficulty sequencing/direction following.      Balance Overall balance assessment: Needs assistance Sitting-balance support: Feet supported, No upper extremity supported Sitting balance-Leahy Scale: Fair Sitting balance - Comments: supervision for safety   Standing balance support: Reliant on assistive device for balance, Single extremity supported Standing balance-Leahy Scale: Poor                             ADL either performed or assessed with clinical judgement   ADL Overall ADL's : Needs assistance/impaired Eating/Feeding: Set up   Grooming: Set up   Upper Body Bathing: Moderate assistance   Lower Body Bathing: Maximal assistance  Toilet Transfer: Minimal assistance;+2 for physical assistance;+2 for safety/equipment             General ADL Comments: pt reports urgency to void bladder and inability to control it completely. pt lives alone and agrees that night time transfer to the bathroom is fall risk     Vision Baseline Vision/History: 1 Wears  glasses Patient Visual Report: No change from baseline Vision Assessment?: No apparent visual deficits     Perception         Praxis         Pertinent Vitals/Pain       Extremity/Trunk Assessment Upper Extremity Assessment Upper Extremity Assessment: Generalized weakness   Lower Extremity Assessment Lower Extremity Assessment: Generalized weakness   Cervical / Trunk Assessment Cervical / Trunk Assessment: Kyphotic   Communication Communication Communication: Impaired Factors Affecting Communication: Hearing impaired   Cognition Arousal: Alert Behavior During Therapy: WFL for tasks assessed/performed Cognition: No apparent impairments                               Following commands: Intact       Cueing  General Comments   Cueing Techniques: Verbal cues  dizzy sitting, BP 107/59 HR 60 5L HFNC DOE 3 out 4   Exercises Exercises: General Lower Extremity General Exercises - Lower Extremity Ankle Circles/Pumps: AROM, Both, 10 reps, Supine Quad Sets: Strengthening, Both, 10 reps, Seated Gluteal Sets: Strengthening, Both, 10 reps, Seated   Shoulder Instructions      Home Living Family/patient expects to be discharged to:: Private residence Living Arrangements: Alone Available Help at Discharge: Neighbor Type of Home: House Home Access: Stairs to enter [employer] of Steps: 2 Entrance Stairs-Rails: None Home Layout: Two level;Able to live on main level with bedroom/bathroom   Alternate Level Stairs-Rails: None Bathroom Shower/Tub: Producer, television/film/video: Handicapped height Bathroom Accessibility: Yes   Home Equipment: Agricultural consultant (2 wheels);Cane - single point;Grab bars - tub/shower;Shower seat - built in   Additional Comments: neighbor takes her out into the community 2-3 times per week. does not drive relies on the neighbor      Prior Functioning/Environment Prior Level of Function : Independent/Modified  Independent             Mobility Comments: uses RW for ambulation, denies falls ADLs Comments: seat for showering, neighbor assists with iADL and community mobility.  Son currently hospitalized.    OT Problem List: Impaired balance (sitting and/or standing);Decreased safety awareness;Decreased knowledge of use of DME or AE;Decreased knowledge of precautions;Cardiopulmonary status limiting activity   OT Treatment/Interventions: Self-care/ADL training;Therapeutic exercise;Energy conservation;DME and/or AE instruction;Modalities;Manual therapy;Therapeutic activities;Patient/family education;Balance training      OT Goals(Current goals can be found in the care plan section)   Acute Rehab OT Goals Patient Stated Goal: to get better - this was suppose to be 1 day surgery and i have been here for days OT Goal Formulation: With patient Time For Goal Achievement: 12/08/23 Potential to Achieve Goals: Good   OT Frequency:  Min 2X/week    Co-evaluation PT/OT/SLP Co-Evaluation/Treatment: Yes Reason for Co-Treatment: For patient/therapist safety;To address functional/ADL transfers PT goals addressed during session: Mobility/safety with mobility;Balance;Proper use of DME;Strengthening/ROM OT goals addressed during session: ADL's and self-care      AM-PAC OT 6 Clicks Daily Activity     Outcome Measure Help from another person eating meals?: A Little Help from another person taking care of personal grooming?: None  Help from another person toileting, which includes using toliet, bedpan, or urinal?: A Lot Help from another person bathing (including washing, rinsing, drying)?: A Lot Help from another person to put on and taking off regular upper body clothing?: A Little Help from another person to put on and taking off regular lower body clothing?: Total 6 Click Score: 15   End of Session Equipment Utilized During Treatment: Gait belt;Rolling walker (2 wheels);Oxygen Nurse Communication:  Mobility status;Precautions  Activity Tolerance: Patient tolerated treatment well Patient left: in chair;with call bell/phone within reach;with chair alarm set;with family/visitor present  OT Visit Diagnosis: Unsteadiness on feet (R26.81);Muscle weakness (generalized) (M62.81)                Time: 8854-8787 OT Time Calculation (min): 27 min Charges:  OT General Charges $OT Visit: 1 Visit OT Evaluation $OT Eval Moderate Complexity: 1 Mod   Brynn, OTR/L  Acute Rehabilitation Services Office: [phone] .   Ely Molt 11/24/2023, 2:23 PM

## 2023-11-24 NOTE — Progress Notes (Addendum)
 Advanced Heart Failure Team Consult Note   Primary Physician: Okey Carlin Redbird, MD Cardiologist:  Alm Clay, MD  Reason for Consultation: Cardiac tamponade  Interval hx  - pericardial drain removed 6/28  - s/p thoracentesis 6/29.   - Now off vaso and levo at 2.  - Afib with RVR briefly morning of 6/29; now on amio gtt with rates 50s-60s, maintaining NSR.   CVP 8/9, K 2.8. Diuresed yesterday with IV lasix. -2.9L UOP. Renal function stable.   Feels ok this morning. Denies CP/SOB.   Objective:    Vital Signs:   Temp:  [96.5 F (35.8 C)-97.6 F (36.4 C)] 97.1 F (36.2 C) (07/01 0400) Pulse Rate:  [51-64] 52 (07/01 0700) Resp:  [11-26] 14 (07/01 0700) BP: (83-122)/(39-82) 101/49 (07/01 0700) SpO2:  [88 %-100 %] 99 % (07/01 0700) Arterial Line BP: (65-130)/(39-65) 94/65 (06/30 1315) Weight:  [49.3 kg] 49.3 kg (07/01 0500) Last BM Date : 11/21/23  Weight change: Filed Weights   11/19/23 0733 11/24/23 0500  Weight: 49.9 kg 49.3 kg   Intake/Output:   Intake/Output Summary (Last 24 hours) at 11/24/2023 0708 Last data filed at 11/24/2023 0618 Gross per 24 hour  Intake 1702.25 ml  Output 2900 ml  Net -1197.75 ml    Physical Exam  General:  elderly appearing.  No respiratory difficulty Neck: supple. JVD ~8 cm.  Cor: PMI nondisplaced. Regular rate & rhythm. No rubs, gallops or murmurs. Lungs: clear, coarse on expiration Extremities: no cyanosis, clubbing, rash, edema. PICC RUE Neuro: alert & oriented x 3. Moves all 4 extremities w/o difficulty. Affect pleasant.  Telemetry   SB/NSR 50s-60s, intt PVCs(Personally reviewed)    EKG    No new EKG to review  Labs   Basic Metabolic Panel: Recent Labs  Lab 11/20/23 0633 11/21/23 0440 11/22/23 0500 11/23/23 0430 11/24/23 0408  NA 137 136 130* 128* 133*  K 4.3 4.3 3.7 4.1 2.8*  CL 107 107 104 96* 97*  CO2 21* 23 23 21* 30  GLUCOSE 195* 127* 114* 156* 99  BUN 21 28* 26* 22 18  CREATININE 0.80 0.87 0.80 0.81  0.85  CALCIUM  8.2* 8.3* 7.9* 7.7* 7.7*  MG  --  1.9 2.1 1.7 2.2   Liver Function Tests: Recent Labs  Lab 11/22/23 1538  PROT 4.9*   No results for input(s): LIPASE, AMYLASE in the last 168 hours. No results for input(s): AMMONIA in the last 168 hours.  CBC: Recent Labs  Lab 11/20/23 0633 11/21/23 0440 11/22/23 0500 11/23/23 0430 11/24/23 0408  WBC 13.7* 18.6* 13.0* 16.3* 12.0*  HGB 10.6* 9.6* 8.8* 9.4* 8.9*  HCT 32.0* 28.7* 26.3* 28.0* 26.2*  MCV 103.6* 102.1* 101.5* 98.9 100.8*  PLT 218 188 169 199 203   Imaging   DG CHEST PORT 1 VIEW Result Date: 11/23/2023 CLINICAL DATA:  Hypoxia EXAM: PORTABLE CHEST 1 VIEW COMPARISON:  X-ray 11/22/2023 and older FINDINGS: Normal cardiopericardial silhouette with a calcified aorta. Chronic interstitial lung changes are again seen. No pneumothorax. Tiny right pleural effusion with some mild lung base opacities are similar to previous. Overlapping cardiac leads. There is a right-sided PICC in place. Second potential catheter just above this at the right upper thorax. Please correlate with clinical history. IMPRESSION: No significant interval change. Electronically Signed   By: Ranell Bring M.D.   On: 11/23/2023 10:02    Medications:   Current Medications:  aspirin  EC  81 mg Oral Daily   atorvastatin   80 mg Oral Daily  Chlorhexidine  Gluconate Cloth  6 each Topical Daily   clopidogrel   75 mg Oral Q breakfast   enoxaparin (LOVENOX) injection  30 mg Subcutaneous Daily   levothyroxine   88 mcg Oral Daily   sodium chloride  flush  10-40 mL Intracatheter Q12H   Infusions:  amiodarone 30 mg/hr (11/24/23 0618)   norepinephrine  (LEVOPHED ) Adult infusion 2 mcg/min (11/24/23 0618)   piperacillin-tazobactam (ZOSYN)  IV 12.5 mL/hr at 11/24/23 0618   potassium chloride 10 mEq (11/24/23 0638)   vancomycin Stopped (11/23/23 1426)   vasopressin  Stopped (11/23/23 1035)    Patient Profile   Kathryn Cuevas is a 89 y.o. female with recent NSTEMI,  HLD and hypothyroid. Admitted post PCI post complication.   Assessment/Plan  CAD> post PCI perforation> pericardial effusion> tamponade> brief cardiac arrest  - LHC this admission with mid Cx 30% stenosed, prox RCA 80% stenosed, 2nd Diag 70% stenosed. Previously treated mid LAD and prox LAD. DES placed in prox RCA - Perforated side branch of mid RCA during procedure resulting in effusion>tamponade.  - Continue Plavix  and ASA - Continue lipitor 80mg  daily - LVEF recovered, was 45-50% post NSTEMI 10/28/23.  - Pericardial drain removed at bedside on 11/22/23 - Repeat TTE 6/29 with LVEF 70%, no pericardial effusion.  - Continue levophed ; continue to wean off. Now off vaso. Suspect she has some degree of vasoplegia from underlying sepsis. Now on broad spec abx now on day 4. WBC ct 18.6>13>16.3>12. Plan to stop abx tomorrow.   2. Hypoxic respiratory failure  - Pleural effusion on TTE 6/29, now s/p thoracentesis. - daily - She has had a persistent O2 requirement.  - IVC dilated to 2.6cm. Diuresed with 80 IV yesterday, -2.9L UOP. Stop IV lasix   3. A fib RVR - Morning of 6/29 - No reoccurrence - If it happens again then would consider AC - Stop amiodarone gtt, start amiodarone 200 mg BIDD  Continue to get OOB and ambulate.   Length of Stay: 4  Beckey LITTIE Coe, NP  11/24/2023, 7:08 AM  Advanced Heart Failure Team Pager [phone] (M-F; 7a - 5p)  Please contact [facility] for night-coverage after hours (4p -7a ) and weekends on [email or web address]   CRITICAL CARE Performed by: Beckey LITTIE Coe  Total critical care time: 12 minutes  Critical care time was exclusive of separately billable procedures and treating other patients.  Critical care was necessary to treat or prevent imminent or life-threatening deterioration.  Critical care was time spent personally by me on the following activities: development of treatment plan with patient and/or surrogate as well as nursing, discussions with  consultants, evaluation of patient's response to treatment, examination of patient, obtaining history from patient or surrogate, ordering and performing treatments and interventions, ordering and review of laboratory studies, ordering and review of radiographic studies, pulse oximetry and re-evaluation of patient's condition.   Patient seen with NP, I formulated the plan and agree with the above note.   She remains on 6L [place].  CXR showed chronic interstitial changes.    She is now off vasopressin , remains on NE 2.   Good diuresis yesterday, I/Os net negative 1198.  CVP 9-10 today with stable creatinine 0.85 but low K.   Denies dyspnea/chest pain.  Has been up out of bed.   No further AF, remains on amiodarone gtt.   General: NAD, frail Neck: No JVD, no thyromegaly or thyroid  nodule.  Lungs: Mild crackles at bases.  CV: Nondisplaced PMI.  Heart regular S1/S2, no S3/S4, no  murmur.  No peripheral edema.   Abdomen: Soft, nontender, no hepatosplenomegaly, no distention.  Skin: Intact without lesions or rashes.  Neurologic: Alert and oriented x 3.  Psych: Normal affect. Extremities: No clubbing or cyanosis.  HEENT: Normal.   Patient still has a significant oxygen requirement, 6L [place].  Suspect component of COPD, was a heavy smoker in the past though she denies use of oxygen at home.  Echo post-PCI showed EF 70-75%, mild RV dysfunction.  CVP today is 9-10. Good diuresis yesterday.  - Will give 1 more IV Lasix 80 mg dose with oxygen requirement and mildly elevated CVP.  - May end up needing home oxygen due to COPD.    Work on weaning completely off pressors today.  With wide pulse pressure, can wean for SBP > 95.  She is afebrile, WBCs elevated but trending down and PCT not elevated.  Covering for possible septic/distributive shock component with vancomycin/Zosyn.  - Hopefully can stop NE this morning, only on 2.  - Antibiotics to stop today.    She remains in NSR on IV amiodarone.  She had  only about 2 hrs of AF in setting of pericardial irritation.  She is on DAPT.   - Can transition today to amiodarone 200 mg bid and can continue po amiodarone x 1 month if no further AF.  - If no further AF, would hold off on anticoagulation.  If she has further AF, will need to anticoagulate (stop ASA in that event).   Mobilize.   Kathryn Cuevas 11/24/2023 10:15 AM

## 2023-11-24 NOTE — Progress Notes (Signed)
 K 2.8, 60 K through central line ordered. Repeat BMP ordered for 1200 today. Mg 2.2.

## 2023-11-25 ENCOUNTER — Encounter (HOSPITAL_COMMUNITY): Payer: Self-pay | Admitting: Cardiology

## 2023-11-25 DIAGNOSIS — I209 Angina pectoris, unspecified: Secondary | ICD-10-CM | POA: Diagnosis not present

## 2023-11-25 LAB — BASIC METABOLIC PANEL WITH GFR
Anion gap: 10 (ref 5–15)
BUN: 16 mg/dL (ref 8–23)
CO2: 30 mmol/L (ref 22–32)
Calcium: 7.8 mg/dL — ABNORMAL LOW (ref 8.9–10.3)
Chloride: 94 mmol/L — ABNORMAL LOW (ref 98–111)
Creatinine, Ser: 0.8 mg/dL (ref 0.44–1.00)
GFR, Estimated: >60 mL/min (ref >60)
Glucose, Bld: 83 mg/dL (ref 70–99)
Potassium: 3.5 mmol/L (ref 3.5–5.1)
Sodium: 134 mmol/L — ABNORMAL LOW (ref 135–145)

## 2023-11-25 LAB — CBC
HCT: 27.3 % — ABNORMAL LOW (ref 36.0–46.0)
Hemoglobin: 9 g/dL — ABNORMAL LOW (ref 12.0–15.0)
MCH: 34 pg (ref 26.0–34.0)
MCHC: 33 g/dL (ref 30.0–36.0)
MCV: 103 fL — ABNORMAL HIGH (ref 80.0–100.0)
Platelets: 229 10*3/uL (ref 150–400)
RBC: 2.65 MIL/uL — ABNORMAL LOW (ref 3.87–5.11)
RDW: 14.2 % (ref 11.5–15.5)
WBC: 10 10*3/uL (ref 4.0–10.5)
nRBC: 0.2 % (ref 0.0–0.2)

## 2023-11-25 LAB — MAGNESIUM: Magnesium: 2 mg/dL (ref 1.7–2.4)

## 2023-11-25 MED ORDER — POTASSIUM CHLORIDE 10 MEQ/50ML IV SOLN
10.0000 meq | INTRAVENOUS | Status: AC
  Administered 2023-11-25 (×2): 10 meq via INTRAVENOUS
  Filled 2023-11-25 (×2): qty 50

## 2023-11-25 MED ORDER — ENOXAPARIN SODIUM 40 MG/0.4ML IJ SOSY
40.0000 mg | PREFILLED_SYRINGE | Freq: Every day | INTRAMUSCULAR | Status: DC
  Administered 2023-11-25 – 2023-12-01 (×7): 40 mg via SUBCUTANEOUS
  Filled 2023-11-25 (×7): qty 0.4

## 2023-11-25 MED ORDER — POTASSIUM CHLORIDE CRYS ER 10 MEQ PO TBCR
20.0000 meq | EXTENDED_RELEASE_TABLET | Freq: Once | ORAL | Status: AC
  Administered 2023-11-25: 20 meq via ORAL
  Filled 2023-11-25: qty 2

## 2023-11-25 MED ORDER — POTASSIUM CHLORIDE CRYS ER 20 MEQ PO TBCR: 60.0000 meq | EXTENDED_RELEASE_TABLET | Freq: Once | ORAL | Status: DC

## 2023-11-25 MED ORDER — POTASSIUM CHLORIDE 10 MEQ/50ML IV SOLN: 10.0000 meq | INTRAVENOUS | Status: DC

## 2023-11-25 NOTE — Plan of Care (Signed)
  Problem: Education: Goal: Understanding of CV disease, CV risk reduction, and recovery process will improve Outcome: Progressing Goal: Individualized Educational Video(s) Outcome: Progressing   Problem: Activity: Goal: Ability to return to baseline activity level will improve Outcome: Progressing   Problem: Cardiovascular: Goal: Ability to achieve and maintain adequate cardiovascular perfusion will improve Outcome: Progressing   Problem: Health Behavior/Discharge Planning: Goal: Ability to safely manage health-related needs after discharge will improve Outcome: Progressing   Problem: Education: Goal: Knowledge of General Education information will improve Description: Including pain rating scale, medication(s)/side effects and non-pharmacologic comfort measures Outcome: Progressing   Problem: Health Behavior/Discharge Planning: Goal: Ability to manage health-related needs will improve Outcome: Progressing   Problem: Clinical Measurements: Goal: Ability to maintain clinical measurements within normal limits will improve Outcome: Progressing Goal: Will remain free from infection Outcome: Progressing Goal: Diagnostic test results will improve Outcome: Progressing Goal: Respiratory complications will improve Outcome: Progressing Goal: Cardiovascular complication will be avoided Outcome: Progressing   Problem: Activity: Goal: Risk for activity intolerance will decrease Outcome: Progressing   Problem: Nutrition: Goal: Adequate nutrition will be maintained Outcome: Progressing   Problem: Coping: Goal: Level of anxiety will decrease Outcome: Progressing   Problem: Elimination: Goal: Will not experience complications related to bowel motility Outcome: Progressing Goal: Will not experience complications related to urinary retention Outcome: Progressing   Problem: Pain Managment: Goal: General experience of comfort will improve and/or be controlled Outcome:  Progressing   Problem: Safety: Goal: Ability to remain free from injury will improve Outcome: Progressing   Problem: Skin Integrity: Goal: Risk for impaired skin integrity will decrease Outcome: Progressing

## 2023-11-25 NOTE — Progress Notes (Addendum)
 Advanced Heart Failure Team Consult Note   Primary Physician: Okey Carlin Redbird, MD Cardiologist:  Alm Clay, MD  Reason for Consultation: Cardiac tamponade  Interval hx  - pericardial drain removed 6/28  - s/p thoracentesis 6/29.   - Stable off vaso, continues on levo at 2.  - Afib with RVR briefly morning of 6/29; now on PO amio with rates 50s-60s, maintaining NSR.   CVP 5, K 3.5. Diuresed yesterday with IV lasix. -2.5L UOP. Renal function stable.   Feels fine, resting in bed. Denies CP/SOB. Has been getting up and moving around with PT.    Objective:    Vital Signs:   Temp:  [97.2 F (36.2 C)-98 F (36.7 C)] 97.7 F (36.5 C) (07/02 0356) Pulse Rate:  [50-66] 61 (07/02 0703) Resp:  [11-22] 12 (07/02 0703) BP: (88-119)/(43-86) 112/55 (07/02 0703) SpO2:  [84 %-100 %] 98 % (07/02 0703) Weight:  [47.7 kg] 47.7 kg (07/02 0500) Last BM Date : 11/21/23  Weight change: Filed Weights   11/19/23 0733 11/24/23 0500 11/25/23 0500  Weight: 49.9 kg 49.3 kg 47.7 kg   Intake/Output:   Intake/Output Summary (Last 24 hours) at 11/25/2023 0710 Last data filed at 11/24/2023 2353 Gross per 24 hour  Intake 841.97 ml  Output 2550 ml  Net -1708.03 ml    Physical Exam  General:  elderly/frail appearing.  No respiratory difficulty Neck:  JVD ~5 cm.  Cor: PMI nondisplaced. Regular rate & rhythm. No rubs, gallops or murmurs. Lungs: clear Extremities: no cyanosis, clubbing, rash, edema. PICC RUE Neuro: alert & oriented x 3. Affect pleasant.  Telemetry   NSR 60s, intt PVCs(Personally reviewed)    EKG    No new EKG to review  Labs   Basic Metabolic Panel: Recent Labs  Lab 11/21/23 0440 11/22/23 0500 11/23/23 0430 11/24/23 0408 11/24/23 1317 11/25/23 0355  NA 136 130* 128* 133* 130* 134*  K 4.3 3.7 4.1 2.8* 3.9 3.5  CL 107 104 96* 97* 92* 94*  CO2 23 23 21* 30 29 30   GLUCOSE 127* 114* 156* 99 130* 83  BUN 28* 26* 22 18 17 16   CREATININE 0.87 0.80 0.81 0.85 0.81  0.80  CALCIUM  8.3* 7.9* 7.7* 7.7* 8.0* 7.8*  MG 1.9 2.1 1.7 2.2  --  2.0   Liver Function Tests: Recent Labs  Lab 11/22/23 1538  PROT 4.9*   No results for input(s): LIPASE, AMYLASE in the last 168 hours. No results for input(s): AMMONIA in the last 168 hours.  CBC: Recent Labs  Lab 11/21/23 0440 11/22/23 0500 11/23/23 0430 11/24/23 0408 11/25/23 0355  WBC 18.6* 13.0* 16.3* 12.0* 10.0  HGB 9.6* 8.8* 9.4* 8.9* 9.0*  HCT 28.7* 26.3* 28.0* 26.2* 27.3*  MCV 102.1* 101.5* 98.9 100.8* 103.0*  PLT 188 169 199 203 229   Imaging   No results found.   Medications:   Current Medications:  amiodarone  200 mg Oral BID   aspirin  EC  81 mg Oral Daily   atorvastatin   80 mg Oral Daily   Chlorhexidine  Gluconate Cloth  6 each Topical Daily   clopidogrel   75 mg Oral Q breakfast   enoxaparin (LOVENOX) injection  30 mg Subcutaneous Daily   levothyroxine   88 mcg Oral Daily   potassium chloride  60 mEq Oral Once   sodium chloride  flush  10-40 mL Intracatheter Q12H   Infusions:  norepinephrine  (LEVOPHED ) Adult infusion 2 mcg/min (11/24/23 1200)   piperacillin-tazobactam (ZOSYN)  IV 3.375 g (11/25/23 0356)  vancomycin 100 mL/hr at 11/24/23 1200    Patient Profile   Kathryn Cuevas is a 89 y.o. female with recent NSTEMI, HLD and hypothyroid. Admitted post PCI post complication.   Assessment/Plan  CAD> post PCI perforation> pericardial effusion> tamponade> brief cardiac arrest  - LHC this admission with mid Cx 30% stenosed, prox RCA 80% stenosed, 2nd Diag 70% stenosed. Previously treated mid LAD and prox LAD. DES placed in prox RCA - Perforated side branch of mid RCA during procedure resulting in effusion>tamponade.  - Continue Plavix  and ASA - Continue lipitor 80mg  daily - LVEF recovered, was 45-50% post NSTEMI 10/28/23.  - Pericardial drain removed at bedside on 11/22/23 - Repeat TTE 6/29 with LVEF 70%, no pericardial effusion.  - Now stable off levo and vaso.  - Suspect she has  some degree of vasoplegia from underlying sepsis. Now on broad spec abx now on day 5. WBC ct 18.6>13>16.3>12>10. Last dose this morning.    2. Hypoxic respiratory failure  - Pleural effusion on TTE 6/29, now s/p thoracentesis. - daily - She has had a persistent O2 requirement.  - IVC dilated to 2.6cm. Diuresed with 80 IV yesterday, -2.5L UOP. Stop IV lasix.   3. A fib RVR - Morning of 6/29 for ~2hrs - No reoccurrence - If it happens again then would consider AC - Continue amiodarone 200 mg BID  Continue to get OOB and ambulate. Plan for transfer to PCU today  Length of Stay: 5  Beckey LITTIE Coe, NP  11/25/2023, 7:10 AM  Advanced Heart Failure Team Pager [phone] (M-F; 7a - 5p)  Please contact [facility] for night-coverage after hours (4p -7a ) and weekends on [email or web address]   CRITICAL CARE Performed by: Beckey LITTIE Coe  Total critical care time: 11 minutes  Critical care time was exclusive of separately billable procedures and treating other patients.  Critical care was necessary to treat or prevent imminent or life-threatening deterioration.  Critical care was time spent personally by me on the following activities: development of treatment plan with patient and/or surrogate as well as nursing, discussions with consultants, evaluation of patient's response to treatment, examination of patient, obtaining history from patient or surrogate, ordering and performing treatments and interventions, ordering and review of laboratory studies, ordering and review of radiographic studies, pulse oximetry and re-evaluation of patient's condition.   Patient seen with NP, I formulated the plan and agree with the above note.   Good diuresis again yesterday.  She remains on 5L [place].  CVP 5 today.  Creatinine stable.  No chest pain.    BP now stable off pressors.   General: NAD Neck: No JVD, no thyromegaly or thyroid  nodule.  Lungs: Distant BS. CV: Nondisplaced PMI.  Heart regular S1/S2, no S3/S4,  no murmur.  No peripheral edema.   Abdomen: Soft, nontender, no hepatosplenomegaly, no distention.  Skin: Intact without lesions or rashes.  Neurologic: Alert and oriented x 3.  Psych: Normal affect. Extremities: No clubbing or cyanosis.  HEENT: Normal.   Patient still has a significant oxygen requirement, 5L [place].  Suspect component of COPD, was a heavy smoker in the past though she denies use of oxygen at home.  Echo post-PCI showed EF 70-75%, mild RV dysfunction.  CVP today is 5. Good diuresis yesterday with IV Lasix.  - I think we can hold off on further Lasix with low CVP.  - May end up needing home oxygen due to COPD. Will work on weaning down today.  She is now off pressors.  Covered for possible septic/distributive shock component with vancomycin/Zosyn.  - Now off abx.    She remains in NSR on po amiodarone.  She had only about 2 hrs of AF in setting of pericardial irritation.  She is on DAPT.   - Continue po amiodarone x 1 month if no further AF.  - If no further AF, would hold off on anticoagulation.  If she has further AF, will need to anticoagulate (stop ASA in that event).   Ezra Shuck 11/25/2023 11:20 AM

## 2023-11-25 NOTE — Progress Notes (Signed)
 Physical Therapy Treatment Patient Details Name: Kathryn Cuevas MRN: [id number] DOB: 1931-09-02 Today's Date: 11/25/2023   History of Present Illness 89 yo female with recent NSTEMI  admitted 6/26 post PCI complication s/p thoracentesis 6/29 Afib with RVR. 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    PT Comments  Pt received in chair, requesting return to supine, but agreeable to gait trial short distance with therapist prior to return to supine. Pt needing up to minA for stability with transfers and gait trial, and cues for line awareness/safety, close Supervision for static standing with BUE support. SpO2 WFL on 4L O2 [place] with exertion and 3L O2 [place] at rest post-exertion. Pt needing assist to call cafeteria to change dinner order, and did not seem to understand hold music/needing to wait for live operator before making her order. Patient will benefit from continued inpatient follow up therapy, <3 hours/day.    If plan is discharge home, recommend the following: A little help with walking and/or transfers;A little help with bathing/dressing/bathroom;Assistance with cooking/housework;Assist for transportation;Help with stairs or ramp for entrance   Can travel by private vehicle     Yes  Equipment Recommendations  None recommended by PT    Recommendations for Other Services       Precautions / Restrictions Precautions Precautions: Fall Recall of Precautions/Restrictions: Impaired Precaution/Restrictions Comments: watch O2 Restrictions Weight Bearing Restrictions Per Provider Order: No     Mobility  Bed Mobility Overal bed mobility: Needs Assistance Bed Mobility: Sit to Supine     Supine to sit: Min assist, Used rails Sit to supine: Min assist, Used rails   General bed mobility comments: light BLE assist and cues for line mgmt/awareness and  safety.    Transfers Overall transfer level: Needs assistance Equipment used: Rolling walker (2 wheels) Transfers: Sit to/from Stand, Bed to chair/wheelchair/BSC Sit to Stand: Min assist Stand pivot transfers: Mod assist         General transfer comment: chair>RW and RW>EOB, cues for safe UE placement needed    Ambulation/Gait Ambulation/Gait assistance: Contact guard assist, Min assist Gait Distance (Feet): 40 Feet Assistive device: Rolling walker (2 wheels) Gait Pattern/deviations: Step-through pattern, Decreased stride length, Shuffle, Trunk flexed       General Gait Details: Moderate dyspnea with minimal exertion, pt needs more assist to manage RW during turns as she fatigues, up to minA and with backward stepping. SpO2 desat on 3L so needed 4L O2 [place] to maintain SpO2 89% with gait trial.   Stairs             Wheelchair Mobility     Tilt Bed    Modified Rankin (Stroke Patients Only)       Balance Overall balance assessment: Needs assistance Sitting-balance support: Feet supported, Single extremity supported Sitting balance-Leahy Scale: Fair Sitting balance - Comments: supervision for safety   Standing balance support: Reliant on assistive device for balance, Single extremity supported, During functional activity Standing balance-Leahy Scale: Poor                              Communication Communication Communication: Impaired Factors Affecting Communication: Hearing impaired  Cognition Arousal: Alert Behavior During Therapy: WFL for tasks assessed/performed   PT - Cognitive impairments: No family/caregiver present to determine baseline, Attention, Sequencing, Problem solving, Safety/Judgement  PT - Cognition Comments: Cooperative Following commands: Intact      Cueing Cueing Techniques: Verbal cues, Gestural cues  Exercises Other Exercises Other Exercises: standing hip flexion x5 reps prior to gait  trial    General Comments General comments (skin integrity, edema, etc.): SpO2 87% on RA with seated posture, needs 4L O2 [place] to maintain SpO2 89% and above with exertional tasks (gait)      Pertinent Vitals/Pain Pain Assessment Pain Assessment: No/denies pain    Home Living                          Prior Function            PT Goals (current goals can now be found in the care plan section) Acute Rehab PT Goals Patient Stated Goal: Get well return home PT Goal Formulation: With patient Time For Goal Achievement: 12/08/23 Progress towards PT goals: Progressing toward goals    Frequency    Min 2X/week      PT Plan      Co-evaluation              AM-PAC PT 6 Clicks Mobility   Outcome Measure  Help needed turning from your back to your side while in a flat bed without using bedrails?: A Little Help needed moving from lying on your back to sitting on the side of a flat bed without using bedrails?: A Little Help needed moving to and from a bed to a chair (including a wheelchair)?: A Little Help needed standing up from a chair using your arms (e.g., wheelchair or bedside chair)?: A Little Help needed to walk in hospital room?: A Lot Help needed climbing 3-5 steps with a railing? : Total 6 Click Score: 15    End of Session Equipment Utilized During Treatment: Gait belt;Oxygen Activity Tolerance: Patient limited by fatigue Patient left: with call bell/phone within reach;in bed;with bed alarm set;Other (comment) (bed in chair posture as her food tray is arriving) Nurse Communication: Mobility status PT Visit Diagnosis: Muscle weakness (generalized) (M62.81);Difficulty in walking, not elsewhere classified (R26.2);Other abnormalities of gait and mobility (R26.89);Unsteadiness on feet (R26.81)     Time: 8359-8297 PT Time Calculation (min) (ACUTE ONLY): 22 min  Charges:    $Gait Training: 8-22 mins PT General Charges $$ ACUTE PT VISIT: 1 Visit                      Elissia Spiewak P., PTA Acute Rehabilitation Services Secure Chat Preferred 9a-5:30pm Office: [phone]    Connell HERO [facility] 11/25/2023, 5:36 PM

## 2023-11-25 NOTE — Progress Notes (Signed)
 Physical Therapy Treatment Patient Details Name: Kathryn Cuevas MRN: [id number] DOB: Aug 31, 1931 Today's Date: 11/25/2023   History of Present Illness 89 yo female with recent NSTEMI  admitted 6/26 post PCI complication s/p thoracentesis 6/29 Afib with RVR. 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    PT Comments  Pt received in supine, agreeable to therapy session with encouragement. Pt quick to fatigue, needing increased time/effort to perform bed mobility and stand pivot transfer from bed to chair, up to minA via HHA with pivot and cues for line awareness. Assessed pt SpO2 on RA, pt desat to 87% and did not improve with cues for pursed-lip breathing for 2-3 minutes so returned to 2.5L O2 [place] and SpO2 92% and above on supplemental O2. Pt continues to benefit from PT services to progress toward functional mobility goals, pt fatigued once up in chair and agreeable to a rest break prior to gait progression in room/hallway, requesting PTA return to her room.   If plan is discharge home, recommend the following: A little help with walking and/or transfers;A little help with bathing/dressing/bathroom;Assistance with cooking/housework;Assist for transportation;Help with stairs or ramp for entrance   Can travel by private vehicle     Yes  Equipment Recommendations  None recommended by PT    Recommendations for Other Services       Precautions / Restrictions Precautions Precautions: Fall Recall of Precautions/Restrictions: Impaired Precaution/Restrictions Comments: watch O2 Restrictions Weight Bearing Restrictions Per Provider Order: No     Mobility  Bed Mobility Overal bed mobility: Needs Assistance Bed Mobility: Supine to Sit     Supine to sit: Min assist, Used rails     General bed mobility comments: from flat bed minA with heavy reliance on  bed rail and HHA for trunk support    Transfers Overall transfer level: Needs assistance Equipment used: 1 person hand held assist Transfers: Sit to/from Stand, Bed to chair/wheelchair/BSC Sit to Stand: Min assist Stand pivot transfers: Mod assist         General transfer comment: EOB>RW and RW>chair, cues for safe UE placement, needs assist to lift and lower, modA HHA pivoting due to crouched posture and posterior instability    Ambulation/Gait                   Stairs             Wheelchair Mobility     Tilt Bed    Modified Rankin (Stroke Patients Only)       Balance Overall balance assessment: Needs assistance Sitting-balance support: Feet supported, Single extremity supported Sitting balance-Leahy Scale: Fair Sitting balance - Comments: supervision for safety   Standing balance support: Reliant on assistive device for balance, Single extremity supported Standing balance-Leahy Scale: Poor                              Communication Communication Communication: Impaired Factors Affecting Communication: Hearing impaired  Cognition Arousal: Alert Behavior During Therapy: WFL for tasks assessed/performed   PT - Cognitive impairments: No family/caregiver present to determine baseline, Attention, Sequencing, Problem solving, Safety/Judgement                       PT - Cognition Comments: Cooperative Following commands: Intact      Cueing Cueing Techniques: Verbal cues, Gestural cues  Exercises  General Comments General comments (skin integrity, edema, etc.): SpO2 87% on RA with seated posture, needs 4L O2 [place] to maintain SpO2 89% and above with exertional tasks and 3L O2 [place] while seated up in chair. HR WFL      Pertinent Vitals/Pain Pain Assessment Pain Assessment: No/denies pain    Home Living                          Prior Function            PT Goals (current goals can now be found in the care  plan section) Acute Rehab PT Goals Patient Stated Goal: Get well return home PT Goal Formulation: With patient Time For Goal Achievement: 12/08/23 Progress towards PT goals: Progressing toward goals    Frequency    Min 2X/week      PT Plan      Co-evaluation              AM-PAC PT 6 Clicks Mobility   Outcome Measure  Help needed turning from your back to your side while in a flat bed without using bedrails?: A Little Help needed moving from lying on your back to sitting on the side of a flat bed without using bedrails?: A Little Help needed moving to and from a bed to a chair (including a wheelchair)?: A Little Help needed standing up from a chair using your arms (e.g., wheelchair or bedside chair)?: A Little Help needed to walk in hospital room?: A Lot Help needed climbing 3-5 steps with a railing? : Total 6 Click Score: 15    End of Session Equipment Utilized During Treatment: Gait belt;Oxygen Activity Tolerance: Patient limited by fatigue Patient left: in chair;with call bell/phone within reach;with chair alarm set Nurse Communication: Mobility status PT Visit Diagnosis: Muscle weakness (generalized) (M62.81);Difficulty in walking, not elsewhere classified (R26.2);Other abnormalities of gait and mobility (R26.89);Unsteadiness on feet (R26.81)     Time: 8454-8398 PT Time Calculation (min) (ACUTE ONLY): 16 min  Charges:    $Therapeutic Activity: 8-22 mins PT General Charges $$ ACUTE PT VISIT: 1 Visit                     Kassie Keng P., PTA Acute Rehabilitation Services Secure Chat Preferred 9a-5:30pm Office: [phone]    Connell HERO [facility] 11/25/2023, 5:25 PM

## 2023-11-25 NOTE — Progress Notes (Signed)
 SATURATION QUALIFICATIONS: (This note is used to comply with regulatory documentation for home oxygen)  Patient Saturations on Room Air at Rest = 87%  Patient Saturations on Room Air while Ambulating = N/A  Patient Saturations on 4 Liters of oxygen while Ambulating = 89%  Please briefly explain why patient needs home oxygen: Pt hypoxic on RA with exertional tasks.

## 2023-11-26 DIAGNOSIS — I3139 Other pericardial effusion (noninflammatory): Secondary | ICD-10-CM

## 2023-11-26 DIAGNOSIS — Z955 Presence of coronary angioplasty implant and graft: Secondary | ICD-10-CM

## 2023-11-26 DIAGNOSIS — I2510 Atherosclerotic heart disease of native coronary artery without angina pectoris: Principal | ICD-10-CM

## 2023-11-26 HISTORY — DX: Presence of coronary angioplasty implant and graft: Z95.5

## 2023-11-26 HISTORY — DX: Other pericardial effusion (noninflammatory): I31.39

## 2023-11-26 LAB — BASIC METABOLIC PANEL WITH GFR
Anion gap: 6 (ref 5–15)
BUN: 17 mg/dL (ref 8–23)
CO2: 28 mmol/L (ref 22–32)
Calcium: 7.9 mg/dL — ABNORMAL LOW (ref 8.9–10.3)
Chloride: 95 mmol/L — ABNORMAL LOW (ref 98–111)
Creatinine, Ser: 0.83 mg/dL (ref 0.44–1.00)
GFR, Estimated: >60 mL/min (ref >60)
Glucose, Bld: 90 mg/dL (ref 70–99)
Potassium: 4.1 mmol/L (ref 3.5–5.1)
Sodium: 129 mmol/L — ABNORMAL LOW (ref 135–145)

## 2023-11-26 LAB — BODY FLUID CULTURE W GRAM STAIN
Culture: NO GROWTH
Gram Stain: NONE SEEN

## 2023-11-26 LAB — CBC
HCT: 28.5 % — ABNORMAL LOW (ref 36.0–46.0)
Hemoglobin: 9.5 g/dL — ABNORMAL LOW (ref 12.0–15.0)
MCH: 34.4 pg — ABNORMAL HIGH (ref 26.0–34.0)
MCHC: 33.3 g/dL (ref 30.0–36.0)
MCV: 103.3 fL — ABNORMAL HIGH (ref 80.0–100.0)
Platelets: 279 10*3/uL (ref 150–400)
RBC: 2.76 MIL/uL — ABNORMAL LOW (ref 3.87–5.11)
RDW: 14.4 % (ref 11.5–15.5)
WBC: 12 10*3/uL — ABNORMAL HIGH (ref 4.0–10.5)
nRBC: 0.2 % (ref 0.0–0.2)

## 2023-11-26 LAB — MAGNESIUM: Magnesium: 2 mg/dL (ref 1.7–2.4)

## 2023-11-26 NOTE — TOC Progression Note (Addendum)
 Transition of Care [facility]) - Progression Note    Patient Details  Name: Kathryn Cuevas MRN: [id number] Date of Birth: 1932/03/28  Transition of [facility]) CM/SW Contact  Luise JAYSON Pan, [place] Phone Number: 11/26/2023, 10:08 AM  Clinical Narrative:   CSW met with patient on 7/2 but patient was followed by Heart Failure team. CSW communicated with HF CSW/NCM that patient stated she did not want SNF. Patient stated she wanted to continue to work with PT during hospitalization and work up to going home. CSW inquired if patient would like HH. Patient stated yes but would like NCM to follow up closer to discharge. At this time, HF team has signed off and unit CSW/NCM to follow.   4:29 PM CSW met patient and [name] at bedside to clarify plans for disposition. At this time, patient is agreeable to SNF STR. [name] requested [facility] as it is closest to her and she would like to be able to visit. CSW will submit SNF referrals at this time. Will need follow up with bed offers and [facility]. Patient gave [facility] permission to contact [name] with updates.   TOC will continue to follow.    Expected Discharge Plan: Home/Self Care Barriers to Discharge: Continued Medical Work up  Expected Discharge Plan and Services       Living arrangements for the past 2 months: Single Family Home                                       Social Determinants of Health (SDOH) Interventions SDOH Screenings   Food Insecurity: Patient Unable To Answer (11/21/2023)  Housing: Patient Unable To Answer (11/21/2023)  Transportation Needs: Patient Unable To Answer (11/21/2023)  Utilities: Patient Unable To Answer (11/21/2023)  Social Connections: Patient Unable To Answer (11/21/2023)  Recent Concern: Social Connections - Socially Isolated (10/28/2023)  Tobacco Use: High Risk (11/25/2023)    Readmission Risk Interventions    10/29/2023    2:00 PM  Readmission Risk Prevention Plan  Post Dischage Appt Complete  Medication Screening  Complete  Transportation Screening Complete

## 2023-11-26 NOTE — Plan of Care (Signed)
   Problem: Education: Goal: Understanding of CV disease, CV risk reduction, and recovery process will improve Outcome: Progressing

## 2023-11-26 NOTE — NC FL2 (Signed)
 [place]  MEDICAID FL2 LEVEL OF CARE FORM     IDENTIFICATION  Patient Name: Kathryn Cuevas Birthdate: [birth year] Sex: female Admission Date (Current Location): 11/19/2023  [facility] and [place] Number:  Producer, television/film/video and Address:  The . [facility], [address], [place], [place] [address]      Provider Number: [id number]  Attending Physician Name and Address:  Rolan Ezra RAMAN, MD  Relative Name and Phone Number:       Current Level of Care: Hospital Recommended Level of Care: Skilled Nursing Facility Prior Approval Number:    Date Approved/Denied:   PASRR Number: [id number]  Discharge Plan: SNF    Current Diagnoses: Patient Active Problem List   Diagnosis Date Noted   Pericardial effusion 11/26/2023   Coronary artery disease involving native coronary artery of native heart without angina pectoris 11/26/2023   S/P coronary artery stent placement 11/26/2023   Cardiac tamponade 11/20/2023   Angina pectoris (HCC) 11/19/2023   NSTEMI (non-ST elevated myocardial infarction) (HCC) 10/27/2023   Chronic obstructive pulmonary disease (HCC) 10/27/2023   Hypothyroidism 10/27/2023   Prediabetes 10/27/2023   Macrocytosis 10/27/2023   ACS (acute coronary syndrome) (HCC) 10/27/2023   Hypocalcemia 10/27/2023   Tobacco abuse 10/27/2023   Amaurosis fugax of right eye 09/22/2017   Lumbar radiculopathy 09/24/2015    Orientation RESPIRATION BLADDER Height & Weight     Self, Time, Situation, Place  O2 (2L [place]) Continent, External catheter Weight: 97 lb (44 kg) Height:  5' 3 (160 cm)  BEHAVIORAL SYMPTOMS/MOOD NEUROLOGICAL BOWEL NUTRITION STATUS      Continent Diet (see dc summary)  AMBULATORY STATUS COMMUNICATION OF NEEDS Skin   Extensive Assist Verbally Normal                       Personal Care Assistance Level of Assistance  Bathing, Feeding, Dressing Bathing Assistance: Maximum assistance Feeding assistance: Limited assistance Dressing  Assistance: Maximum assistance     Functional Limitations Info  Sight, Hearing, Speech Sight Info: Impaired (eyeglasses) Hearing Info: Impaired Speech Info: Adequate    SPECIAL CARE FACTORS FREQUENCY  PT (By licensed PT), OT (By licensed OT)     PT Frequency: 5x week OT Frequency: 5x week            Contractures Contractures Info: Not present    Additional Factors Info  Code Status, Allergies Code Status Info: Full Allergies Info: Levothyroxine , Augmentin (Amoxicillin-pot Clavulanate), Sulfa Antibiotics           Current Medications (11/26/2023):  This is the current hospital active medication list Current Facility-Administered Medications  Medication Dose Route Frequency Provider Last Rate Last Admin   acetaminophen  (TYLENOL ) tablet 650 mg  650 mg Oral Q4H PRN [place], Peter M, MD   650 mg at 11/19/23 2046   amiodarone (PACERONE) tablet 200 mg  200 mg Oral BID McLean, Dalton S, MD   200 mg at 11/26/23 9062   aspirin  EC tablet 81 mg  81 mg Oral Daily Jordan, Peter M, MD   81 mg at 11/26/23 9062   atorvastatin  (LIPITOR) tablet 80 mg  80 mg Oral Daily Sabharwal, Aditya, DO   80 mg at 11/25/23 1717   Chlorhexidine  Gluconate Cloth 2 % PADS 6 each  6 each Topical Daily Zenaida Morene PARAS, MD   6 each at 11/26/23 0940   clopidogrel  (PLAVIX ) tablet 75 mg  75 mg Oral Q breakfast [place], Peter M, MD   75 mg at 11/26/23  0937   enoxaparin (LOVENOX) injection 40 mg  40 mg Subcutaneous Daily Lola Maurilio PENNER, RPH   40 mg at 11/26/23 9062   levothyroxine  (SYNTHROID ) tablet 88 mcg  88 mcg Oral Daily [place], Peter M, MD   88 mcg at 11/26/23 0505   nitroGLYCERIN  (NITROSTAT ) SL tablet 0.4 mg  0.4 mg Sublingual Q5 Min x 3 PRN [place], Peter M, MD       ondansetron  (ZOFRAN ) injection 4 mg  4 mg Intravenous Q6H PRN [place], Peter M, MD   4 mg at 11/19/23 1622   Oral care mouth rinse  15 mL Mouth Rinse PRN Zenaida Morene PARAS, MD       sodium chloride  flush (NS) 0.9 % injection 10-40 mL  10-40 mL  Intracatheter Q12H Sabharwal, Aditya, DO   10 mL at 11/26/23 0940   sodium chloride  flush (NS) 0.9 % injection 10-40 mL  10-40 mL Intracatheter PRN Sabharwal, Ria, DO         Discharge Medications: Please see discharge summary for a list of discharge medications.  Relevant Imaging Results:  Relevant Lab Results:   Additional Information SSN [id number]  Luise JAYSON Pan, LCSWA

## 2023-11-26 NOTE — TOC Progression Note (Signed)
 Transition of Care [facility]) - Progression Note    Patient Details  Name: Kathryn Cuevas MRN: [id number] Date of Birth: [birth year]  [facility]) CM/SW Contact  Justina Delcia Czar, RN Phone Number: [phone] 11/26/2023, 2:31 PM  Clinical Narrative:     TOC CM spoke pt's caregiver, [name]. States she is paid caregiver to assist pt in the home. Pt is requesting to go home with [facility]. She has declined SNF. States she would like to speak to CM with patient when she come to hospital. She is concerned and wants pt to be safe at home.   Pt active with [facility]. Will need resumption of care HH orders with [facility].   Expected Discharge Plan: Skilled Nursing Facility Barriers to Discharge: Continued Medical Work up  Expected Discharge Plan and Services   Discharge Planning Services: CM Consult   Living arrangements for the past 2 months: Single Family Home                                       Social Determinants of Health (SDOH) Interventions SDOH Screenings   Food Insecurity: Patient Unable To Answer (11/21/2023)  Housing: Patient Unable To Answer (11/21/2023)  Transportation Needs: Patient Unable To Answer (11/21/2023)  Utilities: Patient Unable To Answer (11/21/2023)  Social Connections: Patient Unable To Answer (11/21/2023)  Recent Concern: Social Connections - Socially Isolated (10/28/2023)  Tobacco Use: High Risk (11/25/2023)    Readmission Risk Interventions    10/29/2023    2:00 PM  Readmission Risk Prevention Plan  Post Dischage Appt Complete  Medication Screening Complete  Transportation Screening Complete

## 2023-11-26 NOTE — Progress Notes (Addendum)
 Progress Note  Patient Name: Kathryn Cuevas Date of Encounter: 11/26/2023 [facility] Cardiologist: Alm Clay, MD   Interval Summary   Patient resting in chair in the room Reports no chest pain, shortness of breath Remains on oxygen, will need at discharge Does not want to d/c to SNF, wants to go home with home health -- will be arranged prior to discharge   Vital Signs Vitals:   11/25/23 2019 11/26/23 0000 11/26/23 0341 11/26/23 0730  BP: 107/61 (!) 100/48 (!) 97/54 (!) 109/58  Pulse: 70 66 62 61  Resp: 20 10 14 12   Temp: 97.6 F (36.4 C) 97.7 F (36.5 C) (!) 97.3 F (36.3 C) 97.7 F (36.5 C)  TempSrc: Oral Oral Oral Oral  SpO2: 93% 91% 94% 93%  Weight:   44 kg   Height:       Intake/Output Summary (Last 24 hours) at 11/26/2023 1050 Last data filed at 11/26/2023 0828 Gross per 24 hour  Intake 352.94 ml  Output 300 ml  Net 52.94 ml      11/26/2023    3:41 AM 11/25/2023    1:00 PM 11/25/2023    5:00 AM  Last 3 Weights  Weight (lbs) 97 lb 97 lb 14.2 oz 105 lb 2.6 oz  Weight (kg) 44 kg 44.4 kg 47.7 kg     Telemetry/ECG  Sinus rhythm, HR 60-70s - Personally Reviewed  Physical Exam  GEN: No acute distress, on supplemental oxygen.   Neck: No JVD Cardiac: RRR, no murmurs, rubs, or gallops.  Respiratory: diminished breath sounds. GI: Soft, nontender, non-distended  MS: No edema  Assessment & Plan  Kathryn Cuevas is a 89 y.o. female with recent NSTEMI, HLD and hypothyroid. Admitted post PCI post complication   CAD s/p PCI to ostial RCA  Post PCI perforation Pericardial effusion, tamponade, brief cardiac arrest  LHC showed: mid Cx 30% stenosed, prox RCA 80% stenosed, 2nd Diag 70% stenosed. Previously treated mid LAD and prox LAD. DES placed in prox RCA  During procedure side branch of mid RCA was perforated  Resulting in pericardial effusion > tamponade Pericardial drain was placed during procedure, removed 6/29 EF post intervention recovered to 45-50% Repeat  echo 6/29 showed EF 70%, no pericardial effusion  Transferred out of ICU on 7/3 No longer requiring pressors Continue ASA + Plavix  Continue Lipitor 80 mg daily   Brief episode of atrial fibrillation with RVR Experienced episode of A. Fib with RVR 6/29, lasting around 2 hours No noted recurrence of A. Fib since that event Not currently on anticoagulation Continue amiodarone 200 mg BID  Consider long term AC if patient experienced another episode of A. Fib   Hypoxic respiratory failure  Pleural effusion s/p thoracentesis  Has persistent oxygen requirement  Thought to be related to COPD, but denies oxygen at baseline  Given IV Lasix, diuresed well, CVP 5 yesterday, Lasix was stopped  Will need oxygen at discharge   Disposition: She will need SNF placement.  Initially declined but now agreeable.  Will discuss with social worker   For questions or updates, please contact [facility] Please consult [email or web address] for contact info under       Signed, Kathryn DELENA Donath, PA-C   Patient seen and examined.  Agree with above documentation.  On exam, patient is alert and oriented, regular rate and rhythm, no murmurs, diminished breath sounds, no LE edema or JVD.  She presented for LHC, underwent PCI to RCA complicated by RCA perforation resulting  in tamponade status post pericardiocentesis.  She was transferred out of ICU yesterday.  Course was complicated by brief A-fib, she was started on amiodarone.  She has had persistent hypoxia despite good diuresis and CVP 5 yesterday.  Suspect likely has underlying COPD.  BP 109/58 this morning.  Labs notable for sodium 129, creatinine 0.83, hemoglobin 9.5.  She declined SNF placement initially, but now is agreeable.  Will consult social worker  Lonni LITTIE Nanas, MD

## 2023-11-27 LAB — CBC
HCT: 28.8 % — ABNORMAL LOW (ref 36.0–46.0)
Hemoglobin: 9.8 g/dL — ABNORMAL LOW (ref 12.0–15.0)
MCH: 34.4 pg — ABNORMAL HIGH (ref 26.0–34.0)
MCHC: 34 g/dL (ref 30.0–36.0)
MCV: 101.1 fL — ABNORMAL HIGH (ref 80.0–100.0)
Platelets: 301 K/uL (ref 150–400)
RBC: 2.85 MIL/uL — ABNORMAL LOW (ref 3.87–5.11)
RDW: 14.2 % (ref 11.5–15.5)
WBC: 11.9 K/uL — ABNORMAL HIGH (ref 4.0–10.5)
nRBC: 0 % (ref 0.0–0.2)

## 2023-11-27 LAB — BASIC METABOLIC PANEL WITH GFR
Anion gap: 6 (ref 5–15)
BUN: 12 mg/dL (ref 8–23)
CO2: 28 mmol/L (ref 22–32)
Calcium: 8.1 mg/dL — ABNORMAL LOW (ref 8.9–10.3)
Chloride: 97 mmol/L — ABNORMAL LOW (ref 98–111)
Creatinine, Ser: 0.66 mg/dL (ref 0.44–1.00)
GFR, Estimated: >60 mL/min (ref >60)
Glucose, Bld: 89 mg/dL (ref 70–99)
Potassium: 4.2 mmol/L (ref 3.5–5.1)
Sodium: 131 mmol/L — ABNORMAL LOW (ref 135–145)

## 2023-11-27 LAB — MAGNESIUM: Magnesium: 1.9 mg/dL (ref 1.7–2.4)

## 2023-11-27 NOTE — Progress Notes (Signed)
 Physical Therapy Treatment Patient Details Name: Kathryn Cuevas MRN: [id number] DOB: [birth year] Today's Date: 11/27/2023   History of Present Illness 89 yo female adm 11/19/23 for LHC with PCI with complication of pericardial effusion and brief arrest. 6/29 new Afib and thoracentesis. 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 Pt states she is alarmed by how weak she is and willing to consider post acute rehab at this time. Pt educated for increased mobility, repeated sit to stands for strengthening and endurance and continued plan for progression. Patient will benefit from continued inpatient follow up therapy, <3 hours/day 92% on 2L at rest with desaturation to 87% with activity requiring 4L to recover at rest. HR 78    If plan is discharge home, recommend the following: A little help with walking and/or transfers;A little help with bathing/dressing/bathroom;Assistance with cooking/housework;Assist for transportation;Help with stairs or ramp for entrance   Can travel by private vehicle     Yes  Equipment Recommendations  None recommended by PT    Recommendations for Other Services       Precautions / Restrictions Precautions Precautions: Fall Recall of Precautions/Restrictions: Impaired Precaution/Restrictions Comments: watch O2 Restrictions Weight Bearing Restrictions Per Provider Order: No     Mobility  Bed Mobility Overal bed mobility: Needs Assistance Bed Mobility: Supine to Sit     Supine to sit: Min assist, Used rails Sit to supine: Min assist, Used rails   General bed mobility comments: HOB 25 degrees with min assist to fully elevate trunk and pivot to left side of bed    Transfers Overall transfer level: Needs  assistance   Transfers: Sit to/from Stand Sit to Stand: Contact guard assist           General transfer comment: CGA to rise from bed and repeated 5 sit to stands from recliner with cues for hand placement, controlled descent and safety    Ambulation/Gait Ambulation/Gait assistance: Contact guard assist Gait Distance (Feet): 30 Feet Assistive device: Rolling walker (2 wheels) Gait Pattern/deviations: Step-through pattern, Decreased stride length, Shuffle, Trunk flexed   Gait velocity interpretation: <1.8 ft/sec, indicate of risk for recurrent falls   General Gait Details: cues for posture and proximity to RW, encouragement to maximize distance with pt limited by fatigue. SPO2 drop to 87% on 3L with limited gait   Stairs             Wheelchair Mobility     Tilt Bed    Modified Rankin (Stroke Patients Only)       Balance Overall balance assessment: Needs assistance Sitting-balance support: Feet supported, Single extremity supported Sitting balance-Leahy Scale: Fair Sitting balance - Comments: supervision for safety   Standing balance support: Reliant on assistive device for balance, During functional activity, Bilateral upper extremity supported Standing balance-Leahy Scale: Poor Standing balance comment: RW in standing                            Communication Communication Communication: Impaired Factors Affecting Communication: Hearing impaired  Cognition Arousal: Alert Behavior During Therapy: WFL for tasks assessed/performed   PT - Cognitive impairments: No family/caregiver present to determine baseline, Attention, Sequencing, Problem solving, Safety/Judgement  PT - Cognition Comments: decreased vision, decreased awareness of deficits, cues to initiate Following commands: Intact      Cueing Cueing Techniques: Verbal cues, Gestural cues  Exercises      General Comments        Pertinent Vitals/Pain  Pain Assessment Pain Assessment: No/denies pain    Home Living                          Prior Function            PT Goals (current goals can now be found in the care plan section) Progress towards PT goals: Progressing toward goals    Frequency    Min 2X/week      PT Plan      Co-evaluation              AM-PAC PT 6 Clicks Mobility   Outcome Measure  Help needed turning from your back to your side while in a flat bed without using bedrails?: A Little Help needed moving from lying on your back to sitting on the side of a flat bed without using bedrails?: A Little Help needed moving to and from a bed to a chair (including a wheelchair)?: A Little Help needed standing up from a chair using your arms (e.g., wheelchair or bedside chair)?: A Little Help needed to walk in hospital room?: A Lot Help needed climbing 3-5 steps with a railing? : Total 6 Click Score: 15    End of Session Equipment Utilized During Treatment: Oxygen Activity Tolerance: Patient limited by fatigue Patient left: with call bell/phone within reach;with chair alarm set;in chair Nurse Communication: Mobility status PT Visit Diagnosis: Muscle weakness (generalized) (M62.81);Difficulty in walking, not elsewhere classified (R26.2);Other abnormalities of gait and mobility (R26.89);Unsteadiness on feet (R26.81)     Time: 9188-9167 PT Time Calculation (min) (ACUTE ONLY): 21 min  Charges:    $Therapeutic Activity: 8-22 mins PT General Charges $$ ACUTE PT VISIT: 1 Visit                     Lenoard SQUIBB, PT Acute Rehabilitation Services Office: [phone]    Million Maharaj B Christeen Lai 11/27/2023, 10:30 AM

## 2023-11-27 NOTE — Plan of Care (Signed)
  Problem: Cardiovascular: Goal: Ability to achieve and maintain adequate cardiovascular perfusion will improve Outcome: Progressing   Problem: Clinical Measurements: Goal: Cardiovascular complication will be avoided Outcome: Progressing   Problem: Coping: Goal: Level of anxiety will decrease Outcome: Progressing   Problem: Elimination: Goal: Will not experience complications related to bowel motility Outcome: Progressing Goal: Will not experience complications related to urinary retention Outcome: Progressing   Problem: Safety: Goal: Ability to remain free from injury will improve Outcome: Progressing

## 2023-11-27 NOTE — Progress Notes (Signed)
 Occupational Therapy Treatment Patient Details Name: Kathryn Cuevas MRN: [id number] DOB: 08/20/1931 Today's Date: 11/27/2023   History of present illness 89 yo female adm 11/19/23 for LHC with PCI with complication of pericardial effusion and brief arrest. 6/29 new Afib and thoracentesis. 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 Pt able to progress standing ADLs to CGA with use of RW for stability. Able to sustain SpO2 stats >90% of 2L with short room mobility. Pt continues to be limited d/t decreased activity tolerance and balance. Continue to recommend <3 hours of skilled rehab daily to optimize independence levels. Will continue to follow acutely.       If plan is discharge home, recommend the following:  A little help with walking and/or transfers;A little help with bathing/dressing/bathroom   Equipment Recommendations  BSC/3in1    Recommendations for Other Services      Precautions / Restrictions Precautions Precautions: Fall Recall of Precautions/Restrictions: Impaired Precaution/Restrictions Comments: watch O2 Restrictions Weight Bearing Restrictions Per Provider Order: No       Mobility Bed Mobility Overal bed mobility: Needs Assistance Bed Mobility: Supine to Sit, Sit to Supine     Supine to sit: HOB elevated, Used rails, Contact guard Sit to supine: Contact guard assist, HOB elevated, Used rails   General bed mobility comments: CGA, increased time    Transfers Overall transfer level: Needs assistance Equipment used: Rolling walker (2 wheels) Transfers: Sit to/from Stand, Bed to chair/wheelchair/BSC Sit to Stand: Contact guard assist     Step pivot transfers: Contact guard assist     General transfer comment: Slow to come to stand, mobilized to room door and back     Balance Overall balance  assessment: Needs assistance Sitting-balance support: Feet supported, Single extremity supported Sitting balance-Leahy Scale: Fair Sitting balance - Comments: supervision for safety   Standing balance support: Reliant on assistive device for balance, During functional activity, Bilateral upper extremity supported Standing balance-Leahy Scale: Poor Standing balance comment: Reliant on RW       ADL either performed or assessed with clinical judgement   ADL Overall ADL's : Needs assistance/impaired     Lower Body Dressing: Contact guard assist;Sit to/from stand   Toilet Transfer: Contact guard assist;Rolling walker (2 wheels);Ambulation Toilet Transfer Details (indicate cue type and reason): Simulated in room Toileting- Clothing Manipulation and Hygiene: Contact guard assist;Sit to/from stand         General ADL Comments: CGA for balance, activity tolerance limiting pt    Extremity/Trunk Assessment Upper Extremity Assessment Upper Extremity Assessment: Generalized weakness   Lower Extremity Assessment Lower Extremity Assessment: Defer to PT evaluation        Vision   Vision Assessment?: No apparent visual deficits         Communication Communication Communication: Impaired Factors Affecting Communication: Hearing impaired   Cognition Arousal: Alert Behavior During Therapy: WFL for tasks assessed/performed Cognition: No apparent impairments       Following commands: Intact        Cueing   Cueing Techniques: Verbal cues, Gestural cues        General Comments SpO2 maintaining >90 % on 2L with mobility    Pertinent Vitals/ Pain       Pain Assessment Pain Assessment: No/denies pain         Frequency  Min 2X/week        Progress  Toward Goals  OT Goals(current goals can now be found in the care plan section)  Progress towards OT goals: Progressing toward goals  Acute Rehab OT Goals Patient Stated Goal: To improve OT Goal Formulation: With  patient Time For Goal Achievement: 12/08/23 Potential to Achieve Goals: Good ADL Goals Pt Will Perform Grooming: with set-up;sitting Pt Will Perform Upper Body Bathing: with set-up;sitting Pt Will Perform Lower Body Bathing: with min assist;sit to/from stand Pt Will Transfer to Toilet: with contact guard assist;ambulating;bedside commode Additional ADL Goal #1: pt will demostrate 2 energy conservation strategies for adls.  Plan         AM-PAC OT 6 Clicks Daily Activity     Outcome Measure   Help from another person eating meals?: None Help from another person taking care of personal grooming?: None Help from another person toileting, which includes using toliet, bedpan, or urinal?: A Little Help from another person bathing (including washing, rinsing, drying)?: A Little Help from another person to put on and taking off regular upper body clothing?: A Little Help from another person to put on and taking off regular lower body clothing?: A Little 6 Click Score: 20    End of Session Equipment Utilized During Treatment: Gait belt;Rolling walker (2 wheels);Oxygen  OT Visit Diagnosis: Unsteadiness on feet (R26.81);Muscle weakness (generalized) (M62.81)   Activity Tolerance Patient tolerated treatment well   Patient Left in bed;with call bell/phone within reach;with bed alarm set   Nurse Communication Mobility status;Precautions        Time: 8474-8456 OT Time Calculation (min): 18 min  Charges: OT General Charges $OT Visit: 1 Visit OT Treatments $Self Care/Home Management : 8-22 mins  Adrianne BROCKS, OT  Acute Rehabilitation Services Office [phone] Secure chat preferred   Adrianne GORMAN Savers 11/27/2023, 4:11 PM

## 2023-11-27 NOTE — TOC Progression Note (Signed)
 Transition of Care [facility]) - Progression Note    Patient Details  Name: Kathryn Cuevas MRN: [id number] Date of Birth: 07-02-1931  Transition of Care [facility]) CM/SW Contact  Montie LOISE Louder, [place] Phone Number: 11/27/2023, 1:18 PM  Clinical Narrative:     Spoke with patient and her guardian, Kathryn Cuevas  (by phone)- provided bed offers. She wants to continue to try [facility]- she will review other SNF offers for next choice.   TOC will continue to follow and assist with discharge planning.   Montie Louder, MSW, LCSW Clinical Social Worker    Expected Discharge Plan: Skilled Nursing Facility Barriers to Discharge: Continued Medical Work up  Expected Discharge Plan and Services   Discharge Planning Services: CM Consult   Living arrangements for the past 2 months: Single Family Home                                       Social Determinants of Health (SDOH) Interventions SDOH Screenings   Food Insecurity: Patient Unable To Answer (11/21/2023)  Housing: Patient Unable To Answer (11/21/2023)  Transportation Needs: Patient Unable To Answer (11/21/2023)  Utilities: Patient Unable To Answer (11/21/2023)  Social Connections: Patient Unable To Answer (11/21/2023)  Recent Concern: Social Connections - Socially Isolated (10/28/2023)  Tobacco Use: High Risk (11/25/2023)    Readmission Risk Interventions    10/29/2023    2:00 PM  Readmission Risk Prevention Plan  Post Dischage Appt Complete  Medication Screening Complete  Transportation Screening Complete

## 2023-11-27 NOTE — TOC Progression Note (Addendum)
 Transition of Care [facility]) - Progression Note    Patient Details  Name: Kathryn Cuevas MRN: [id number] Date of Birth: 1931-09-23  Transition of Care [facility]) CM/SW Contact  Montie LOISE Louder, [place] Phone Number: 11/27/2023, 12:54 PM  Clinical Narrative:     Sent message to [facility]- waiting on response  Sent referral to [name] as another option in the [place] area, if [facility] does not have availability.   TOC will continue to follow and assist with discharge planning.  Montie Louder, MSW, LCSW Clinical Social Worker    Expected Discharge Plan: Skilled Nursing Facility Barriers to Discharge: Continued Medical Work up  Expected Discharge Plan and Services   Discharge Planning Services: CM Consult   Living arrangements for the past 2 months: Single Family Home                                       Social Determinants of Health (SDOH) Interventions SDOH Screenings   Food Insecurity: Patient Unable To Answer (11/21/2023)  Housing: Patient Unable To Answer (11/21/2023)  Transportation Needs: Patient Unable To Answer (11/21/2023)  Utilities: Patient Unable To Answer (11/21/2023)  Social Connections: Patient Unable To Answer (11/21/2023)  Recent Concern: Social Connections - Socially Isolated (10/28/2023)  Tobacco Use: High Risk (11/25/2023)    Readmission Risk Interventions    10/29/2023    2:00 PM  Readmission Risk Prevention Plan  Post Dischage Appt Complete  Medication Screening Complete  Transportation Screening Complete

## 2023-11-27 NOTE — Progress Notes (Signed)
  Progress Note  Patient Name: Kathryn Cuevas Date of Encounter: 11/27/2023 [facility] Cardiologist: Alm Clay, MD   Interval Summary   Patient denies any chest pain or SOB. Feels OK  Vital Signs Vitals:   11/26/23 1115 11/26/23 1918 11/26/23 2343 11/27/23 0425  BP: (!) 124/52 (!) 122/57 112/64 (!) 112/48  Pulse: 66 69 63 64  Resp: 19 19 17 20   Temp: 97.7 F (36.5 C) 97.8 F (36.6 C) 97.6 F (36.4 C) 97.9 F (36.6 C)  TempSrc: Oral Oral Oral Axillary  SpO2: 91% 91% 93% 92%  Weight:    47.9 kg  Height:       Intake/Output Summary (Last 24 hours) at 11/27/2023 0728 Last data filed at 11/27/2023 0431 Gross per 24 hour  Intake 300 ml  Output 450 ml  Net -150 ml      11/27/2023    4:25 AM 11/26/2023    3:41 AM 11/25/2023    1:00 PM  Last 3 Weights  Weight (lbs) 105 lb 9.6 oz 97 lb 97 lb 14.2 oz  Weight (kg) 47.9 kg 44 kg 44.4 kg     Telemetry/ECG  Sinus rhythm, HR 60-70s - one 20 beat run of SVT at 12:13 yesterday. No other arrhythmia.  Personally Reviewed  Physical Exam  GEN: No acute distress, on supplemental oxygen.   Neck: No JVD Cardiac: RRR, no murmurs, rubs, or gallops.  Respiratory: diminished breath sounds. GI: Soft, nontender, non-distended  MS: No edema  Assessment & Plan  CHARM STENNER is a 89 y.o. female with recent NSTEMI, HLD and hypothyroid. Admitted post PCI post complication   CAD s/p PCI to ostial RCA  Post PCI perforation Pericardial effusion, tamponade, brief cardiac arrest  LHC showed: mid Cx 30% stenosed, prox RCA 80% stenosed, 2nd Diag 70% stenosed. Previously treated mid LAD and prox LAD. DES placed in prox RCA  During procedure side branch of mid RCA was perforated  Resulting in pericardial effusion > tamponade Pericardial drain was placed during procedure, removed 6/29 EF post intervention recovered to 45-50% Repeat echo 6/29 showed EF 70%, no pericardial effusion  Transferred out of ICU on 7/3 No longer requiring  pressors Continue ASA + Plavix  Continue Lipitor  80 mg daily   Brief episode of atrial fibrillation with RVR Experienced episode of A. Fib with RVR 6/29, lasting around 2 hours No noted recurrence of A. Fib since that event One brief run of SVT yesterday Not currently on anticoagulation Continue amiodarone  200 mg BID  Consider long term AC if patient experienced another episode of A. Fib   Hypoxic respiratory failure  Pleural effusion s/p thoracentesis  Has persistent oxygen requirement  Thought to be related to COPD, but denies oxygen at baseline  Given IV Lasix , diuresed well, CVP 5 yesterday, Lasix  was stopped  Will need oxygen at discharge   Disposition: Plan SNF placement.    For questions or updates, please contact [facility] Please consult [email or web address] for contact info under       Signed, Taylan Marez [place], MD

## 2023-11-28 LAB — BASIC METABOLIC PANEL WITH GFR
Anion gap: 9 (ref 5–15)
BUN: 14 mg/dL (ref 8–23)
CO2: 25 mmol/L (ref 22–32)
Calcium: 8.2 mg/dL — ABNORMAL LOW (ref 8.9–10.3)
Chloride: 96 mmol/L — ABNORMAL LOW (ref 98–111)
Creatinine, Ser: 0.72 mg/dL (ref 0.44–1.00)
GFR, Estimated: >60 mL/min (ref >60)
Glucose, Bld: 88 mg/dL (ref 70–99)
Potassium: 4.1 mmol/L (ref 3.5–5.1)
Sodium: 130 mmol/L — ABNORMAL LOW (ref 135–145)

## 2023-11-28 LAB — CBC
HCT: 30.6 % — ABNORMAL LOW (ref 36.0–46.0)
Hemoglobin: 10.5 g/dL — ABNORMAL LOW (ref 12.0–15.0)
MCH: 34.8 pg — ABNORMAL HIGH (ref 26.0–34.0)
MCHC: 34.3 g/dL (ref 30.0–36.0)
MCV: 101.3 fL — ABNORMAL HIGH (ref 80.0–100.0)
Platelets: 312 K/uL (ref 150–400)
RBC: 3.02 MIL/uL — ABNORMAL LOW (ref 3.87–5.11)
RDW: 14.4 % (ref 11.5–15.5)
WBC: 13 K/uL — ABNORMAL HIGH (ref 4.0–10.5)
nRBC: 0 % (ref 0.0–0.2)

## 2023-11-28 LAB — MAGNESIUM: Magnesium: 1.9 mg/dL (ref 1.7–2.4)

## 2023-11-28 NOTE — Plan of Care (Signed)
  Problem: Cardiovascular: Goal: Ability to achieve and maintain adequate cardiovascular perfusion will improve Outcome: Progressing   Problem: Clinical Measurements: Goal: Will remain free from infection Outcome: Progressing   Problem: Elimination: Goal: Will not experience complications related to bowel motility Outcome: Progressing Goal: Will not experience complications related to urinary retention Outcome: Progressing   Problem: Safety: Goal: Ability to remain free from injury will improve Outcome: Progressing   Problem: Skin Integrity: Goal: Risk for impaired skin integrity will decrease Outcome: Progressing

## 2023-11-28 NOTE — Progress Notes (Signed)
  Progress Note  Patient Name: Kathryn Cuevas Date of Encounter: 11/28/2023 [facility] Cardiologist: Alm Clay, MD   Interval Summary   Cr 0.72, Hgb 10.5, BP 122/51.  Denies any chest pain or dyspnea  Vital Signs Vitals:   11/28/23 0030 11/28/23 0324 11/28/23 0450 11/28/23 0725  BP: 117/61 (!) 115/59  (!) 122/51  Pulse: 61 61  61  Resp: 18 16  18   Temp: 97.9 F (36.6 C) (!) 97.4 F (36.3 C)  (!) 97.3 F (36.3 C)  TempSrc: Oral Oral  Oral  SpO2: 92% 91%  92%  Weight:   47.5 kg   Height:       Intake/Output Summary (Last 24 hours) at 11/28/2023 0901 Last data filed at 11/28/2023 0450 Gross per 24 hour  Intake 460 ml  Output 1600 ml  Net -1140 ml      11/28/2023    4:50 AM 11/27/2023    4:25 AM 11/26/2023    3:41 AM  Last 3 Weights  Weight (lbs) 104 lb 11.5 oz 105 lb 9.6 oz 97 lb  Weight (kg) 47.5 kg 47.9 kg 44 kg     Telemetry/ECG  Sinus rhythm, HR 60-70s - one 20 beat run of SVT at 12:13 yesterday. No other arrhythmia.  Personally Reviewed  Physical Exam  GEN: No acute distress, on supplemental oxygen.   Neck: No JVD Cardiac: RRR, no murmurs, rubs, or gallops.  Respiratory: diminished breath sounds. GI: Soft, nontender, non-distended  MS: No edema  Assessment & Plan  Kathryn Cuevas is a 89 y.o. female with recent NSTEMI, HLD and hypothyroid. Admitted post PCI post complication   CAD s/p PCI to ostial RCA  Post PCI perforation Pericardial effusion, tamponade, brief cardiac arrest  LHC showed: mid Cx 30% stenosed, prox RCA 80% stenosed, 2nd Diag 70% stenosed. Previously treated mid LAD and prox LAD. DES placed in prox RCA  During procedure side branch of mid RCA was perforated  Resulting in pericardial effusion > tamponade Pericardial drain was placed during procedure, removed 6/29 EF post intervention recovered to 45-50% Repeat echo 6/29 showed EF 70%, no pericardial effusion  Transferred out of ICU on 7/3 No longer requiring pressors Continue ASA +  Plavix  Continue Lipitor  80 mg daily   Brief episode of atrial fibrillation with RVR Experienced episode of A. Fib with RVR 6/29, lasting around 2 hours No noted recurrence of A. Fib since that event One brief run of SVT yesterday Not currently on anticoagulation Continue amiodarone  200 mg BID  Consider long term AC if patient experienced another episode of A. Fib   Hypoxic respiratory failure  Pleural effusion s/p thoracentesis  Has persistent oxygen requirement  Thought to be related to COPD, but denies oxygen at baseline  Given IV Lasix , diuresed well,  Lasix  was stopped  Will need oxygen at discharge   Disposition: Plan SNF placement. SW consulted   For questions or updates, please contact [facility] Please consult [email or web address] for contact info under       Signed, Lonni LITTIE Nanas, MD

## 2023-11-29 DIAGNOSIS — J9611 Chronic respiratory failure with hypoxia: Secondary | ICD-10-CM

## 2023-11-29 DIAGNOSIS — J9601 Acute respiratory failure with hypoxia: Secondary | ICD-10-CM

## 2023-11-29 LAB — CBC
HCT: 30.4 % — ABNORMAL LOW (ref 36.0–46.0)
Hemoglobin: 10.5 g/dL — ABNORMAL LOW (ref 12.0–15.0)
MCH: 35.1 pg — ABNORMAL HIGH (ref 26.0–34.0)
MCHC: 34.5 g/dL (ref 30.0–36.0)
MCV: 101.7 fL — ABNORMAL HIGH (ref 80.0–100.0)
Platelets: 373 K/uL (ref 150–400)
RBC: 2.99 MIL/uL — ABNORMAL LOW (ref 3.87–5.11)
RDW: 14.5 % (ref 11.5–15.5)
WBC: 14 K/uL — ABNORMAL HIGH (ref 4.0–10.5)
nRBC: 0 % (ref 0.0–0.2)

## 2023-11-29 LAB — BASIC METABOLIC PANEL WITH GFR
Anion gap: 8 (ref 5–15)
BUN: 15 mg/dL (ref 8–23)
CO2: 25 mmol/L (ref 22–32)
Calcium: 8 mg/dL — ABNORMAL LOW (ref 8.9–10.3)
Chloride: 98 mmol/L (ref 98–111)
Creatinine, Ser: 0.83 mg/dL (ref 0.44–1.00)
GFR, Estimated: >60 mL/min (ref >60)
Glucose, Bld: 89 mg/dL (ref 70–99)
Potassium: 4.2 mmol/L (ref 3.5–5.1)
Sodium: 131 mmol/L — ABNORMAL LOW (ref 135–145)

## 2023-11-29 LAB — MAGNESIUM: Magnesium: 1.8 mg/dL (ref 1.7–2.4)

## 2023-11-29 MED ORDER — FUROSEMIDE 20 MG PO TABS
20.0000 mg | ORAL_TABLET | Freq: Every day | ORAL | Status: DC
  Administered 2023-11-29 – 2023-12-01 (×3): 20 mg via ORAL
  Filled 2023-11-29 (×3): qty 1

## 2023-11-29 MED ORDER — MAGNESIUM OXIDE -MG SUPPLEMENT 400 (240 MG) MG PO TABS
400.0000 mg | ORAL_TABLET | Freq: Once | ORAL | Status: AC
  Administered 2023-11-29: 400 mg via ORAL
  Filled 2023-11-29: qty 1

## 2023-11-29 NOTE — Progress Notes (Signed)
  Progress Note  Patient Name: Kathryn Cuevas Date of Encounter: 11/29/2023 [facility] Cardiologist: Alm Clay, MD   Interval Summary   BP 119/49.  Denies any chest pain or dyspnea  Vital Signs Vitals:   11/28/23 2358 11/29/23 0406 11/29/23 0427 11/29/23 0740  BP: (!) 119/58 (!) 113/59  (!) 119/49  Pulse: 68 65  60  Resp: 19 20 19 13   Temp: 99.4 F (37.4 C) 97.6 F (36.4 C)  (!) 97.4 F (36.3 C)  TempSrc: Axillary Oral  Oral  SpO2: 93% 92%  93%  Weight:   47.9 kg   Height:       Intake/Output Summary (Last 24 hours) at 11/29/2023 0758 Last data filed at 11/29/2023 0408 Gross per 24 hour  Intake 460 ml  Output 1050 ml  Net -590 ml      11/29/2023    4:27 AM 11/28/2023    4:50 AM 11/27/2023    4:25 AM  Last 3 Weights  Weight (lbs) 105 lb 9.6 oz 104 lb 11.5 oz 105 lb 9.6 oz  Weight (kg) 47.9 kg 47.5 kg 47.9 kg     Telemetry/ECG  Sinus rhythm, HR 60-70s - one 20 beat run of SVT at 12:13 yesterday. No other arrhythmia.  Personally Reviewed  Physical Exam  GEN: No acute distress, on supplemental oxygen.   Neck: + JVD Cardiac: RRR, no murmurs, rubs, or gallops.  Respiratory: diminished breath sounds. GI: Soft, nontender, non-distended  MS: No edema  Assessment & Plan  MERAL GEISSINGER is a 89 y.o. female with recent NSTEMI, HLD and hypothyroid. Admitted post PCI post complication   CAD s/p PCI to ostial RCA  Post PCI perforation Pericardial effusion, tamponade, brief cardiac arrest  LHC showed: mid Cx 30% stenosed, prox RCA 80% stenosed, 2nd Diag 70% stenosed. Previously treated mid LAD and prox LAD. DES placed in prox RCA  During procedure side branch of mid RCA was perforated  Resulting in pericardial effusion > tamponade Pericardial drain was placed during procedure, removed 6/29 EF post intervention recovered to 45-50% Repeat echo 6/29 showed EF 70%, no pericardial effusion  Transferred out of ICU on 7/3 No longer requiring pressors Continue ASA +  Plavix  Continue Lipitor  80 mg daily   Brief episode of atrial fibrillation with RVR Experienced episode of A. Fib with RVR 6/29, lasting around 2 hours No noted recurrence of A. Fib since that event One brief run of SVT yesterday Not currently on anticoagulation Continue amiodarone  200 mg BID  Consider long term AC if patient experienced another episode of A. Fib   Hypoxic respiratory failure  Pleural effusion s/p thoracentesis  Has persistent oxygen requirement  Thought to be related to COPD, but denies oxygen at baseline  Given IV Lasix , diuresed well,  IV Lasix  was stopped  Will need oxygen at discharge  Mild JVD on exam, will start PO lasix  20 mg daily  Disposition: Awaiting SNF placement. SW consulted   For questions or updates, please contact [facility] Please consult [email or web address] for contact info under       Signed, Lonni LITTIE Nanas, MD

## 2023-11-29 NOTE — Plan of Care (Signed)
  Problem: Education: Goal: Understanding of CV disease, CV risk reduction, and recovery process will improve Outcome: Progressing   Problem: Activity: Goal: Ability to return to baseline activity level will improve Outcome: Progressing   Problem: Clinical Measurements: Goal: Respiratory complications will improve Outcome: Progressing

## 2023-11-29 NOTE — Plan of Care (Signed)
  Problem: Activity: Goal: Ability to return to baseline activity level will improve Outcome: Progressing   Problem: Cardiovascular: Goal: Ability to achieve and maintain adequate cardiovascular perfusion will improve Outcome: Progressing   Problem: Nutrition: Goal: Adequate nutrition will be maintained Outcome: Progressing   Problem: Skin Integrity: Goal: Risk for impaired skin integrity will decrease Outcome: Progressing

## 2023-11-29 NOTE — Plan of Care (Signed)
  Problem: Cardiovascular: Goal: Ability to achieve and maintain adequate cardiovascular perfusion will improve Outcome: Progressing   Problem: Clinical Measurements: Goal: Respiratory complications will improve Outcome: Progressing Goal: Cardiovascular complication will be avoided Outcome: Progressing   Problem: Coping: Goal: Level of anxiety will decrease Outcome: Progressing   Problem: Elimination: Goal: Will not experience complications related to bowel motility Outcome: Progressing Goal: Will not experience complications related to urinary retention Outcome: Progressing

## 2023-11-29 NOTE — TOC Progression Note (Addendum)
 Transition of Care [facility]) - Progression Note    Patient Details  Name: NIKIRA KUSHNIR MRN: [id number] Date of Birth: 01/04/1932  [facility]) CM/SW Contact  Isaiah Public, LCSWA Phone Number: 11/29/2023, 12:47 PM  Clinical Narrative:     CSW sent message to Whitney with [facility] to see if they can offer SNF bed for patient. CSW awaiting to hear back.  CSW will continue to follow.  Expected Discharge Plan: Skilled Nursing Facility Barriers to Discharge: Continued Medical Work up  Expected Discharge Plan and Services   Discharge Planning Services: CM Consult   Living arrangements for the past 2 months: Single Family Home                                       Social Determinants of Health (SDOH) Interventions SDOH Screenings   Food Insecurity: Patient Unable To Answer (11/21/2023)  Housing: Patient Unable To Answer (11/21/2023)  Transportation Needs: Patient Unable To Answer (11/21/2023)  Utilities: Patient Unable To Answer (11/21/2023)  Social Connections: Patient Unable To Answer (11/21/2023)  Recent Concern: Social Connections - Socially Isolated (10/28/2023)  Tobacco Use: High Risk (11/25/2023)    Readmission Risk Interventions    10/29/2023    2:00 PM  Readmission Risk Prevention Plan  Post Dischage Appt Complete  Medication Screening Complete  Transportation Screening Complete

## 2023-11-30 LAB — BASIC METABOLIC PANEL WITH GFR
Anion gap: 5 (ref 5–15)
BUN: 16 mg/dL (ref 8–23)
CO2: 27 mmol/L (ref 22–32)
Calcium: 7.7 mg/dL — ABNORMAL LOW (ref 8.9–10.3)
Chloride: 98 mmol/L (ref 98–111)
Creatinine, Ser: 0.81 mg/dL (ref 0.44–1.00)
GFR, Estimated: >60 mL/min (ref >60)
Glucose, Bld: 99 mg/dL (ref 70–99)
Potassium: 4.3 mmol/L (ref 3.5–5.1)
Sodium: 130 mmol/L — ABNORMAL LOW (ref 135–145)

## 2023-11-30 LAB — CBC
HCT: 31.1 % — ABNORMAL LOW (ref 36.0–46.0)
Hemoglobin: 10.1 g/dL — ABNORMAL LOW (ref 12.0–15.0)
MCH: 33.2 pg (ref 26.0–34.0)
MCHC: 32.5 g/dL (ref 30.0–36.0)
MCV: 102.3 fL — ABNORMAL HIGH (ref 80.0–100.0)
Platelets: 429 K/uL — ABNORMAL HIGH (ref 150–400)
RBC: 3.04 MIL/uL — ABNORMAL LOW (ref 3.87–5.11)
RDW: 14.6 % (ref 11.5–15.5)
WBC: 14.5 K/uL — ABNORMAL HIGH (ref 4.0–10.5)
nRBC: 0 % (ref 0.0–0.2)

## 2023-11-30 LAB — MAGNESIUM: Magnesium: 2 mg/dL (ref 1.7–2.4)

## 2023-11-30 MED ORDER — AMIODARONE HCL 200 MG PO TABS
200.0000 mg | ORAL_TABLET | Freq: Two times a day (BID) | ORAL | Status: AC
  Administered 2023-11-30 (×2): 200 mg via ORAL
  Filled 2023-11-30: qty 1

## 2023-11-30 MED ORDER — AMIODARONE HCL 200 MG PO TABS
200.0000 mg | ORAL_TABLET | Freq: Every day | ORAL | Status: DC
  Administered 2023-12-01: 200 mg via ORAL
  Filled 2023-11-30: qty 1

## 2023-11-30 NOTE — Progress Notes (Signed)
  Progress Note  Patient Name: Kathryn Cuevas Date of Encounter: 11/30/2023 [facility] Cardiologist: Alm Clay, MD   Interval Summary   BP stable.  Denies any chest pain or dyspnea  Vital Signs Vitals:   11/29/23 2345 11/30/23 0335 11/30/23 0600 11/30/23 0715  BP:  (!) 112/58  (!) 117/57  Pulse:    (!) 58  Resp:    18  Temp: (!) 97.5 F (36.4 C) (!) 97.5 F (36.4 C)  97.8 F (36.6 C)  TempSrc: Axillary Axillary  Oral  SpO2:    94%  Weight:   49.9 kg   Height:       Intake/Output Summary (Last 24 hours) at 11/30/2023 0907 Last data filed at 11/29/2023 2000 Gross per 24 hour  Intake 240 ml  Output --  Net 240 ml      11/30/2023    6:00 AM 11/29/2023    4:27 AM 11/28/2023    4:50 AM  Last 3 Weights  Weight (lbs) 110 lb 0.2 oz 105 lb 9.6 oz 104 lb 11.5 oz  Weight (kg) 49.9 kg 47.9 kg 47.5 kg     Telemetry/ECG  Sinus rhythm, HR 60-70s Personally Reviewed  Physical Exam  GEN: No acute distress, on supplemental oxygen.   Neck: + JVD Cardiac: RRR, no murmurs, rubs, or gallops.  Respiratory: diminished breath sounds. GI: Soft, nontender, non-distended  MS: No edema  Assessment & Plan  Kathryn Cuevas is a 89 y.o. female with recent NSTEMI, HLD and hypothyroid. Admitted post PCI post complication   CAD s/p PCI to ostial RCA  Post PCI perforation Pericardial effusion, tamponade, brief cardiac arrest  LHC showed: mid Cx 30% stenosed, prox RCA 80% stenosed, 2nd Diag 70% stenosed. Previously treated mid LAD and prox LAD. DES placed in prox RCA  During procedure side branch of mid RCA was perforated  Resulting in pericardial effusion > tamponade Pericardial drain was placed during procedure, removed 6/29 EF post intervention recovered to 45-50% Repeat echo 6/29 showed EF 70%, no pericardial effusion  Transferred out of ICU on 7/3 No longer requiring pressors Continue ASA + Plavix  Continue Lipitor  80 mg daily   Brief episode of atrial fibrillation with  RVR Experienced episode of A. Fib with RVR 6/29, lasting around 2 hours No noted recurrence of A. Fib since that event One brief run of SVT 7/5 Not currently on anticoagulation Continue amiodarone  200 mg BID today, transition to 200 mg daily tomorrow. Can consider stopping as outpatient if no recurrence after short course of 2-3 mo.  Consider long term AC if patient experienced another episode of A. Fib   Hypoxic respiratory failure  Pleural effusion s/p thoracentesis  Has persistent oxygen requirement  Thought to be related to COPD, but denies oxygen at baseline  Given IV Lasix , diuresed well,  IV Lasix  was stopped  Will need oxygen at discharge  Mild JVD on exam,  PO lasix  20 mg daily Net negative 3.3 L for admission  Disposition: Awaiting SNF placement. SW consulted   For questions or updates, please contact [facility] Please consult [email or web address] for contact info under       Signed, Magon Croson A Bellamy Judson, MD

## 2023-11-30 NOTE — TOC Progression Note (Signed)
 Transition of Care [facility]) - Progression Note    Patient Details  Name: ISLA SABREE MRN: [id number] Date of Birth: 1931/11/13  [facility]) CM/SW Contact  Luise JAYSON Pan, LCSWA Phone Number: 11/30/2023, 3:50 PM  Clinical Narrative:  [facility] can accept patient but facility has a $46/day fee. CSW informed [name] and Patient of this fee. Patient does not want to pay and would rather go to [facility]. [name] been notified.   TOC will continue to follow.    Expected Discharge Plan: Skilled Nursing Facility Barriers to Discharge: Continued Medical Work up  Expected Discharge Plan and Services   Discharge Planning Services: CM Consult   Living arrangements for the past 2 months: Single Family Home                                       Social Determinants of Health (SDOH) Interventions SDOH Screenings   Food Insecurity: Patient Unable To Answer (11/21/2023)  Housing: Patient Unable To Answer (11/21/2023)  Transportation Needs: Patient Unable To Answer (11/21/2023)  Utilities: Patient Unable To Answer (11/21/2023)  Social Connections: Patient Unable To Answer (11/21/2023)  Recent Concern: Social Connections - Socially Isolated (10/28/2023)  Tobacco Use: High Risk (11/25/2023)    Readmission Risk Interventions    10/29/2023    2:00 PM  Readmission Risk Prevention Plan  Post Dischage Appt Complete  Medication Screening Complete  Transportation Screening Complete

## 2023-11-30 NOTE — Plan of Care (Signed)
   Problem: Education: Goal: Knowledge of General Education information will improve Description: Including pain rating scale, medication(s)/side effects and non-pharmacologic comfort measures Outcome: Progressing   Problem: Clinical Measurements: Goal: Ability to maintain clinical measurements within normal limits will improve Outcome: Progressing   Problem: Nutrition: Goal: Adequate nutrition will be maintained Outcome: Progressing

## 2023-12-01 DIAGNOSIS — M6281 Muscle weakness (generalized): Secondary | ICD-10-CM | POA: Diagnosis not present

## 2023-12-01 DIAGNOSIS — Z9981 Dependence on supplemental oxygen: Secondary | ICD-10-CM | POA: Diagnosis not present

## 2023-12-01 DIAGNOSIS — I214 Non-ST elevation (NSTEMI) myocardial infarction: Secondary | ICD-10-CM | POA: Diagnosis not present

## 2023-12-01 DIAGNOSIS — J918 Pleural effusion in other conditions classified elsewhere: Secondary | ICD-10-CM | POA: Diagnosis not present

## 2023-12-01 DIAGNOSIS — I071 Rheumatic tricuspid insufficiency: Secondary | ICD-10-CM | POA: Diagnosis not present

## 2023-12-01 DIAGNOSIS — M6250 Muscle wasting and atrophy, not elsewhere classified, unspecified site: Secondary | ICD-10-CM | POA: Diagnosis not present

## 2023-12-01 DIAGNOSIS — H47011 Ischemic optic neuropathy, right eye: Secondary | ICD-10-CM | POA: Diagnosis not present

## 2023-12-01 DIAGNOSIS — R7303 Prediabetes: Secondary | ICD-10-CM | POA: Diagnosis not present

## 2023-12-01 DIAGNOSIS — M160 Bilateral primary osteoarthritis of hip: Secondary | ICD-10-CM | POA: Diagnosis not present

## 2023-12-01 DIAGNOSIS — E785 Hyperlipidemia, unspecified: Secondary | ICD-10-CM | POA: Diagnosis not present

## 2023-12-01 DIAGNOSIS — J449 Chronic obstructive pulmonary disease, unspecified: Secondary | ICD-10-CM | POA: Diagnosis not present

## 2023-12-01 DIAGNOSIS — I25118 Atherosclerotic heart disease of native coronary artery with other forms of angina pectoris: Secondary | ICD-10-CM | POA: Diagnosis not present

## 2023-12-01 DIAGNOSIS — D539 Nutritional anemia, unspecified: Secondary | ICD-10-CM | POA: Diagnosis not present

## 2023-12-01 DIAGNOSIS — K5900 Constipation, unspecified: Secondary | ICD-10-CM | POA: Diagnosis not present

## 2023-12-01 DIAGNOSIS — D531 Other megaloblastic anemias, not elsewhere classified: Secondary | ICD-10-CM | POA: Diagnosis not present

## 2023-12-01 DIAGNOSIS — R197 Diarrhea, unspecified: Secondary | ICD-10-CM | POA: Diagnosis not present

## 2023-12-01 DIAGNOSIS — R918 Other nonspecific abnormal finding of lung field: Secondary | ICD-10-CM | POA: Diagnosis not present

## 2023-12-01 DIAGNOSIS — R634 Abnormal weight loss: Secondary | ICD-10-CM | POA: Diagnosis not present

## 2023-12-01 DIAGNOSIS — I480 Paroxysmal atrial fibrillation: Secondary | ICD-10-CM | POA: Diagnosis not present

## 2023-12-01 DIAGNOSIS — R41841 Cognitive communication deficit: Secondary | ICD-10-CM | POA: Diagnosis not present

## 2023-12-01 DIAGNOSIS — R079 Chest pain, unspecified: Secondary | ICD-10-CM | POA: Diagnosis not present

## 2023-12-01 DIAGNOSIS — R1312 Dysphagia, oropharyngeal phase: Secondary | ICD-10-CM | POA: Diagnosis not present

## 2023-12-01 DIAGNOSIS — Z955 Presence of coronary angioplasty implant and graft: Secondary | ICD-10-CM | POA: Diagnosis not present

## 2023-12-01 DIAGNOSIS — J309 Allergic rhinitis, unspecified: Secondary | ICD-10-CM | POA: Diagnosis not present

## 2023-12-01 DIAGNOSIS — D72829 Elevated white blood cell count, unspecified: Secondary | ICD-10-CM | POA: Diagnosis not present

## 2023-12-01 DIAGNOSIS — I209 Angina pectoris, unspecified: Secondary | ICD-10-CM | POA: Diagnosis not present

## 2023-12-01 DIAGNOSIS — I249 Acute ischemic heart disease, unspecified: Secondary | ICD-10-CM | POA: Diagnosis not present

## 2023-12-01 DIAGNOSIS — Z7901 Long term (current) use of anticoagulants: Secondary | ICD-10-CM | POA: Diagnosis not present

## 2023-12-01 DIAGNOSIS — D7589 Other specified diseases of blood and blood-forming organs: Secondary | ICD-10-CM | POA: Diagnosis not present

## 2023-12-01 DIAGNOSIS — I10 Essential (primary) hypertension: Secondary | ICD-10-CM | POA: Diagnosis not present

## 2023-12-01 DIAGNOSIS — E782 Mixed hyperlipidemia: Secondary | ICD-10-CM | POA: Diagnosis not present

## 2023-12-01 DIAGNOSIS — M5416 Radiculopathy, lumbar region: Secondary | ICD-10-CM | POA: Diagnosis not present

## 2023-12-01 DIAGNOSIS — Z7401 Bed confinement status: Secondary | ICD-10-CM | POA: Diagnosis not present

## 2023-12-01 DIAGNOSIS — J189 Pneumonia, unspecified organism: Secondary | ICD-10-CM | POA: Diagnosis not present

## 2023-12-01 DIAGNOSIS — I468 Cardiac arrest due to other underlying condition: Secondary | ICD-10-CM | POA: Diagnosis not present

## 2023-12-01 DIAGNOSIS — Z720 Tobacco use: Secondary | ICD-10-CM | POA: Diagnosis not present

## 2023-12-01 DIAGNOSIS — J209 Acute bronchitis, unspecified: Secondary | ICD-10-CM | POA: Diagnosis not present

## 2023-12-01 DIAGNOSIS — I3139 Other pericardial effusion (noninflammatory): Secondary | ICD-10-CM | POA: Diagnosis not present

## 2023-12-01 DIAGNOSIS — Z23 Encounter for immunization: Secondary | ICD-10-CM | POA: Diagnosis not present

## 2023-12-01 DIAGNOSIS — I2510 Atherosclerotic heart disease of native coronary artery without angina pectoris: Secondary | ICD-10-CM | POA: Diagnosis not present

## 2023-12-01 DIAGNOSIS — I314 Cardiac tamponade: Secondary | ICD-10-CM | POA: Diagnosis not present

## 2023-12-01 DIAGNOSIS — Z48812 Encounter for surgical aftercare following surgery on the circulatory system: Secondary | ICD-10-CM | POA: Diagnosis not present

## 2023-12-01 DIAGNOSIS — E039 Hypothyroidism, unspecified: Secondary | ICD-10-CM | POA: Diagnosis not present

## 2023-12-01 DIAGNOSIS — J9601 Acute respiratory failure with hypoxia: Secondary | ICD-10-CM | POA: Diagnosis not present

## 2023-12-01 DIAGNOSIS — R2689 Other abnormalities of gait and mobility: Secondary | ICD-10-CM | POA: Diagnosis not present

## 2023-12-01 DIAGNOSIS — R531 Weakness: Secondary | ICD-10-CM | POA: Diagnosis not present

## 2023-12-01 DIAGNOSIS — Z28311 Partially vaccinated for covid-19: Secondary | ICD-10-CM | POA: Diagnosis not present

## 2023-12-01 DIAGNOSIS — E8881 Metabolic syndrome: Secondary | ICD-10-CM | POA: Diagnosis not present

## 2023-12-01 LAB — IRON AND TIBC
Iron: 20 ug/dL — ABNORMAL LOW (ref 28–170)
Saturation Ratios: 7 % — ABNORMAL LOW (ref 10.4–31.8)
TIBC: 277 ug/dL (ref 250–450)
UIBC: 257 ug/dL

## 2023-12-01 LAB — CBC
HCT: 30.2 % — ABNORMAL LOW (ref 36.0–46.0)
Hemoglobin: 10 g/dL — ABNORMAL LOW (ref 12.0–15.0)
MCH: 34 pg (ref 26.0–34.0)
MCHC: 33.1 g/dL (ref 30.0–36.0)
MCV: 102.7 fL — ABNORMAL HIGH (ref 80.0–100.0)
Platelets: 452 K/uL — ABNORMAL HIGH (ref 150–400)
RBC: 2.94 MIL/uL — ABNORMAL LOW (ref 3.87–5.11)
RDW: 14.6 % (ref 11.5–15.5)
WBC: 16.5 K/uL — ABNORMAL HIGH (ref 4.0–10.5)
nRBC: 0 % (ref 0.0–0.2)

## 2023-12-01 LAB — BASIC METABOLIC PANEL WITH GFR
Anion gap: 9 (ref 5–15)
BUN: 17 mg/dL (ref 8–23)
CO2: 24 mmol/L (ref 22–32)
Calcium: 7.9 mg/dL — ABNORMAL LOW (ref 8.9–10.3)
Chloride: 97 mmol/L — ABNORMAL LOW (ref 98–111)
Creatinine, Ser: 0.81 mg/dL (ref 0.44–1.00)
GFR, Estimated: >60 mL/min (ref >60)
Glucose, Bld: 109 mg/dL — ABNORMAL HIGH (ref 70–99)
Potassium: 4.1 mmol/L (ref 3.5–5.1)
Sodium: 130 mmol/L — ABNORMAL LOW (ref 135–145)

## 2023-12-01 LAB — MAGNESIUM: Magnesium: 1.9 mg/dL (ref 1.7–2.4)

## 2023-12-01 LAB — TSH: TSH: 11.312 u[IU]/mL — ABNORMAL HIGH (ref 0.350–4.500)

## 2023-12-01 LAB — FERRITIN: Ferritin: 426 ng/mL — ABNORMAL HIGH (ref 11–307)

## 2023-12-01 MED ORDER — ATORVASTATIN CALCIUM 80 MG PO TABS: 80.0000 mg | ORAL_TABLET | Freq: Every day | ORAL | Status: DC

## 2023-12-01 MED ORDER — FUROSEMIDE 20 MG PO TABS: 20.0000 mg | ORAL_TABLET | Freq: Every day | ORAL | Status: DC

## 2023-12-01 MED ORDER — AMIODARONE HCL 200 MG PO TABS: 200.0000 mg | ORAL_TABLET | Freq: Every day | ORAL | Status: DC

## 2023-12-01 NOTE — TOC Transition Note (Signed)
 Transition of Care [facility]) - Discharge Note   Patient Details  Name: Kathryn Cuevas MRN: [id number] Date of Birth: Apr 20, 1932  Transition of Care [facility]) CM/SW Contact:  Luise JAYSON Pan, LCSWA Phone Number: 12/01/2023, 2:29 PM   Clinical Narrative:  [name] Anticipated DC date: 12/01/23  Family notified: [name]  Transport by: [facility]   Per MD patient ready for [facility] to [name]. RN to call report prior to discharge [phone]; room [address] RN, patient, patient's family, and facility notified of DC. Discharge Summary and FL2 sent to facility. DC packet on chart. Ambulance transport requested for patient 2:34 PM.   CSW will sign off for now as social work intervention is no longer needed. Please consult us  again if new needs arise.  Final next level of care: Skilled Nursing Facility Barriers to Discharge: Barriers Resolved   Patient Goals and CMS Choice            Discharge Placement              [name] Patient to be transferred to facility by: [facility] Name of family member notified: [name] (legal guardian) Patient and family notified of of transfer: 12/01/23  Discharge Plan and Services Additional resources added to the After Visit Summary for     Discharge Planning Services: CM Consult                                 Social Drivers of Health (SDOH) Interventions SDOH Screenings   Food Insecurity: Patient Unable To Answer (11/21/2023)  Housing: Patient Unable To Answer (11/21/2023)  Transportation Needs: Patient Unable To Answer (11/21/2023)  Utilities: Patient Unable To Answer (11/21/2023)  Social Connections: Patient Unable To Answer (11/21/2023)  Recent Concern: Social Connections - Socially Isolated (10/28/2023)  Tobacco Use: High Risk (11/25/2023)     Readmission Risk Interventions    10/29/2023    2:00 PM  Readmission Risk Prevention Plan  Post Dischage Appt Complete  Medication Screening  Complete  Transportation Screening Complete

## 2023-12-01 NOTE — Progress Notes (Signed)
  Progress Note  Patient Name: Kathryn Cuevas Date of Encounter: 12/01/2023 [facility] Cardiologist: Alm Clay, MD   Interval Summary   BP stable.  Denies any chest pain or dyspnea  Vital Signs Vitals:   11/30/23 1921 12/01/23 0051 12/01/23 0405 12/01/23 0730  BP: (!) 110/37 (!) 103/52 (!) 111/49 (!) 118/58  Pulse: 60 63 61 62  Resp: 20 20 17 17   Temp:  (!) 97.5 F (36.4 C) 98 F (36.7 C) 97.7 F (36.5 C)  TempSrc:  Oral Oral Oral  SpO2: 93% 91% 90% 92%  Weight:  50.5 kg    Height:       Intake/Output Summary (Last 24 hours) at 12/01/2023 0940 Last data filed at 11/30/2023 1817 Gross per 24 hour  Intake 120 ml  Output 700 ml  Net -580 ml      12/01/2023   12:51 AM 11/30/2023    6:00 AM 11/29/2023    4:27 AM  Last 3 Weights  Weight (lbs) 111 lb 5.3 oz 110 lb 0.2 oz 105 lb 9.6 oz  Weight (kg) 50.5 kg 49.9 kg 47.9 kg     Telemetry/ECG  Sinus rhythm, HR 60-70s Personally Reviewed  Physical Exam  GEN: No acute distress, on supplemental oxygen.   Neck: + JVD Cardiac: RRR, no murmurs, rubs, or gallops.  Respiratory: diminished breath sounds. GI: Soft, nontender, non-distended  MS: No edema  Assessment & Plan  Kathryn Cuevas is a 89 y.o. female with recent NSTEMI, HLD and hypothyroid. Admitted post PCI post complication   CAD s/p PCI to ostial RCA  Post PCI perforation Pericardial effusion, tamponade, brief cardiac arrest  LHC showed: mid Cx 30% stenosed, prox RCA 80% stenosed, 2nd Diag 70% stenosed. Previously treated mid LAD and prox LAD. DES placed in prox RCA  During procedure side branch of mid RCA was perforated  Resulting in pericardial effusion > tamponade Pericardial drain was placed during procedure, removed 6/29 EF post intervention recovered to 45-50% Repeat echo 6/29 showed EF 70%, no pericardial effusion  Transferred out of ICU on 7/3 No longer requiring pressors Continue ASA + Plavix  Continue Lipitor  80 mg daily   Brief episode of atrial  fibrillation with RVR Experienced episode of A. Fib with RVR 6/29, lasting around 2 hours No noted recurrence of A. Fib since that event One brief run of SVT 7/5 Not currently on anticoagulation Continue amiodarone  200 mg daily. Can consider stopping as outpatient if no recurrence after short course of 2-3 mo.  Consider long term AC if patient experiences another episode of A. Fib   Hypoxic respiratory failure  Pleural effusion s/p thoracentesis  Has persistent oxygen requirement  Thought to be related to COPD, but denies oxygen at baseline  Given IV Lasix , diuresed well Will need oxygen at discharge, O2 sats 88-89% on RA.  PO lasix  20 mg daily Net negative 4.2 L for admission  Leukocytosis - mild leukocytosis without fever and with improving respiratory status. Observe.  Anemia - baseline Hgb 14 however has been low since hospitalization but stable. Recheck as outpatient with iron studies. No signs of bleeding.   Disposition: Kathryn Cuevas bed available. DC home today with O2.   F/u scheduled 7/14 with CHRISTELLA Louis NP Order repeat CBC, BMET prior to visit.     For questions or updates, please contact [facility] Please consult [email or web address] for contact info under       Signed, Dishawn Bhargava A Duana Benedict, MD

## 2023-12-01 NOTE — Care Management Important Message (Signed)
 Important Message  Patient Details  Name: Kathryn Cuevas MRN: [id number] Date of Birth: 1931-10-14   Important Message Given:  Yes - Medicare IM     Jon Cruel 12/01/2023, 2:16 PM

## 2023-12-01 NOTE — Progress Notes (Signed)
 Patient saturating above 90%. Discussed O2 with PT, pt was 87% at 1312.  Pt was above 90% on RA from 0730 to 1311.   Returned to RA at 1406.  Pt desaturates to 89% when sleeping noted at 1425.

## 2023-12-01 NOTE — TOC Progression Note (Signed)
 Transition of Care [facility]) - Progression Note    Patient Details  Name: Kathryn Cuevas MRN: [id number] Date of Birth: 10-03-1931  Transition of [facility]) CM/SW Contact  Luise JAYSON Pan, [place] Phone Number: 12/01/2023, 1:34 PM  Clinical Narrative:   CSW notified facility and treatment team that patient is medically stable and [facility] has bed availability.   1:35 PM Awaiting DC orders and Summary.   TOC will continue to follow.    Expected Discharge Plan: Skilled Nursing Facility Barriers to Discharge: Continued Medical Work up  Expected Discharge Plan and Services   Discharge Planning Services: CM Consult   Living arrangements for the past 2 months: Single Family Home                                       Social Determinants of Health (SDOH) Interventions SDOH Screenings   Food Insecurity: Patient Unable To Answer (11/21/2023)  Housing: Patient Unable To Answer (11/21/2023)  Transportation Needs: Patient Unable To Answer (11/21/2023)  Utilities: Patient Unable To Answer (11/21/2023)  Social Connections: Patient Unable To Answer (11/21/2023)  Recent Concern: Social Connections - Socially Isolated (10/28/2023)  Tobacco Use: High Risk (11/25/2023)    Readmission Risk Interventions    10/29/2023    2:00 PM  Readmission Risk Prevention Plan  Post Dischage Appt Complete  Medication Screening Complete  Transportation Screening Complete

## 2023-12-01 NOTE — Plan of Care (Signed)
  Problem: Education: Goal: Understanding of CV disease, CV risk reduction, and recovery process will improve Outcome: Progressing   Problem: Activity: Goal: Ability to return to baseline activity level will improve Outcome: Progressing   Problem: Cardiovascular: Goal: Ability to achieve and maintain adequate cardiovascular perfusion will improve Outcome: Progressing   Problem: Clinical Measurements: Goal: Ability to maintain clinical measurements within normal limits will improve Outcome: Progressing   Problem: Clinical Measurements: Goal: Will remain free from infection Outcome: Progressing   Problem: Clinical Measurements: Goal: Diagnostic test results will improve Outcome: Progressing   Problem: Clinical Measurements: Goal: Respiratory complications will improve Outcome: Progressing   Problem: Clinical Measurements: Goal: Cardiovascular complication will be avoided Outcome: Progressing   Problem: Activity: Goal: Risk for activity intolerance will decrease Outcome: Progressing   Problem: Coping: Goal: Level of anxiety will decrease Outcome: Progressing

## 2023-12-01 NOTE — Discharge Summary (Addendum)
 Discharge Summary   Patient ID: Kathryn Cuevas MRN: [id number]; DOB: 1931-08-24  Admit date: 11/19/2023 Discharge date: 12/01/2023  PCP:  Okey Carlin Redbird, MD   [facility] Providers Cardiologist:  Alm Clay, MD       Discharge Diagnoses  Principal Problem:   Angina pectoris [facility]) Active Problems:   Cardiac tamponade   Pericardial effusion   Coronary artery disease involving native coronary artery of native heart without angina pectoris   S/P coronary artery stent placement   Acute respiratory failure with hypoxia [facility])   Diagnostic Studies/Procedures    Echo from 11/22/2023:  1. No pericardial effusion is present.   2. Left ventricular ejection fraction, by estimation, is 70 to 75%. The  left ventricle has hyperdynamic function. The left ventricle has no  regional wall motion abnormalities. Left ventricular diastolic parameters  are indeterminate.   3. Right ventricular systolic function is mildly reduced. The right  ventricular size is mildly enlarged. There is normal pulmonary artery  systolic pressure. The estimated right ventricular systolic pressure is  28.8 mmHg.   4. Trivial mitral valve regurgitation.   5. Tricuspid valve regurgitation is moderate.   6. The aortic valve is tricuspid. Aortic valve regurgitation is mild.  Aortic valve sclerosis is present, with no evidence of aortic valve  stenosis.   7. The inferior vena cava is dilated in size with >50% respiratory  variability, suggesting right atrial pressure of 8 mmHg.   Comparison(s): No significant change from prior study.    Echo from 11/20/23:  1. Left ventricular ejection fraction, by estimation, is 60 to 65%. The  left ventricle has normal function. The left ventricle has no regional  wall motion abnormalities. There is mild concentric left ventricular  hypertrophy.   2. Right ventricular systolic function is normal. The right ventricular  size is normal. There is normal pulmonary  artery systolic pressure. The  estimated right ventricular systolic pressure is 34.4 mmHg.   3. There is no evidence of cardiac tamponade.   4. Tricuspid valve regurgitation is moderate to severe.   5. The aortic valve is tricuspid. There is mild calcification of the  aortic valve. Aortic valve regurgitation is mild to moderate. Aortic valve  sclerosis/calcification is present, without any evidence of aortic  stenosis.   6. The inferior vena cava is dilated in size with <50% respiratory  variability, suggesting right atrial pressure of 15 mmHg.   Conclusion(s)/Recommendation(s): Pericardial effusion no longer present.    Echo from 11/19/23:  1. Left ventricular ejection fraction, by estimation, is 55 to 60%. The  left ventricle has normal function.   2. Right ventricular systolic function is normal.   3. Small to moderate effusion over RV free wall and apex.   4. There is mild calcification of the aortic valve.   Conclusion(s)/Recommendation(s): Patient currently in cath lab having  effusion drained.    LHC on 11/19/23:     Non-stenotic Prox RCA lesion was previously treated.   Non-stenotic Mid RCA lesion.   Non-stenotic Ost RCA to Prox RCA lesion.   A covered stent was successfully placed using a STENT PK PAPYRUS 4.0X15.   A drug-eluting stent was successfully placed using a STENT SYNERGY XD 4.0X8.   Post intervention, there is a 0% residual stenosis.   Post intervention, there is a 0% residual stenosis.   Recommend uninterrupted dual antiplatelet therapy with Aspirin  81mg  daily and Clopidogrel  75mg  daily for a minimum of 12 months (ACS-Class I recommendation).   Findings  of wire perforation of tiny side branch of mid RCA resulting in pericardial effusion and tamponade Successful pericardiocentesis from subxyphoid approach Successful sealing of perforation by placement of Covered stent in the mid RCA Stenting of the ostial RCA to ensure coverage of focal dissection   Plan:  monitor in ICU. Will leave pericardial drain in place. Repeat Echo in am. Continue DAPT for one year.     Echo from 11/19/23:  1. Left ventricular ejection fraction, by estimation, is 60 to 65%. The  left ventricle has normal function. There is mild concentric left  ventricular hypertrophy.   2. Right ventricular systolic function is normal. The right ventricular  size is normal.   3. Left atrial size was mildly dilated.   4. Trivial pericardial effusion. EF improved since previous    Coronary atherectomy 11/19/23:     Mid Cx lesion is 30% stenosed.   Prox RCA lesion is 80% stenosed.   2nd Diag lesion is 70% stenosed.   Non-stenotic Mid LAD lesion was previously treated.   Non-stenotic Prox LAD lesion was previously treated.   A drug-eluting stent was successfully placed using a STENT SYNERGY XD 4.0X38.   Post intervention, there is a 0% residual stenosis.   Recommend uninterrupted dual antiplatelet therapy with Aspirin  81mg  daily and Clopidogrel  75mg  daily for a minimum of 12 months (ACS-Class I recommendation).   Successful PCI of the proximal RCA with orbital atherectomy and stenting. Procedure was complicated by guide induced perforation and hemodynamic collapse that responded to brief CPR, pressors and balloon sealing of perforation. Successful DES x 1. No Effusion on Echo.     Plan: observe overnight. DAPT for one year. Anticipate DC tomorrow if no further complications.             History of Present Illness    Per office note (interval H&P) by Mr Rana, NP-C on 11/09/23:  Kathryn Cuevas is a 89 y.o. female with visit-pertinent history of hyperlipidemia, hypothyroidism.    Patient was admitted to the hospital 10/27/2023 for evaluation of chest pain.  Patient was not routinely following with cardiology as she had no past cardiac history.  Was seen once by Dr. Anner in 2019, at that time there was concern the patient possibly had a TIA in 10/2016.  She had carotid  ultrasounds in 11/2016 that showed no significant arthrosclerotic plaque bilaterally.  She had an echocardiogram in 09/2017 that showed LVEF of 60 to 65%, no RWMA, no significant valvular abnormalities.  No evidence of PFO or ASD.  Nothing to suggest source of possible TIA, she has not been seen by cardiology since that time.   She had presented to the ED on 10/27/2023 with reports of chest pain.  Troponins 160, 201.  Peak troponin 843.  EKG showed sinus rhythm with PACs, T wave inversions in V2-V6.  General cardiac catheterization with PCI of the proximal to mid LAD with DES x 2.  Was a prolonged difficult procedure due to calcification and difficult access.  She did have significant stenosis in the proximal RCA that was also calcified.  There was discussion of staged PCI.  Patient desires to be discharged however and return later for elective PCI.  She was started on DAPT with aspirin  and Plavix .  She was started on low-dose Toprol -XL.  She was to follow-up in office to discuss staged PCI of the RCA.  Echocardiogram 10/28/2023 with LVEF 45 to 50%, mild LVH, grade 1 DD, mild aortic valve regurgitation.   She  presented to the office on 11/09/23 for follow-up after above cardiac catheterization and stent placement.   She had reported significant improvement with no current chest pressure or pain and can walk without discomfort.  She denied any dyspnea, orthopnea, PND.  She still had some mild fatigue however this was improving.   She was taking rosuvastatin  at night, which she believes is causing severe leg aches, affecting her ability to walk comfortably during the day.  She had not taken in the last 2 days with improvement in her symptoms.   She lives independently, manages her finances, and performs daily activities such as grocery shopping and cooking, though she avoids heavy housework.  She was arranged for staged PC to RCA and presented to [facility] on 11/19/23.    Hospital Course   Consultants: advanced  heart failure team   CAD s/p staged PCI to pRCA with orbital atherectomy and stenting 11/19/23 Post PCI mid RCA wire perforation and resultant cardiac arrest,  pericardial effusion with tamponade -  presented for staged PCI to RCA on 11/19/23, underwent successful PCI of the proximal RCA with orbital atherectomy and stenting. Cath complicated by guide induced perforation and brief cardiac arrest that responded to brief CPR, pressors and balloon sealing of perforation. Post procedure Echo showed trivial pericardial effusion. Repeat LHC 11/19/23 afternoon due to persistent hypotension showed wire perforation of tiny side branch of mid RCA resulting in pericardial effusion and tamponade. She was treated successfully with pericardiocentesis from subxyphoid approach, sealing of perforation by placement of Covered stent in the mid RCA, and stenting of the ostial RCA to ensure coverage of focal dissection. She was transferred to ICU post procedure for monitor with pericardial drain in place. Post procedure Echo showed EF 55-60%, RV normal, small-mod effusion over RV free wall and apex.  -  AHF consulted and following since 11/20/23 during CVICU stay, Echo 11/20/23 showed EF 60-65%, no RWMA, mild concentric LVH, RV normal, PASP 34.4 mmHg,  no evidence of cardiac tamponade, TR mod-severe , mild to mod AI, aortic sclerosis. She required vasopressor support with levophed  and vasopressin . Pericardial drain removed 11/21/23.  She developed new onset A-fib RVR 160-200s on 11/22/2023 morning.  She was started on IV amiodarone  with subsequent conversion to sinus rhythm.  Anticoagulation was not started due to recent pericardial drain and brief nature of A-fib.  She had no recurrence of A-fib and transition to amiodarone  200 mg twice daily.  Long-term anticoagulation was felt indicated if patient has recurrent A-fib in the future.  She was given broad-spectrum antibiotic due to leukocytosis for 5 days with no clear source of infection  found, blood culture 11/22/23 NTD.  Echo from 11/22/23 showed no pericardial effusion, LVEF 70 to 75%, no regional motion abnormality, indeterminate diastolic parameter, mildly reduced RV, normal PASP, trivial MR, moderate TR, mild AI.  Pleural effusion with noted on Echo and she underwent left-sided thoracentesis on 11/22/2023 by ICU team with 250 mL of blood-tinged appearing fluid drained.  She had persistent hypoxia where IV Lasix  was given for fluid overload with 2.5 L output, IV Lasix  was stopped on 11/24/2023.  She was weaned off vasopressors on 11/25/2023.  She was transferred to the floor under cardiology service on 11/26/2023.  - During floor stay, she had persistent oxygen requirement, felt this is partially due to underlying COPD and tobacco use, she was given IV Lasix  again for diuresis.  She was transition to p.o. Lasix  20 mg daily on 11/29/2023.  It was felt that she  will need continue oxygen support at the time of discharge and continue to wean at SNF. She had refused SNF discharge initially and now is agreeable.  Social worker reports patient has a bed at [facility] today.  She was seen by Dr. Loni today and felt stable for discharge to SNF today. - Labs today revealed mild hyponatremia 130, otherwise unremarkable BMP.  Magnesium  1.9.  CBC differential with leukocytosis 16 500, hemoglobin 10, platelet 452k.  Iron panel revealed 7% saturation ratio, ferritin 426.  Suspect leukocytosis is reactive, she has not had any fever or signs indicating infection, repeat CBC differential should be considered in 1 week while monitoring for signs of infection.  Iron panel does not suggest overt iron deficiency anemia, suspect hospital acquired anemia due to procedure related blood loss, she has not had any bleeding, repeat CBC should be considered in a month to reassess anemia.  -Medical therapy for CAD: Continue aspirin  and Plavix  for 76-month; as needed nitroglycerin ; Lipitor  80 mg daily  Paroxysmal atrial  fibrillation (post procedure) - Brief A-fib RVR on 11/22/2023 morning, converted to sinus rhythm, has no recurrence so far -Transition to amiodarone  200 mg daily today, consider stop amiodarone  if no further recurrence of A-fib after a short course of 2 to 54-month -Anticoagulation was not started given brief nature of A-fib and pericardial drain at the time, if there is recurrence of A-fib in the future, long-term anticoagulation is indicated - added TSH today for baseline evaluation, LFTs WNL from 10/28/23, will need repeat TSH, LFT in 4-6 weeks.  Acute hypoxic respiratory failure -Had persistent oxygen requirement during this 12 days hospitalization, underwent thoracentesis for pleural effusion on 11/22/2023, responded well to IV Lasix  with net -4L this admission, suspect underlying tobacco use/COPD and overall deconditioning contributing, will discharge oxygen support with ambulation, continue wean off oxygen at SNF -Continue p.o. Lasix  20 mg daily for volume overload, may stop diuretic at follow-up visit if felt not indicated  Leukocytosis -Received 5 days of broad-spectrum antibiotic during CVICU stay for leukocytosis, blood culture from 11/22/2023 negative to date, she had no fever or signs suggest infection, WBC 16 500 today, suspect this is reactive, consider continue monitor signs of infection and repeat CBC differential during next week  Macrocytic anemia  - Hemoglobin baseline around 14, hemoglobin 10 at the time of discharge today, no active bleeding -Iron panel today revealed 7% saturation ratio, ferritin 426, TIBC WNL -Suspect this is hospital-acquired anemia due to procedure blood loss and blood draw, consider repeat CBC in 2 to 4 weeks to reassess anemia    Hypothyroidism -PTA levothyroxine  88 mcg continued  Hypertension - Blood pressure has remained low normal 90-100s systolic, previous home medication metoprolol  XL 25 mg daily and losartan  12.5mg  hide being held, will not resume  at this time, show blood pressure become elevated, can resume above antihypertensive   Did the patient have an acute coronary syndrome (MI, NSTEMI, STEMI, etc) this admission?:  No                               Did the patient have a percutaneous coronary intervention (stent / angioplasty)?:  Yes.     Cath/PCI Registry Performance & Quality Measures: Aspirin  prescribed? - Yes ADP Receptor Inhibitor (Plavix /Clopidogrel , Brilinta/Ticagrelor or Effient/Prasugrel) prescribed (includes medically managed patients)? - Yes High Intensity Statin (Lipitor  40-80mg  or Crestor  20-40mg ) prescribed? - Yes For EF <40%, was ACEI/ARB prescribed? - Not Applicable (EF >/=  40%) For EF <40%, Aldosterone Antagonist (Spironolactone or Eplerenone) prescribed? - Not Applicable (EF >/= 40%) Cardiac Rehab Phase II ordered? - Yes       The patient will be scheduled for a TOC follow up appointment in 6 days.  A message has been sent to the [facility] and Scheduling Pool at the office where the patient should be seen for follow up.     Discharge Vitals Blood pressure (!) 118/58, pulse (!) 56, temperature 97.7 F (36.5 C), temperature source Oral, resp. rate 18, height 5' 3 (1.6 m), weight 50.5 kg, SpO2 92%.  Filed Weights   11/29/23 0427 11/30/23 0600 12/01/23 0051  Weight: 47.9 kg 49.9 kg 50.5 kg   See attending progress note today for physical exam  Labs & Radiologic Studies  CBC Recent Labs    11/30/23 0225 12/01/23 0328  WBC 14.5* 16.5*  HGB 10.1* 10.0*  HCT 31.1* 30.2*  MCV 102.3* 102.7*  PLT 429* 452*   Basic Metabolic Panel Recent Labs    92/92/74 0225 12/01/23 0328  NA 130* 130*  K 4.3 4.1  CL 98 97*  CO2 27 24  GLUCOSE 99 109*  BUN 16 17  CREATININE 0.81 0.81  CALCIUM  7.7* 7.9*  MG 2.0 1.9   Liver Function Tests No results for input(s): AST, ALT, ALKPHOS, BILITOT, PROT, ALBUMIN in the last 72 hours. No results for input(s): LIPASE, AMYLASE in the last 72  hours. High Sensitivity Troponin:   No results for input(s): TROPONINIHS in the last 720 hours.  No results for input(s): TRNPT in the last 720 hours.  BNP Invalid input(s): POCBNP No results for input(s): PROBNP in the last 72 hours.  No results for input(s): BNP in the last 72 hours.  D-Dimer No results for input(s): DDIMER in the last 72 hours. Hemoglobin A1C No results for input(s): HGBA1C in the last 72 hours. Fasting Lipid Panel No results for input(s): CHOL, HDL, LDLCALC, TRIG, CHOLHDL, LDLDIRECT in the last 72 hours. Lipoprotein (a)  Date/Time Value Ref Range Status  10/28/2023 02:43 AM 35.6 (H) <75.0 nmol/L Final    Comment:    (NOTE) Note:  Values greater than or equal to 75.0 nmol/L may       indicate an independent risk factor for CHD,       but must be evaluated with caution when applied       to non-Caucasian populations due to the       influence of genetic factors on Lp(a) across       ethnicities. Performed At: [facility] [address] [place], [place] [id number] Jennette Shorter MD Ey:[phone]     Thyroid  Function Tests No results for input(s): TSH, T4TOTAL, T3FREE, THYROIDAB in the last 72 hours.  Invalid input(s): FREET3 _____________  DG CHEST PORT 1 VIEW Result Date: 11/23/2023 CLINICAL DATA:  Hypoxia EXAM: PORTABLE CHEST 1 VIEW COMPARISON:  X-ray 11/22/2023 and older FINDINGS: Normal cardiopericardial silhouette with a calcified aorta. Chronic interstitial lung changes are again seen. No pneumothorax. Tiny right pleural effusion with some mild lung base opacities are similar to previous. Overlapping cardiac leads. There is a right-sided PICC in place. Second potential catheter just above this at the right upper thorax. Please correlate with clinical history. IMPRESSION: No significant interval change. Electronically Signed   By: Ranell Bring M.D.   On: 11/23/2023 10:02   DG CHEST PORT 1 VIEW Result Date:  11/22/2023 CLINICAL DATA:  PICC placement. EXAM: PORTABLE CHEST 1 VIEW COMPARISON:  Radiograph yesterday FINDINGS: Tip of the right upper extremity PICC overlies the mid SVC. Question of a second catheter projecting over the right supraclavicular region, tip in the midline. No pneumothorax. Shifting opacities at the lung bases, worsening on the right but improving on the left. Small left pleural effusion persists. Background hyperinflation and bronchial thickening. Stable heart size and mediastinal contours. IMPRESSION: 1. Tip of the right upper extremity PICC overlies the mid SVC. 2. Question of a second catheter projecting over the right supraclavicular region, tip in the midline. Recommend correlation with physical exam. 3. Shifting opacities at the lung bases, worsening on the right but improving on the left. Small left pleural effusion persists. Electronically Signed   By: Andrea Gasman M.D.   On: 11/22/2023 17:52   US  EKG SITE RITE Result Date: 11/22/2023 If Site Rite image not attached, placement could not be confirmed due to current cardiac rhythm.  ECHOCARDIOGRAM LIMITED Result Date: 11/22/2023    ECHOCARDIOGRAM LIMITED REPORT   Patient Name:   Kathryn Cuevas Date of Exam: 11/22/2023 Medical Rec #:  [id number]   Height:       63.0 in Accession #:    [id number]  Weight:       110.0 lb Date of Birth:  01/13/1932   BSA:          1.500 m Patient Age:    89 years    BP:           104/49 mmHg Patient Gender: F           HR:           140 bpm. Exam Location:  Inpatient Procedure: Limited Echo, Cardiac Doppler and Color Doppler (Both Spectral and            Color Flow Doppler were utilized during procedure). Indications:    I31.3 Pericardial effusion (noninflammatory); R94.31 Abnormal                 EKG  History:        Patient has prior history of Echocardiogram examinations, most                 recent 11/20/2023. Previous Myocardial Infarction and CAD, COPD;                 Risk Factors:Current Smoker.  Pericardial effusion.  Sonographer:    Ellouise Mose RDCS Referring Phys: [id number] ADITYA SABHARWAL  Sonographer Comments: High Fowler's position IMPRESSIONS  1. No pericardial effusion is present.  2. Left ventricular ejection fraction, by estimation, is 70 to 75%. The left ventricle has hyperdynamic function. The left ventricle has no regional wall motion abnormalities. Left ventricular diastolic parameters are indeterminate.  3. Right ventricular systolic function is mildly reduced. The right ventricular size is mildly enlarged. There is normal pulmonary artery systolic pressure. The estimated right ventricular systolic pressure is 28.8 mmHg.  4. Trivial mitral valve regurgitation.  5. Tricuspid valve regurgitation is moderate.  6. The aortic valve is tricuspid. Aortic valve regurgitation is mild. Aortic valve sclerosis is present, with no evidence of aortic valve stenosis.  7. The inferior vena cava is dilated in size with >50% respiratory variability, suggesting right atrial pressure of 8 mmHg. Comparison(s): No significant change from prior study. FINDINGS  Left Ventricle: Left ventricular ejection fraction, by estimation, is 70 to 75%. The left ventricle has hyperdynamic function. The left ventricle has no regional wall motion abnormalities. Left ventricular diastolic parameters are indeterminate. Right Ventricle: The right  ventricular size is mildly enlarged. No increase in right ventricular wall thickness. Right ventricular systolic function is mildly reduced. There is normal pulmonary artery systolic pressure. The tricuspid regurgitant velocity  is 2.28 m/s, and with an assumed right atrial pressure of 8 mmHg, the estimated right ventricular systolic pressure is 28.8 mmHg. Left Atrium: Left atrial size was normal in size. Right Atrium: Right atrial size was normal in size. Pericardium: No pericardial effusion is present. There is no evidence of pericardial effusion. Mitral Valve: Trivial mitral valve  regurgitation. Tricuspid Valve: The tricuspid valve is normal in structure. Tricuspid valve regurgitation is moderate . No evidence of tricuspid stenosis. Aortic Valve: The aortic valve is tricuspid. Aortic valve regurgitation is mild. Aortic valve sclerosis is present, with no evidence of aortic valve stenosis. Pulmonic Valve: The pulmonic valve was normal in structure. Aorta: The aortic root and ascending aorta are structurally normal, with no evidence of dilitation. Venous: The inferior vena cava is dilated in size with greater than 50% respiratory variability, suggesting right atrial pressure of 8 mmHg. IAS/Shunts: The atrial septum is grossly normal. Additional Comments: There is a small pleural effusion in the left lateral region. Spectral Doppler performed. Color Doppler performed.   LV Volumes (MOD) LV vol d, MOD A2C: 30.8 ml LV vol d, MOD A4C: 33.2 ml LV vol s, MOD A2C: 12.9 ml LV vol s, MOD A4C: 13.4 ml LV SV MOD A2C:     17.9 ml LV SV MOD A4C:     33.2 ml LV SV MOD BP:      19.1 ml IVC IVC diam: 2.60 cm  AORTA Ao Asc diam: 2.90 cm TRICUSPID VALVE TR Peak grad:   20.8 mmHg TR Vmax:        228.00 cm/s Darryle Decent MD Electronically signed by Darryle Decent MD Signature Date/Time: 11/22/2023/10:50:39 AM    Final    DG CHEST PORT 1 VIEW Result Date: 11/21/2023 CLINICAL DATA:  10026 Shortness of breath 10026 EXAM: PORTABLE CHEST 1 VIEW COMPARISON:  Chest x-ray 10/27/2023 FINDINGS: The heart and mediastinal contours are unchanged. Atherosclerotic plaque. Coronary artery stent. Retrocardiac airspace opacity. Right base atelectasis. Chronic coarsened interstitial markings with no overt pulmonary edema. Small left pleural effusion. No pneumothorax. No acute osseous abnormality. IMPRESSION: 1. Interval development of a small left pleural effusion with associated retrocardiac airspace opacity. 2.  Aortic Atherosclerosis (ICD10-I70.0). Electronically Signed   By: Morgane  Naveau M.D.   On: 11/21/2023 14:32    ECHOCARDIOGRAM LIMITED Result Date: 11/20/2023    ECHOCARDIOGRAM LIMITED REPORT   Patient Name:   TAYONNA BACHA Date of Exam: 11/20/2023 Medical Rec #:  [id number]   Height:       63.0 in Accession #:    [id number]  Weight:       110.0 lb Date of Birth:  08-13-1931   BSA:          1.500 m Patient Age:    89 years    BP:           128/58 mmHg Patient Gender: F           HR:           62 bpm. Exam Location:  Inpatient Procedure: Limited Echo, Cardiac Doppler and Color Doppler (Both Spectral and            Color Flow Doppler were utilized during procedure). Indications:    I31.3 Pericardial effusion (noninflammatory)  History:        Patient has  prior history of Echocardiogram examinations, most                 recent 11/19/2023. Previous Myocardial Infarction and CAD,                 Signs/Symptoms:Chest Pain; Risk Factors:Current Smoker.                 Pericardial effusion. Tamponade.  Sonographer:    Ellouise Mose RDCS Referring Phys: [phone] PETER M SWAZILAND IMPRESSIONS  1. Left ventricular ejection fraction, by estimation, is 60 to 65%. The left ventricle has normal function. The left ventricle has no regional wall motion abnormalities. There is mild concentric left ventricular hypertrophy.  2. Right ventricular systolic function is normal. The right ventricular size is normal. There is normal pulmonary artery systolic pressure. The estimated right ventricular systolic pressure is 34.4 mmHg.  3. There is no evidence of cardiac tamponade.  4. Tricuspid valve regurgitation is moderate to severe.  5. The aortic valve is tricuspid. There is mild calcification of the aortic valve. Aortic valve regurgitation is mild to moderate. Aortic valve sclerosis/calcification is present, without any evidence of aortic stenosis.  6. The inferior vena cava is dilated in size with <50% respiratory variability, suggesting right atrial pressure of 15 mmHg. Conclusion(s)/Recommendation(s): Pericardial effusion no longer present. FINDINGS  Left  Ventricle: Left ventricular ejection fraction, by estimation, is 60 to 65%. The left ventricle has normal function. The left ventricle has no regional wall motion abnormalities. The left ventricular internal cavity size was normal in size. There is  mild concentric left ventricular hypertrophy. Right Ventricle: The right ventricular size is normal. Right ventricular systolic function is normal. There is normal pulmonary artery systolic pressure. The tricuspid regurgitant velocity is 2.20 m/s, and with an assumed right atrial pressure of 15 mmHg, the estimated right ventricular systolic pressure is 34.4 mmHg. Pericardium: There is no evidence of pericardial effusion. There is no evidence of cardiac tamponade. Tricuspid Valve: Tricuspid valve regurgitation is moderate to severe. Aortic Valve: The aortic valve is tricuspid. There is mild calcification of the aortic valve. Aortic valve regurgitation is mild to moderate. Aortic valve sclerosis/calcification is present, without any evidence of aortic stenosis. Venous: The inferior vena cava is dilated in size with less than 50% respiratory variability, suggesting right atrial pressure of 15 mmHg. Additional Comments: Spectral Doppler performed. Color Doppler performed.  LEFT VENTRICLE PLAX 2D LVIDd:         4.00 cm LVIDs:         2.70 cm LV PW:         1.00 cm LV IVS:        0.90 cm  IVC IVC diam: 2.50 cm  AORTA Ao Asc diam: 3.20 cm TRICUSPID VALVE TR Peak grad:   19.4 mmHg TR Vmax:        220.00 cm/s Toribio Fuel MD Electronically signed by Toribio Fuel MD Signature Date/Time: 11/20/2023/8:50:13 AM    Final    CARDIAC CATHETERIZATION Result Date: 11/19/2023   Non-stenotic Prox RCA lesion was previously treated.   Non-stenotic Mid RCA lesion.   Non-stenotic Ost RCA to Prox RCA lesion.   A covered stent was successfully placed using a STENT PK PAPYRUS 4.0X15.   A drug-eluting stent was successfully placed using a STENT SYNERGY XD 4.0X8.   Post intervention, there  is a 0% residual stenosis.   Post intervention, there is a 0% residual stenosis.   Recommend uninterrupted dual antiplatelet therapy with Aspirin  81mg  daily  and Clopidogrel  75mg  daily for a minimum of 12 months (ACS-Class I recommendation). Findings of wire perforation of tiny side branch of mid RCA resulting in pericardial effusion and tamponade Successful pericardiocentesis from subxyphoid approach Successful sealing of perforation by placement of Covered stent in the mid RCA Stenting of the ostial RCA to ensure coverage of focal dissection Plan: monitor in ICU. Will leave pericardial drain in place. Repeat Echo in am. Continue DAPT for one year.   ECHOCARDIOGRAM LIMITED Result Date: 11/19/2023    ECHOCARDIOGRAM LIMITED REPORT   Patient Name:   Kathryn Cuevas Date of Exam: 11/19/2023 Medical Rec #:  [id number]   Height:       63.0 in Accession #:    [id number]  Weight:       110.0 lb Date of Birth:  1931/12/16   BSA:          1.500 m Patient Age:    89 years    BP:           110/72 mmHg Patient Gender: F           HR:           60 bpm. Exam Location:  Inpatient Procedure: 2D Echo (Both Spectral and Color Flow Doppler were utilized during            procedure). Indications:     Pericardial effusion I31.3  History:         Patient has prior history of Echocardiogram examinations, most                  recent 11/19/2023. Previous Myocardial Infarction, COPD; Risk                  Factors:Current Smoker.  Sonographer:     Thea Norlander RCS Referring Phys:  [phone] PETER M SWAZILAND Diagnosing Phys: Toribio Fuel MD IMPRESSIONS  1. Left ventricular ejection fraction, by estimation, is 55 to 60%. The left ventricle has normal function.  2. Right ventricular systolic function is normal.  3. Small to moderate effusion over RV free wall and apex.  4. There is mild calcification of the aortic valve. Conclusion(s)/Recommendation(s): Patient currently in cath lab having effusion drained. FINDINGS  Left Ventricle: Left  ventricular ejection fraction, by estimation, is 55 to 60%. The left ventricle has normal function. Right Ventricle: Right ventricular systolic function is normal. Pericardium: Small to moderate effusion over RV free wall and apex. The pericardial effusion is anterior to the right ventricle and surrounding the apex. Aortic Valve: There is mild calcification of the aortic valve. Toribio Fuel MD Electronically signed by Toribio Fuel MD Signature Date/Time: 11/19/2023/5:42:28 PM    Final (Updated)    ECHOCARDIOGRAM LIMITED Result Date: 11/19/2023    ECHOCARDIOGRAM LIMITED REPORT   Patient Name:   TENLEE WOLLIN Date of Exam: 11/19/2023 Medical Rec #:  [id number]   Height:       63.0 in Accession #:    [id number]  Weight:       110.0 lb Date of Birth:  1931-06-14   BSA:          1.500 m Patient Age:    89 years    BP:           140/60 mmHg Patient Gender: F           HR:           55 bpm. Exam Location:  Inpatient Procedure: 2D Echo and Limited Echo (Both Spectral  and Color Flow Doppler were            utilized during procedure). Indications:     Pericardial Effusion  History:         Patient has prior history of Echocardiogram examinations, most                  recent 10/28/2023. Risk Factors:Current Smoker.  Sonographer:     Philomena Daring Referring Phys:  [id number] EZUZM M SWAZILAND Diagnosing Phys: Toribio Fuel MD IMPRESSIONS  1. Left ventricular ejection fraction, by estimation, is 60 to 65%. The left ventricle has normal function. There is mild concentric left ventricular hypertrophy.  2. Right ventricular systolic function is normal. The right ventricular size is normal.  3. Left atrial size was mildly dilated.  4. Trivial pericardial effusion. EF improved since previous         Study amended from previous. FINDINGS  Left Ventricle: Left ventricular ejection fraction, by estimation, is 60 to 65%. The left ventricle has normal function. The left ventricular internal cavity size was normal in size. There is mild  concentric left ventricular hypertrophy. Right Ventricle: The right ventricular size is normal. Right ventricular systolic function is normal. Left Atrium: Left atrial size was mildly dilated. Pericardium: Trivial pericardial effusion. EF improved since previous Study amended from previous. Trivial pericardial effusion is present. The pericardial effusion is anterior to the right ventricle. Toribio Fuel MD Electronically signed by Toribio Fuel MD Signature Date/Time: 11/19/2023/2:06:31 PM    Final (Updated)    CARDIAC CATHETERIZATION Result Date: 11/19/2023   Mid Cx lesion is 30% stenosed.   Prox RCA lesion is 80% stenosed.   2nd Diag lesion is 70% stenosed.   Non-stenotic Mid LAD lesion was previously treated.   Non-stenotic Prox LAD lesion was previously treated.   A drug-eluting stent was successfully placed using a STENT SYNERGY XD 4.0X38.   Post intervention, there is a 0% residual stenosis.   Recommend uninterrupted dual antiplatelet therapy with Aspirin  81mg  daily and Clopidogrel  75mg  daily for a minimum of 12 months (ACS-Class I recommendation). Successful PCI of the proximal RCA with orbital atherectomy and stenting. Procedure was complicated by guide induced perforation and hemodynamic collapse that responded to brief CPR, pressors and balloon sealing of perforation. Successful DES x 1. No Effusion on Echo. Plan: observe overnight. DAPT for one year. Anticipate DC tomorrow if no further complications.    Disposition Patient is seen by Dr. Loni today, deemed stable for discharge to SNF.  Post-cath care , medication change , follow-up plan had reviewed with the patient over the phone, all questions answered to satisfaction.  Pt is being discharged to SNF today in good condition.    Follow-up Plans & Appointments  Contact information for follow-up providers     Rana Lum CROME, NP Follow up on 12/07/2023.   Specialty: Cardiology Why: at  10:55 am for your cardiology follow up  appointment Contact information: [address] [place] [address] [phone]              Contact information for after-discharge care     Destination     [name] .   Service: Skilled Nursing Contact information: [address] [name] [place]  [address] [phone]                    Discharge Instructions     Amb Referral to Cardiac Rehabilitation   Complete by: As directed    [facility] for  cardiac rehab.   Diagnosis: Coronary Stents   After initial evaluation and assessments completed: Virtual Based Care may be provided alone or in conjunction with Phase 2 Cardiac Rehab based on patient barriers.: Yes   Intensive Cardiac Rehabilitation (ICR) [facility] location only OR Traditional Cardiac Rehabilitation (TCR) *If criteria for ICR are not met will enroll in TCR [facility] only): Yes   Diet - low sodium heart healthy   Complete by: As directed    Discharge instructions   Complete by: As directed    PLEASE REMEMBER TO BRING ALL OF YOUR MEDICATIONS TO EACH OF YOUR FOLLOW-UP OFFICE VISITS.  PLEASE ATTEND ALL SCHEDULED FOLLOW-UP APPOINTMENTS.   Activity: Increase activity slowly as tolerated. You may shower, but no soaking baths (or swimming) for 1 week. No driving for 24 hours. No lifting over 5 lbs for 1 week. No sexual activity for 1 week.   Wound Care: You may wash cath site gently with soap and water. Keep cath site clean and dry. If you notice pain, swelling, bleeding or pus at your cath site, please call [phone].   Radial Site Care  Refer to this sheet in the next few weeks. These instructions provide you with information on caring for yourself after your procedure. Your caregiver may also give you more specific instructions. Your treatment has been planned according to current medical practices, but problems sometimes occur. Call your caregiver if you have any problems or questions after your procedure.  HOME CARE  INSTRUCTIONS You may shower the day after the procedure. Remove the bandage (dressing) and gently wash the site with plain soap and water. Gently pat the site dry.  Do not apply powder or lotion to the site.  Do not submerge the affected site in water for 3 to 5 days.  Inspect the site at least twice daily.  Do not flex or bend the affected arm for 24 hours.  No lifting over 5 pounds (2.3 kg) for 5 days after your procedure.  Do not drive home if you are discharged the same day of the procedure. Have someone else drive you.  You may drive 24 hours after the procedure unless otherwise instructed by your caregiver.   What to expect: Any bruising will usually fade within 1 to 2 weeks.  Blood that collects in the tissue (hematoma) may be painful to the touch. It should usually decrease in size and tenderness within 1 to 2 weeks.   SEEK IMMEDIATE MEDICAL CARE IF: You have unusual pain at the radial site.  You have redness, warmth, swelling, or pain at the radial site.  You have drainage (other than a small amount of blood on the dressing).  You have chills.  You have a fever or persistent symptoms for more than 72 hours.  You have a fever and your symptoms suddenly get worse.  Your arm becomes pale, cool, tingly, or numb.  You have heavy bleeding from the site. Hold pressure on the site.    PLEASE DO NOT MISS ANY DOSES OF YOUR ASPIRIN /PLAVIX  !!!!!   Also keep a log of you blood pressures and bring back to your follow up appt. Please call the office with any questions.   Patients taking blood thinners should generally stay away from medicines like ibuprofen , Advil , Motrin , naproxen, and Aleve due to risk of stomach bleeding. You may take Tylenol  as directed or talk to your primary doctor about alternatives.  PLEASE ENSURE THAT YOU DO NOT RUN OUT OF YOUR ASPIRIN /PLAVIX . This medication  is very important to remain on for at least one year. IF you have issues obtaining this medication due to  cost please CALL the office 3-5 business days prior to running out in order to prevent missing doses of this medication.   Increase activity slowly   Complete by: As directed        Discharge Medications Allergies as of 12/01/2023       Reactions   Levothyroxine  Other (See Comments)   Shoulder/neck pain. Can tolerate Synthroid .    Augmentin [amoxicillin-pot Clavulanate] Rash   Sulfa Antibiotics Rash        Medication List     STOP taking these medications    losartan  25 MG tablet Commonly known as: COZAAR    metoprolol  succinate 25 MG 24 hr tablet Commonly known as: TOPROL -XL       TAKE these medications    amiodarone  200 MG tablet Commonly known as: PACERONE  Take 1 tablet (200 mg total) by mouth daily. Start taking on: December 02, 2023   aspirin  EC 81 MG tablet Take 1 tablet (81 mg total) by mouth daily. Swallow whole.   atorvastatin  80 MG tablet Commonly known as: LIPITOR  Take 1 tablet (80 mg total) by mouth daily. What changed:  medication strength how much to take   clopidogrel  75 MG tablet Commonly known as: PLAVIX  Take 1 tablet (75 mg total) by mouth daily with breakfast.   furosemide  20 MG tablet Commonly known as: LASIX  Take 1 tablet (20 mg total) by mouth daily. Start taking on: December 02, 2023   nitroGLYCERIN  0.4 MG SL tablet Commonly known as: NITROSTAT  Place 1 tablet (0.4 mg total) under the tongue every 5 (five) minutes x 3 doses as needed for chest pain.   Synthroid  88 MCG tablet Generic drug: levothyroxine  Take 88 mcg by mouth daily.         Outstanding Labs/Studies   Duration of Discharge Encounter: APP Time: 60 minutes   Signed, Shirl Fruits, NP 12/01/2023, 2:19 PM  Attending attestation: Patient Name: Candis DELENA Lofts Date of Encounter: 12/01/2023 [facility] Cardiologist: Alm Clay, MD    Interval Summary   BP stable.  Denies any chest pain or dyspnea   Vital Signs       Vitals:    11/30/23 1921 12/01/23 0051  12/01/23 0405 12/01/23 0730  BP: (!) 110/37 (!) 103/52 (!) 111/49 (!) 118/58  Pulse: 60 63 61 62  Resp: 20 20 17 17   Temp:   (!) 97.5 F (36.4 C) 98 F (36.7 C) 97.7 F (36.5 C)  TempSrc:   Oral Oral Oral  SpO2: 93% 91% 90% 92%  Weight:   50.5 kg      Height:            Intake/Output Summary (Last 24 hours) at 12/01/2023 0940 Last data filed at 11/30/2023 1817    Gross per 24 hour  Intake 120 ml  Output 700 ml  Net -580 ml        12/01/2023   12:51 AM 11/30/2023    6:00 AM 11/29/2023    4:27 AM  Last 3 Weights  Weight (lbs) 111 lb 5.3 oz 110 lb 0.2 oz 105 lb 9.6 oz  Weight (kg) 50.5 kg 49.9 kg 47.9 kg     Telemetry/ECG  Sinus rhythm, HR 60-70s Personally Reviewed   Physical Exam  GEN: No acute distress, on supplemental oxygen.   Neck: + JVD Cardiac: RRR, no murmurs, rubs, or gallops.  Respiratory: diminished breath  sounds. GI: Soft, nontender, non-distended  MS: No edema   Assessment & Plan  Kathryn Cuevas is a 89 y.o. female with recent NSTEMI, HLD and hypothyroid. Admitted post PCI post complication    CAD s/p PCI to ostial RCA  Post PCI perforation Pericardial effusion, tamponade, brief cardiac arrest  LHC showed: mid Cx 30% stenosed, prox RCA 80% stenosed, 2nd Diag 70% stenosed. Previously treated mid LAD and prox LAD. DES placed in prox RCA  During procedure side branch of mid RCA was perforated  Resulting in pericardial effusion > tamponade Pericardial drain was placed during procedure, removed 6/29 EF post intervention recovered to 45-50% Repeat echo 6/29 showed EF 70%, no pericardial effusion  Transferred out of ICU on 7/3 No longer requiring pressors Continue ASA + Plavix  Continue Lipitor  80 mg daily    Brief episode of atrial fibrillation with RVR Experienced episode of A. Fib with RVR 6/29, lasting around 2 hours No noted recurrence of A. Fib since that event One brief run of SVT 7/5 Not currently on anticoagulation Continue amiodarone  200 mg daily. Can  consider stopping as outpatient if no recurrence after short course of 2-3 mo.  Consider long term AC if patient experiences another episode of A. Fib    Hypoxic respiratory failure  Pleural effusion s/p thoracentesis  Has persistent oxygen requirement  Thought to be related to COPD, but denies oxygen at baseline  Given IV Lasix , diuresed well Will need oxygen at discharge, O2 sats 88-89% on RA.  PO lasix  20 mg daily Net negative 4.2 L for admission   Leukocytosis - mild leukocytosis without fever and with improving respiratory status. Observe.   Anemia - baseline Hgb 14 however has been low since hospitalization but stable. Recheck as outpatient with iron studies. No signs of bleeding.    Disposition: [name] bed available. DC home today with O2.    F/u scheduled 7/14 with CHRISTELLA Louis NP Order repeat CBC, BMET prior to visit.   I spent 65 minutes in the care of DORETTE HARTEL today including reviewing labs (7/4-7/8), reviewing studies (echos 6/29, 6/27, 6/26, cath 6/26 ), face to face time discussing treatment options (15 min), and documenting in the encounter.   For questions or updates, please contact  [facility] Please consult [email or web address] for contact info under        Signed, Lasharn Bufkin A Haylee Mcanany, MD

## 2023-12-01 NOTE — Progress Notes (Signed)
 Physical Therapy Treatment Patient Details Name: Kathryn Cuevas MRN: [id number] DOB: 1932-01-30 Today's Date: 12/01/2023   History of Present Illness 89 yo female adm 11/19/23 for LHC with PCI with complication of pericardial effusion and brief arrest. 6/29 new Afib and thoracentesis. 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 Pt able to walk out of room on 4L with fatigue limiting activity. Pt educated for transfers, safety and HEP for strengthening. Pt with notable congestion this session able to clear some secretions, educated for IS with pt barely able to pull 500cc, discussed with RN. Will continue to follow.   HR 58-72 SpO2 87% on RA on arrival, returned to 2L with SPO2 93%, required 4L for gait at 92%    If plan is discharge home, recommend the following: A little help with walking and/or transfers;A little help with bathing/dressing/bathroom;Assistance with cooking/housework;Assist for transportation;Help with stairs or ramp for entrance   Can travel by private vehicle     Yes  Equipment Recommendations  None recommended by PT    Recommendations for Other Services       Precautions / Restrictions Precautions Precautions: Fall;Other (comment) Recall of Precautions/Restrictions: Impaired Precaution/Restrictions Comments: watch O2 Restrictions Weight Bearing Restrictions Per Provider Order: No     Mobility  Bed Mobility Overal bed mobility: Needs Assistance Bed Mobility: Supine to Sit     Supine to sit: HOB elevated, Used rails, Supervision     General bed mobility comments: HOB 20 degrees, no physical time, use of rail    Transfers Overall transfer level: Needs assistance   Transfers: Sit to/from Stand Sit to Stand: Contact guard assist            General transfer comment: mod cues for hand placement, pt with tendency to maintain hands on RW. performed 5 repeated stands from recliner with increased time    Ambulation/Gait Ambulation/Gait assistance: Contact guard assist Gait Distance (Feet): 60 Feet Assistive device: Rolling walker (2 wheels) Gait Pattern/deviations: Step-through pattern, Decreased stride length, Shuffle, Trunk flexed   Gait velocity interpretation: <1.8 ft/sec, indicate of risk for recurrent falls   General Gait Details: cues for posture and proximity to RW, encouragement to maximize distance. SPO2 92% on 4L with gait and HR 72   Stairs             Wheelchair Mobility     Tilt Bed    Modified Rankin (Stroke Patients Only)       Balance Overall balance assessment: Needs assistance Sitting-balance support: Single extremity supported, No upper extremity supported Sitting balance-Leahy Scale: Fair Sitting balance - Comments: EOb without support   Standing balance support: Reliant on assistive device for balance, During functional activity, Bilateral upper extremity supported Standing balance-Leahy Scale: Poor Standing balance comment: Reliant on RW                            Communication Communication Communication: Impaired Factors Affecting Communication: Hearing impaired  Cognition Arousal: Alert Behavior During Therapy: WFL for tasks assessed/performed   PT - Cognitive impairments: No family/caregiver present to determine baseline, Attention, Sequencing, Problem solving, Safety/Judgement                       PT - Cognition Comments: decreased vision, decreased awareness  of deficits, cues to initiate        Cueing Cueing Techniques: Verbal cues, Gestural cues  Exercises General Exercises - Lower Extremity Long Arc Quad: AROM, Both, 10 reps, Seated Hip Flexion/Marching: AROM, Both, 10 reps, Seated    General Comments        Pertinent Vitals/Pain Pain  Assessment Pain Assessment: No/denies pain    Home Living                          Prior Function            PT Goals (current goals can now be found in the care plan section) Progress towards PT goals: Progressing toward goals    Frequency    Min 2X/week      PT Plan      Co-evaluation              AM-PAC PT 6 Clicks Mobility   Outcome Measure  Help needed turning from your back to your side while in a flat bed without using bedrails?: A Little Help needed moving from lying on your back to sitting on the side of a flat bed without using bedrails?: A Little Help needed moving to and from a bed to a chair (including a wheelchair)?: A Little Help needed standing up from a chair using your arms (e.g., wheelchair or bedside chair)?: A Little Help needed to walk in hospital room?: A Lot Help needed climbing 3-5 steps with a railing? : Total 6 Click Score: 15    End of Session Equipment Utilized During Treatment: Oxygen Activity Tolerance: Patient tolerated treatment well Patient left: in chair;with call bell/phone within reach;with chair alarm set;with family/visitor present Nurse Communication: Mobility status PT Visit Diagnosis: Muscle weakness (generalized) (M62.81);Difficulty in walking, not elsewhere classified (R26.2);Other abnormalities of gait and mobility (R26.89);Unsteadiness on feet (R26.81)     Time: 1132-1202 PT Time Calculation (min) (ACUTE ONLY): 30 min  Charges:    $Gait Training: 8-22 mins $Therapeutic Exercise: 8-22 mins PT General Charges $$ ACUTE PT VISIT: 1 Visit                     Lenoard SQUIBB, PT Acute Rehabilitation Services Office: [phone]    Lenoard NOVAK Edmundo Tedesco 12/01/2023, 1:23 PM

## 2023-12-02 DIAGNOSIS — I25118 Atherosclerotic heart disease of native coronary artery with other forms of angina pectoris: Secondary | ICD-10-CM | POA: Diagnosis not present

## 2023-12-02 DIAGNOSIS — E785 Hyperlipidemia, unspecified: Secondary | ICD-10-CM | POA: Diagnosis not present

## 2023-12-02 DIAGNOSIS — E039 Hypothyroidism, unspecified: Secondary | ICD-10-CM | POA: Diagnosis not present

## 2023-12-02 DIAGNOSIS — I249 Acute ischemic heart disease, unspecified: Secondary | ICD-10-CM | POA: Diagnosis not present

## 2023-12-03 DIAGNOSIS — E039 Hypothyroidism, unspecified: Secondary | ICD-10-CM | POA: Diagnosis not present

## 2023-12-03 DIAGNOSIS — E785 Hyperlipidemia, unspecified: Secondary | ICD-10-CM | POA: Diagnosis not present

## 2023-12-03 DIAGNOSIS — I480 Paroxysmal atrial fibrillation: Secondary | ICD-10-CM | POA: Diagnosis not present

## 2023-12-03 DIAGNOSIS — I25118 Atherosclerotic heart disease of native coronary artery with other forms of angina pectoris: Secondary | ICD-10-CM | POA: Diagnosis not present

## 2023-12-03 DIAGNOSIS — I3139 Other pericardial effusion (noninflammatory): Secondary | ICD-10-CM | POA: Diagnosis not present

## 2023-12-03 DIAGNOSIS — I249 Acute ischemic heart disease, unspecified: Secondary | ICD-10-CM | POA: Diagnosis not present

## 2023-12-04 ENCOUNTER — Encounter: Payer: Self-pay | Admitting: *Deleted

## 2023-12-07 ENCOUNTER — Telehealth: Payer: Self-pay | Admitting: Cardiology

## 2023-12-07 ENCOUNTER — Ambulatory Visit: Attending: Emergency Medicine | Admitting: Emergency Medicine

## 2023-12-07 ENCOUNTER — Telehealth (HOSPITAL_COMMUNITY): Payer: Self-pay

## 2023-12-07 VITALS — BP 110/48 | HR 59 | Ht 63.0 in | Wt 109.0 lb

## 2023-12-07 DIAGNOSIS — I071 Rheumatic tricuspid insufficiency: Secondary | ICD-10-CM | POA: Diagnosis not present

## 2023-12-07 DIAGNOSIS — R197 Diarrhea, unspecified: Secondary | ICD-10-CM | POA: Diagnosis not present

## 2023-12-07 DIAGNOSIS — I3139 Other pericardial effusion (noninflammatory): Secondary | ICD-10-CM | POA: Insufficient documentation

## 2023-12-07 DIAGNOSIS — I209 Angina pectoris, unspecified: Secondary | ICD-10-CM

## 2023-12-07 DIAGNOSIS — I480 Paroxysmal atrial fibrillation: Secondary | ICD-10-CM | POA: Diagnosis not present

## 2023-12-07 DIAGNOSIS — I10 Essential (primary) hypertension: Secondary | ICD-10-CM | POA: Diagnosis present

## 2023-12-07 DIAGNOSIS — J9601 Acute respiratory failure with hypoxia: Secondary | ICD-10-CM | POA: Diagnosis present

## 2023-12-07 DIAGNOSIS — D539 Nutritional anemia, unspecified: Secondary | ICD-10-CM | POA: Diagnosis present

## 2023-12-07 DIAGNOSIS — I2510 Atherosclerotic heart disease of native coronary artery without angina pectoris: Secondary | ICD-10-CM | POA: Diagnosis not present

## 2023-12-07 DIAGNOSIS — E782 Mixed hyperlipidemia: Secondary | ICD-10-CM | POA: Insufficient documentation

## 2023-12-07 DIAGNOSIS — D72829 Elevated white blood cell count, unspecified: Secondary | ICD-10-CM | POA: Diagnosis not present

## 2023-12-07 NOTE — Telephone Encounter (Signed)
 Kathryn Cuevas from [name] needs lab orders faxed to [phone] so they can draw them. Pt went to [facility] this morning and they were not able to draw stating that pt was dehydrated

## 2023-12-07 NOTE — Patient Instructions (Signed)
 Medication Instructions:  NO CHANGES    Lab Work: BMET AND CBC   Testing/Procedures: NONE  Follow-Up: At [facility], you and your health needs are our priority.  As part of our continuing mission to provide you with exceptional heart care, our providers are all part of one team.  This team includes your primary Cardiologist (physician) and Advanced Practice Providers or APPs (Physician Assistants and Nurse Practitioners) who all work together to provide you with the care you need, when you need it.  Your next appointment:   2-3 MONTHS  Provider:   Alm Clay, MD

## 2023-12-07 NOTE — Progress Notes (Unsigned)
 Cardiology Office Note:    Date:  12/08/2023  ID:  Kathryn Cuevas, DOB [birth year], MRN [id number] PCP: Okey Carlin Redbird, MD  [facility] Providers Cardiologist:  Alm Clay, MD Cardiology APP:  Rana Lum CROME, NP       Patient Profile:       Chief Complaint: Follow-up hospitalization after staged PCI History of Present Illness:  Kathryn Cuevas is a 89 y.o. female with visit-pertinent history of hyperlipidemia, hypothyroidism   Patient was admitted to the hospital 10/27/2023 for evaluation of chest pain.  Patient was not routinely following with cardiology as she had no past cardiac history.  Was seen once by Dr. Clay in 2019, at that time there was concern the patient possibly had a TIA in 10/2016.  She had carotid ultrasounds in 11/2016 that showed no significant arthrosclerotic plaque bilaterally.  She had an echocardiogram in 09/2017 that showed LVEF of 60 to 65%, no RWMA, no significant valvular abnormalities.  No evidence of PFO or ASD.  Nothing to suggest source of possible TIA, she has not been seen by cardiology since that time.   She had presented to the ED on 10/27/2023 with reports of chest pain.  Troponins 160, 201.  Peak troponin 843.  EKG showed sinus rhythm with PACs, T wave inversions in V2-V6.  General cardiac catheterization with PCI of the proximal to mid LAD with DES x 2.  Was a prolonged difficult procedure due to calcification and difficult access.  She did have significant stenosis in the proximal RCA that was also calcified.  There was discussion of staged PCI.  Patient desires to be discharged however and return later for elective PCI.  She was started on DAPT with aspirin  and Plavix .  She was started on low-dose Toprol -XL.  She was to follow-up in office to discuss staged PCI of the RCA.  Echocardiogram 10/28/2023 with LVEF 45 to 50%, mild LVH, grade 1 DD, mild aortic valve regurgitation.  She was last seen in office on 11/09/2023.  She had significant improvement  with no recurrent chest pain or pressure.  Her fatigue has been improving.  She reported myalgias on rosuvastatin .  This was discontinued and she was started on atorvastatin  40 mg daily.  Staged PCI was ordered.    She presented for staged PCI to RCA on 11/19/2023.  She underwent successful PCI of the proximal RCA with orbital arthrectomy and stenting.  Catheterization was complicated by guide induced perforation and brief cardiac arrest that responded to pre-CPR, pressors, and balloon sealing of perforation.  Postprocedure echo showed trivial pericardial effusion.  Repeat cardiac catheterization on 6/26 afternoon due to persistent hypotension showed wire perforation of tiny sidebranch of the mid RCA resulting in pericardial effusion and tamponade.  She was treated successfully with pericardiocentesis, sealing of perforation by placement of covered stent in the mid RCA, and stenting of the ostial RCA to ensure coverage of focal dissection.  She was transferred to ICU postprocedure for monitor with pericardial drain in place.  Postprocedure echo showed LVEF 55 to 60%, small-moderate effusion.  Echocardiogram on 6/27 showed LVEF 60 to 65%, moderate LVH, RV normal, PASP 34.4 mmHg, no evidence of cardiac tamponade, TR moderate-severe, mild to moderate AI, aortic sclerosis.  She did require vasopressor support with Levophed  and vasopressin .  Her pericardial drain was removed on 6/28.  She did develop new onset A-fib RVR on 6/29.  She was started on IV amiodarone  with subsequent conversion to sinus rhythm.  Anticoagulation was not  started due to brief nature of atrial fibs.  She had no reoccurrence of A-fib and was transition to amiodarone  200 mg twice daily.  Long-term anticoagulation was felt indicated the patient had recurrent atrial fibrillation in the future.  She was given broad-spectrum antibiotics due to leukocytosis for 5 days with no clear source of infection.  Echocardiogram on 6/29 showed no pericardial  effusion with LVEF of 70 to 75%, no RWMA.  Pleural effusion was noted on echo she underwent left-sided thoracentesis on 6/29 by ICU team with 250 mL of blood-tinged appearing fluid drained.  During hospital stay she had persistent oxygen requirement, felt this was partially due to underlying COPD and tobacco use.  She was started on IV Lasix  and transition to p.o. Lasix  on 7/6.  It was felt she will need to continue oxygen support at the time of discharge.  She was discharged home to SNF.  Her home metoprolol  XL 25 mg daily and losartan  12.5 mg daily were held at discharge due to low normal blood pressure.   Discussed the use of AI scribe software for clinical note transcription with the patient, who gave verbal consent to proceed.  History of Present Illness Kathryn Cuevas is an 89 year old female with coronary artery disease who presents for follow-up after hospitalization.  Today patient arrives to clinic with her daughter.  She is currently at SNF residing at [facility].  Today she tells me she is doing well overall.  She is without acute cardiovascular concerns or complaints at this time.  She denies any chest pain or pressure.  She denies any anginal symptoms.  She is currently on 3L O2 at all times.  She is working with PT at [facility] and feels like she is getting stronger every day.  They have attempted to wean her off her oxygen however her SpO2 does drop on activity.  She is currently on amiodarone  to maintain normal heart rhythm and is taking aspirin  and Plavix  for at least one year. She is also on atorvastatin .  She is also on Lasix  to manage fluid retention.  She reports swelling is well-controlled at this time.  She reports her blood pressures at SNF have been well-controlled with no evidence of hypo or hypertension.  She denies any palpitations or symptoms concerning for recurrent atrial fibrillation.  She denies any orthopnea, PND, syncope, presyncope, lightheadedness or dizziness  Review of  systems:  Please see the history of present illness. All other systems are reviewed and otherwise negative.      Studies Reviewed:    EKG Interpretation Date/Time:  Monday December 07 2023 11:44:23 EDT Ventricular Rate:  52 PR Interval:  158 QRS Duration:  80 QT Interval:  500 QTC Calculation: 465 R Axis:   79  Text Interpretation: Sinus bradycardia Anteroseptal infarct (cited on or before 22-Nov-2023) Confirmed by Rana Dixon [phone]) on 12/07/2023 1:21:20 PM    Limited echocardiogram 11/22/2023  1. No pericardial effusion is present.   2. Left ventricular ejection fraction, by estimation, is 70 to 75%. The  left ventricle has hyperdynamic function. The left ventricle has no  regional wall motion abnormalities. Left ventricular diastolic parameters  are indeterminate.   3. Right ventricular systolic function is mildly reduced. The right  ventricular size is mildly enlarged. There is normal pulmonary artery  systolic pressure. The estimated right ventricular systolic pressure is  28.8 mmHg.   4. Trivial mitral valve regurgitation.   5. Tricuspid valve regurgitation is moderate.   6.  The aortic valve is tricuspid. Aortic valve regurgitation is mild.  Aortic valve sclerosis is present, with no evidence of aortic valve  stenosis.   7. The inferior vena cava is dilated in size with >50% respiratory  variability, suggesting right atrial pressure of 8 mmHg.   Cardiac catheterization 11/19/2023   Non-stenotic Prox RCA lesion was previously treated.   Non-stenotic Mid RCA lesion.   Non-stenotic Ost RCA to Prox RCA lesion.   A covered stent was successfully placed using a STENT PK PAPYRUS 4.0X15.   A drug-eluting stent was successfully placed using a STENT SYNERGY XD 4.0X8.   Post intervention, there is a 0% residual stenosis.   Post intervention, there is a 0% residual stenosis.   Recommend uninterrupted dual antiplatelet therapy with Aspirin  81mg  daily and Clopidogrel  75mg  daily  for a minimum of 12 months (ACS-Class I recommendation).   Findings of wire perforation of tiny side branch of mid RCA resulting in pericardial effusion and tamponade Successful pericardiocentesis from subxyphoid approach Successful sealing of perforation by placement of Covered stent in the mid RCA Stenting of the ostial RCA to ensure coverage of focal dissection Diagnostic Dominance: Right  Intervention   Risk Assessment/Calculations:    CHA2DS2-VASc Score = 6   This indicates a 9.7% annual risk of stroke. The patient's score is based upon: CHF History: 1 HTN History: 1 Diabetes History: 0 Stroke History: 0 Vascular Disease History: 1 Age Score: 2 Gender Score: 1             Physical Exam:   VS:  BP (!) 110/48 (BP Location: Left Arm, Patient Position: Sitting, Cuff Size: Normal)   Pulse (!) 59   Ht 5' 3 (1.6 m)   Wt 109 lb (49.4 kg)   BMI 19.31 kg/m    Wt Readings from Last 3 Encounters:  12/07/23 109 lb (49.4 kg)  12/01/23 111 lb 5.3 oz (50.5 kg)  11/09/23 108 lb (49 kg)    GEN: Well nourished, well developed in no acute distress NECK: No JVD; No carotid bruits CARDIAC: RRR, no murmurs, rubs, gallops RESPIRATORY:  Clear to auscultation without rales, wheezing or rhonchi  ABDOMEN: Soft, non-tender, non-distended EXTREMITIES:  No edema; No acute deformity      Assessment and Plan:  Coronary artery disease Post PCI perforation Pericardial effusion / Tamponade / Brief cardiac arrest LHC 10/27/2023 with PCI/DES x 2 with overlapping stents to proximal-mid LAD  Underwent staged PCI to RCA on 11/19/2023, with successful PCI of proximal RCA with orbital arthrectomy and stenting.  Procedure was complicated by guide induced perforation and hemodynamic collapse with brief cardiac arrest that responded to brief CPR, pressors and balloon sealing of perforation.  Post echo showed LVEF 60 to 65%, trivial pericardial effusion. Repeat LHC 11/19/2023 due to persistent hypotension  showed wire perforation of tiny sidebranch of mid RCA resulting in pericardial effusion and tamponade.  She was treated successfully with pericardiocentesis and sealing of perforation by placement of covered stent in the mid RCA and stenting of the ostial RCA to ensure coverage of focal dissection Postprocedure echo on 6/26 showed LVEF 55 to 60%, RV normal, small moderate effusion over RV free wall and apex Echocardiogram 6/27 showed EF 60 to 65%, no RWMA, mild LVH, RV normal, no evidence of cardiac tamponade Echocardiogram 6/29 with LVEF 70 to 75%, no RWMA, no pericardial effusion - Radial cath site healing appropriately without bruit, swelling, hematoma - EKG today shows NSR without acute ischemic changes - Today patient is  without anginal symptoms.  She denies any chest pains or prior anginal equivalent.  There is no indication for further ischemic evaluation at this time - Continue aspirin  81 mg daily and clopidogrel  75 mg daily - Continue atorvastatin  80 mg daily - CBC and BMET today  Paroxysmal atrial fibrillation (postprocedure) She developed new onset A-fib with RVR on 11/22/2023 during admission and started on IV amiodarone  with subsequent conversion to sinus rhythm and transition to p.o. amiodarone  EKG today shows she is maintaining sinus rhythm - She denies any symptoms concerning for recurrent atrial fibrillation today - Anticoagulation was not started given brief nature of A-fib and pericardial drain that was placed at that time.  Given there has been no recurrence anticoagulation is not indicated at this time.  Will consider long-term AC if patient experiences another episode of A-fib - She will need repeat TSH and LFTs at follow-up visit - Continue amiodarone  200 mg daily, will consider stopping amiodarone  if no further recurrence after 2-3 months  Hypoxic respiratory failure S/p thoracentesis for pleural effusion on 6/29 and responded well to IV Lasix  with net -4.2L She had  persistent oxygen requirement during admission, it was suspected underlying tobacco use/COPD and deconditioning could be contributing - Currently on 3L O2 at all times and working with SNF to slowly wean off oxygen, still not able to maintain SPO2 >89% on room air during exertion - No SOB at rest, orthopnea, PND, leg swelling  - Continue Lasix  20 mg daily - BMET today  Hypertension Blood pressure today is 110/48 and well-controlled BP has been well-controlled at SNF She experienced hypotension during admission and her PTA metoprolol  XL 25 mg daily and losartan  12.5 mg daily were held - No changes to current medications, continue to hold prior antihypertensives  Moderate tricuspid valve regurgitation Mild aortic valve regurgitation Trivial mitral valve regurgitation Per echocardiogram on 11/22/2023 -There is no indication for intervention at this time.  Will continue to monitor with routine echocardiogram  Leukocytosis She received 5 days of broad-spectrum antibiotic during admission with blood culture on 6/29 negative.  WBC elevation suspected to be reactive as no symptoms to suggest infection.  She does report daily diarrhea since being at SNF - Recommend onsite SNF provider perform stool sample - CBC today  Macrocytic anemia Baseline hemoglobin around 14 and hemoglobin of 10 at the time of discharge with no active bleeding Likely hospital-acquired anemia due to procedure blood loss in blood draws - Today she denies any bleeding complication - CBC today  Hyperlipidemia, LDL goal <55 LDL 102 on 10/2023 Lipoprotein (A) 35.6 on 10/2023 Experienced myalgias on rosuvastatin , started on atorvastatin  on 10/2023 - Today she denies any myalgias - Continue atorvastatin  40mg  daily - Repeat lipid panel/LFT at follow-up visit    Cardiac Rehabilitation Eligibility Assessment  The patient is ready to start cardiac rehabilitation from a cardiac standpoint.     Dispo:  Return in about 2 months  (around 02/07/2024).  Signed, Lum LITTIE Louis, NP

## 2023-12-07 NOTE — Telephone Encounter (Signed)
 Faxed outside referral for Phase II Cardiac Rehab to [facility].

## 2023-12-08 ENCOUNTER — Encounter: Payer: Self-pay | Admitting: Emergency Medicine

## 2023-12-08 LAB — BASIC METABOLIC PANEL WITH GFR: EGFR: 82

## 2023-12-08 NOTE — Telephone Encounter (Signed)
 Spoke with Dagoberto who stated that the pt had a CMP and CBC drawn at the facility instead of a BMP and CBC and she wanted to make sure that was okay. Explained that that is fine. Dagoberto asked who to put the labs at the attention of and she was told Lum Louis, NP. No further questions at this time.

## 2023-12-08 NOTE — Telephone Encounter (Signed)
 Caller Mateo) stated they had patient's blood drawn today and only had CMP and BMP done.  Caller wants to confirm the CMP test would be OK instead of the CBC test and can leave voice message.

## 2023-12-08 NOTE — Telephone Encounter (Signed)
 Attempted to call Dagoberto from [facility] back, unable to reach. LMTCB.

## 2023-12-09 ENCOUNTER — Ambulatory Visit: Payer: Self-pay | Admitting: Emergency Medicine

## 2023-12-11 DIAGNOSIS — I249 Acute ischemic heart disease, unspecified: Secondary | ICD-10-CM | POA: Diagnosis not present

## 2023-12-11 DIAGNOSIS — R197 Diarrhea, unspecified: Secondary | ICD-10-CM | POA: Diagnosis not present

## 2023-12-16 DIAGNOSIS — K5900 Constipation, unspecified: Secondary | ICD-10-CM | POA: Diagnosis not present

## 2023-12-16 DIAGNOSIS — J449 Chronic obstructive pulmonary disease, unspecified: Secondary | ICD-10-CM | POA: Diagnosis not present

## 2023-12-16 DIAGNOSIS — Z9981 Dependence on supplemental oxygen: Secondary | ICD-10-CM | POA: Diagnosis not present

## 2023-12-22 DIAGNOSIS — J449 Chronic obstructive pulmonary disease, unspecified: Secondary | ICD-10-CM | POA: Diagnosis not present

## 2023-12-23 NOTE — Telephone Encounter (Signed)
 Son called in to inform us  that patient is in facility at the moment. Calling to see what were the calls about. Son is not on her DPR. Please advise

## 2024-01-01 DIAGNOSIS — I25118 Atherosclerotic heart disease of native coronary artery with other forms of angina pectoris: Secondary | ICD-10-CM | POA: Diagnosis not present

## 2024-01-01 DIAGNOSIS — I480 Paroxysmal atrial fibrillation: Secondary | ICD-10-CM | POA: Diagnosis not present

## 2024-01-01 DIAGNOSIS — M5416 Radiculopathy, lumbar region: Secondary | ICD-10-CM | POA: Diagnosis not present

## 2024-01-01 DIAGNOSIS — E039 Hypothyroidism, unspecified: Secondary | ICD-10-CM | POA: Diagnosis not present

## 2024-01-05 DIAGNOSIS — J309 Allergic rhinitis, unspecified: Secondary | ICD-10-CM | POA: Diagnosis not present

## 2024-01-05 NOTE — Telephone Encounter (Signed)
 Son returned staff call regarding patient's test results.

## 2024-01-06 NOTE — Telephone Encounter (Signed)
 Returned call to patient's son.  No DPR on file.  Son offered that the patient is at rehab currently and hopeful to be discharged on 01/12/24.  He offered her mobile number that she has w her at rehab:  [phone].  I've added this and also sent the remarks from [facility] to her patient portal.  I will route the results to PCP as well.

## 2024-01-11 DIAGNOSIS — J189 Pneumonia, unspecified organism: Secondary | ICD-10-CM | POA: Diagnosis not present

## 2024-01-11 DIAGNOSIS — I480 Paroxysmal atrial fibrillation: Secondary | ICD-10-CM | POA: Diagnosis not present

## 2024-01-11 DIAGNOSIS — J449 Chronic obstructive pulmonary disease, unspecified: Secondary | ICD-10-CM | POA: Diagnosis not present

## 2024-01-12 DIAGNOSIS — J209 Acute bronchitis, unspecified: Secondary | ICD-10-CM | POA: Diagnosis not present

## 2024-01-14 DIAGNOSIS — J449 Chronic obstructive pulmonary disease, unspecified: Secondary | ICD-10-CM | POA: Diagnosis not present

## 2024-01-14 DIAGNOSIS — M6281 Muscle weakness (generalized): Secondary | ICD-10-CM | POA: Diagnosis not present

## 2024-01-14 DIAGNOSIS — I252 Old myocardial infarction: Secondary | ICD-10-CM | POA: Diagnosis not present

## 2024-01-14 DIAGNOSIS — Z720 Tobacco use: Secondary | ICD-10-CM | POA: Diagnosis not present

## 2024-01-14 DIAGNOSIS — K5900 Constipation, unspecified: Secondary | ICD-10-CM | POA: Diagnosis not present

## 2024-01-14 DIAGNOSIS — I480 Paroxysmal atrial fibrillation: Secondary | ICD-10-CM | POA: Diagnosis not present

## 2024-01-14 DIAGNOSIS — Z7902 Long term (current) use of antithrombotics/antiplatelets: Secondary | ICD-10-CM | POA: Diagnosis not present

## 2024-01-14 DIAGNOSIS — J309 Allergic rhinitis, unspecified: Secondary | ICD-10-CM | POA: Diagnosis not present

## 2024-01-14 DIAGNOSIS — R7303 Prediabetes: Secondary | ICD-10-CM | POA: Diagnosis not present

## 2024-01-14 DIAGNOSIS — D531 Other megaloblastic anemias, not elsewhere classified: Secondary | ICD-10-CM | POA: Diagnosis not present

## 2024-01-14 DIAGNOSIS — I119 Hypertensive heart disease without heart failure: Secondary | ICD-10-CM | POA: Diagnosis not present

## 2024-01-14 DIAGNOSIS — Z955 Presence of coronary angioplasty implant and graft: Secondary | ICD-10-CM | POA: Diagnosis not present

## 2024-01-14 DIAGNOSIS — I25118 Atherosclerotic heart disease of native coronary artery with other forms of angina pectoris: Secondary | ICD-10-CM | POA: Diagnosis not present

## 2024-01-14 DIAGNOSIS — I083 Combined rheumatic disorders of mitral, aortic and tricuspid valves: Secondary | ICD-10-CM | POA: Diagnosis not present

## 2024-01-14 DIAGNOSIS — E785 Hyperlipidemia, unspecified: Secondary | ICD-10-CM | POA: Diagnosis not present

## 2024-01-14 DIAGNOSIS — E039 Hypothyroidism, unspecified: Secondary | ICD-10-CM | POA: Diagnosis not present

## 2024-01-14 DIAGNOSIS — T82897D Other specified complication of cardiac prosthetic devices, implants and grafts, subsequent encounter: Secondary | ICD-10-CM | POA: Diagnosis not present

## 2024-01-15 DIAGNOSIS — I480 Paroxysmal atrial fibrillation: Secondary | ICD-10-CM | POA: Diagnosis not present

## 2024-01-15 DIAGNOSIS — I25118 Atherosclerotic heart disease of native coronary artery with other forms of angina pectoris: Secondary | ICD-10-CM | POA: Diagnosis not present

## 2024-01-15 DIAGNOSIS — T82897D Other specified complication of cardiac prosthetic devices, implants and grafts, subsequent encounter: Secondary | ICD-10-CM | POA: Diagnosis not present

## 2024-01-15 DIAGNOSIS — J449 Chronic obstructive pulmonary disease, unspecified: Secondary | ICD-10-CM | POA: Diagnosis not present

## 2024-01-15 DIAGNOSIS — I083 Combined rheumatic disorders of mitral, aortic and tricuspid valves: Secondary | ICD-10-CM | POA: Diagnosis not present

## 2024-01-15 DIAGNOSIS — I119 Hypertensive heart disease without heart failure: Secondary | ICD-10-CM | POA: Diagnosis not present

## 2024-01-18 DIAGNOSIS — T82897D Other specified complication of cardiac prosthetic devices, implants and grafts, subsequent encounter: Secondary | ICD-10-CM | POA: Diagnosis not present

## 2024-01-18 DIAGNOSIS — J449 Chronic obstructive pulmonary disease, unspecified: Secondary | ICD-10-CM | POA: Diagnosis not present

## 2024-01-18 DIAGNOSIS — I480 Paroxysmal atrial fibrillation: Secondary | ICD-10-CM | POA: Diagnosis not present

## 2024-01-18 DIAGNOSIS — I083 Combined rheumatic disorders of mitral, aortic and tricuspid valves: Secondary | ICD-10-CM | POA: Diagnosis not present

## 2024-01-18 DIAGNOSIS — I119 Hypertensive heart disease without heart failure: Secondary | ICD-10-CM | POA: Diagnosis not present

## 2024-01-18 DIAGNOSIS — I25118 Atherosclerotic heart disease of native coronary artery with other forms of angina pectoris: Secondary | ICD-10-CM | POA: Diagnosis not present

## 2024-01-19 DIAGNOSIS — J9611 Chronic respiratory failure with hypoxia: Secondary | ICD-10-CM | POA: Diagnosis not present

## 2024-01-19 DIAGNOSIS — J449 Chronic obstructive pulmonary disease, unspecified: Secondary | ICD-10-CM | POA: Diagnosis not present

## 2024-01-19 DIAGNOSIS — Z681 Body mass index (BMI) 19 or less, adult: Secondary | ICD-10-CM | POA: Diagnosis not present

## 2024-01-21 DIAGNOSIS — J449 Chronic obstructive pulmonary disease, unspecified: Secondary | ICD-10-CM | POA: Diagnosis not present

## 2024-01-21 DIAGNOSIS — I25118 Atherosclerotic heart disease of native coronary artery with other forms of angina pectoris: Secondary | ICD-10-CM | POA: Diagnosis not present

## 2024-01-21 DIAGNOSIS — I119 Hypertensive heart disease without heart failure: Secondary | ICD-10-CM | POA: Diagnosis not present

## 2024-01-21 DIAGNOSIS — I083 Combined rheumatic disorders of mitral, aortic and tricuspid valves: Secondary | ICD-10-CM | POA: Diagnosis not present

## 2024-01-21 DIAGNOSIS — T82897D Other specified complication of cardiac prosthetic devices, implants and grafts, subsequent encounter: Secondary | ICD-10-CM | POA: Diagnosis not present

## 2024-01-21 DIAGNOSIS — I480 Paroxysmal atrial fibrillation: Secondary | ICD-10-CM | POA: Diagnosis not present

## 2024-01-23 DIAGNOSIS — T82897D Other specified complication of cardiac prosthetic devices, implants and grafts, subsequent encounter: Secondary | ICD-10-CM | POA: Diagnosis not present

## 2024-01-23 DIAGNOSIS — J449 Chronic obstructive pulmonary disease, unspecified: Secondary | ICD-10-CM | POA: Diagnosis not present

## 2024-01-23 DIAGNOSIS — I119 Hypertensive heart disease without heart failure: Secondary | ICD-10-CM | POA: Diagnosis not present

## 2024-01-23 DIAGNOSIS — I083 Combined rheumatic disorders of mitral, aortic and tricuspid valves: Secondary | ICD-10-CM | POA: Diagnosis not present

## 2024-01-23 DIAGNOSIS — I480 Paroxysmal atrial fibrillation: Secondary | ICD-10-CM | POA: Diagnosis not present

## 2024-01-23 DIAGNOSIS — I25118 Atherosclerotic heart disease of native coronary artery with other forms of angina pectoris: Secondary | ICD-10-CM | POA: Diagnosis not present

## 2024-01-27 DIAGNOSIS — I25118 Atherosclerotic heart disease of native coronary artery with other forms of angina pectoris: Secondary | ICD-10-CM | POA: Diagnosis not present

## 2024-01-27 DIAGNOSIS — J449 Chronic obstructive pulmonary disease, unspecified: Secondary | ICD-10-CM | POA: Diagnosis not present

## 2024-01-27 DIAGNOSIS — I119 Hypertensive heart disease without heart failure: Secondary | ICD-10-CM | POA: Diagnosis not present

## 2024-01-27 DIAGNOSIS — I083 Combined rheumatic disorders of mitral, aortic and tricuspid valves: Secondary | ICD-10-CM | POA: Diagnosis not present

## 2024-01-27 DIAGNOSIS — T82897D Other specified complication of cardiac prosthetic devices, implants and grafts, subsequent encounter: Secondary | ICD-10-CM | POA: Diagnosis not present

## 2024-01-27 DIAGNOSIS — I480 Paroxysmal atrial fibrillation: Secondary | ICD-10-CM | POA: Diagnosis not present

## 2024-01-28 DIAGNOSIS — T82897D Other specified complication of cardiac prosthetic devices, implants and grafts, subsequent encounter: Secondary | ICD-10-CM | POA: Diagnosis not present

## 2024-01-28 DIAGNOSIS — I480 Paroxysmal atrial fibrillation: Secondary | ICD-10-CM | POA: Diagnosis not present

## 2024-01-28 DIAGNOSIS — I119 Hypertensive heart disease without heart failure: Secondary | ICD-10-CM | POA: Diagnosis not present

## 2024-01-28 DIAGNOSIS — I083 Combined rheumatic disorders of mitral, aortic and tricuspid valves: Secondary | ICD-10-CM | POA: Diagnosis not present

## 2024-01-28 DIAGNOSIS — I25118 Atherosclerotic heart disease of native coronary artery with other forms of angina pectoris: Secondary | ICD-10-CM | POA: Diagnosis not present

## 2024-01-28 DIAGNOSIS — J449 Chronic obstructive pulmonary disease, unspecified: Secondary | ICD-10-CM | POA: Diagnosis not present

## 2024-01-29 DIAGNOSIS — I083 Combined rheumatic disorders of mitral, aortic and tricuspid valves: Secondary | ICD-10-CM | POA: Diagnosis not present

## 2024-01-29 DIAGNOSIS — I480 Paroxysmal atrial fibrillation: Secondary | ICD-10-CM | POA: Diagnosis not present

## 2024-01-29 DIAGNOSIS — J449 Chronic obstructive pulmonary disease, unspecified: Secondary | ICD-10-CM | POA: Diagnosis not present

## 2024-01-29 DIAGNOSIS — T82897D Other specified complication of cardiac prosthetic devices, implants and grafts, subsequent encounter: Secondary | ICD-10-CM | POA: Diagnosis not present

## 2024-01-29 DIAGNOSIS — I119 Hypertensive heart disease without heart failure: Secondary | ICD-10-CM | POA: Diagnosis not present

## 2024-01-29 DIAGNOSIS — I25118 Atherosclerotic heart disease of native coronary artery with other forms of angina pectoris: Secondary | ICD-10-CM | POA: Diagnosis not present

## 2024-02-03 DIAGNOSIS — I119 Hypertensive heart disease without heart failure: Secondary | ICD-10-CM | POA: Diagnosis not present

## 2024-02-03 DIAGNOSIS — I25118 Atherosclerotic heart disease of native coronary artery with other forms of angina pectoris: Secondary | ICD-10-CM | POA: Diagnosis not present

## 2024-02-03 DIAGNOSIS — J449 Chronic obstructive pulmonary disease, unspecified: Secondary | ICD-10-CM | POA: Diagnosis not present

## 2024-02-03 DIAGNOSIS — T82897D Other specified complication of cardiac prosthetic devices, implants and grafts, subsequent encounter: Secondary | ICD-10-CM | POA: Diagnosis not present

## 2024-02-03 DIAGNOSIS — I083 Combined rheumatic disorders of mitral, aortic and tricuspid valves: Secondary | ICD-10-CM | POA: Diagnosis not present

## 2024-02-03 DIAGNOSIS — I480 Paroxysmal atrial fibrillation: Secondary | ICD-10-CM | POA: Diagnosis not present

## 2024-02-05 DIAGNOSIS — I25118 Atherosclerotic heart disease of native coronary artery with other forms of angina pectoris: Secondary | ICD-10-CM | POA: Diagnosis not present

## 2024-02-05 DIAGNOSIS — I119 Hypertensive heart disease without heart failure: Secondary | ICD-10-CM | POA: Diagnosis not present

## 2024-02-05 DIAGNOSIS — I480 Paroxysmal atrial fibrillation: Secondary | ICD-10-CM | POA: Diagnosis not present

## 2024-02-05 DIAGNOSIS — T82897D Other specified complication of cardiac prosthetic devices, implants and grafts, subsequent encounter: Secondary | ICD-10-CM | POA: Diagnosis not present

## 2024-02-05 DIAGNOSIS — J449 Chronic obstructive pulmonary disease, unspecified: Secondary | ICD-10-CM | POA: Diagnosis not present

## 2024-02-05 DIAGNOSIS — I083 Combined rheumatic disorders of mitral, aortic and tricuspid valves: Secondary | ICD-10-CM | POA: Diagnosis not present

## 2024-02-08 DIAGNOSIS — I119 Hypertensive heart disease without heart failure: Secondary | ICD-10-CM | POA: Diagnosis not present

## 2024-02-08 DIAGNOSIS — T82897D Other specified complication of cardiac prosthetic devices, implants and grafts, subsequent encounter: Secondary | ICD-10-CM | POA: Diagnosis not present

## 2024-02-08 DIAGNOSIS — I25118 Atherosclerotic heart disease of native coronary artery with other forms of angina pectoris: Secondary | ICD-10-CM | POA: Diagnosis not present

## 2024-02-08 DIAGNOSIS — J449 Chronic obstructive pulmonary disease, unspecified: Secondary | ICD-10-CM | POA: Diagnosis not present

## 2024-02-08 DIAGNOSIS — I083 Combined rheumatic disorders of mitral, aortic and tricuspid valves: Secondary | ICD-10-CM | POA: Diagnosis not present

## 2024-02-08 DIAGNOSIS — I480 Paroxysmal atrial fibrillation: Secondary | ICD-10-CM | POA: Diagnosis not present

## 2024-02-09 ENCOUNTER — Encounter: Payer: Self-pay | Admitting: Cardiology

## 2024-02-09 ENCOUNTER — Ambulatory Visit: Attending: Cardiology | Admitting: Cardiology

## 2024-02-09 VITALS — BP 106/40 | HR 53 | Ht 63.0 in | Wt 106.0 lb

## 2024-02-09 DIAGNOSIS — Z955 Presence of coronary angioplasty implant and graft: Secondary | ICD-10-CM | POA: Insufficient documentation

## 2024-02-09 DIAGNOSIS — N1831 Chronic kidney disease, stage 3a: Secondary | ICD-10-CM | POA: Diagnosis not present

## 2024-02-09 DIAGNOSIS — R600 Localized edema: Secondary | ICD-10-CM | POA: Insufficient documentation

## 2024-02-09 DIAGNOSIS — J449 Chronic obstructive pulmonary disease, unspecified: Secondary | ICD-10-CM | POA: Diagnosis not present

## 2024-02-09 DIAGNOSIS — R5381 Other malaise: Secondary | ICD-10-CM | POA: Diagnosis not present

## 2024-02-09 DIAGNOSIS — I2510 Atherosclerotic heart disease of native coronary artery without angina pectoris: Secondary | ICD-10-CM | POA: Diagnosis not present

## 2024-02-09 DIAGNOSIS — I214 Non-ST elevation (NSTEMI) myocardial infarction: Secondary | ICD-10-CM | POA: Insufficient documentation

## 2024-02-09 DIAGNOSIS — I3139 Other pericardial effusion (noninflammatory): Secondary | ICD-10-CM | POA: Diagnosis not present

## 2024-02-09 DIAGNOSIS — I480 Paroxysmal atrial fibrillation: Secondary | ICD-10-CM | POA: Insufficient documentation

## 2024-02-09 DIAGNOSIS — E871 Hypo-osmolality and hyponatremia: Secondary | ICD-10-CM | POA: Insufficient documentation

## 2024-02-09 DIAGNOSIS — R61 Generalized hyperhidrosis: Secondary | ICD-10-CM | POA: Insufficient documentation

## 2024-02-09 DIAGNOSIS — J9611 Chronic respiratory failure with hypoxia: Secondary | ICD-10-CM | POA: Diagnosis not present

## 2024-02-09 NOTE — Progress Notes (Unsigned)
 Cardiology Office Note:  .   Date:  02/11/2024  ID:  Kathryn Cuevas, DOB 1931-06-20, MRN [id number] PCP: Okey Carlin Redbird, MD  [facility] Providers Cardiologist:  Alm Clay, MD Cardiology APP:  Rana Lum CROME, NP     Chief Complaint  Patient presents with   Follow-up    Second posthospital follow-up.  Doing better.  Getting more strength   Coronary Artery Disease    No further angina now after LAD and RCA PCI.   Atrial Fibrillation    Denies any rapid irregular heartbeats.  But feels somewhat fatigued    Patient Profile: .     Kathryn Cuevas is a 89 y.o. female with a PMH notable for HLD and multivessel CAD status post two-vessel PCI (RCA atherectomy PCI complicated by dissection and perforation requiring covered stent and pericardiocentesis) who presents here for her second posthospital follow-up .  She presents here today at the request of Okey Carlin Redbird, MD. I only saw her once in 2019, and I was only peripherally familiar with her case in the hospital.  However because I was listed, she was scheduled to see me.  PMH: Two-vessel CAD=> difficult PCI to LAD and RCA with overlapping stents RCA PCI complicated by perforation requiring covered stent as a second procedure along with pericardiocentesis (followed by thoracentesis. PAF in setting of critical illness HTN HLD Persistent hypoxic respiratory failure requiring home oxygen  ---------------- Kathryn Cuevas was admitted to the hospital on October 27, 2023 for evaluation of chest pain felt to be consistent with ACS/non-STEMI with troponins up to 800 along with ischemic T wave changes.  Preliminary catheter revealed proximal and mid LAD stenosis as well as calcific stenosis of the RCA.  She underwent complicated LAD PCI with 2 overlapping stents and was discharged home with plans to return for staged PCI of the RCA for which she returned on November 09, 2023.  Discharged on ASA/Plavix  and low-dose Toprol .  LVEF echo was 45 to  50%.  On 16 June she underwent atherectomy-PCI of the RCA with stent placement that was complicated by perforation of the RCA (accompanied by hypotension and bradycardia requiring Levophed  and brief episode of unresponsiveness requiring CPR).  Unfortunately this led to loss of guidewire positioning and guide wire.  Thankfully, following angioplasty there was excellent RCA flow with no residual perforation.  This was followed by stent placement (4.0 mg 38 mm Synergy XD) with excellent result.  Bedside echo showed no significant effusion so she was transferred to the postprocedure unit.Kathryn Cuevas  Unfortunately she developed progressive hypotension requiring pressor support.  Echo showed moderate pericardial effusion that was new from before and impending tamponade. => She was taken back to the Cath Lab where she underwent pericardiocentesis of about 180 mL bloody fluid with immediate improvement of blood pressure.  Angiography revealed a small pericardial leak but continued patency of the stent proximally.  There was evidence of a distal wire perforation and a small RCA sidebranch that came off distal to the lesion.  As the vessel itself was too small to tolerate a balloon catheter, they decided to place a covered stent crossing that branch (4.0 mm x 15 mm Papyrus Stent) that appropriately sealed the perforation.  However, there is additional complication of a focal dissection upstream of the previous stent that was covered with a second 4.0 x 8 mm Synergy XD upstream from the previously placed 4.0 mm x 38 mm stent.  She was transferred to ICU post procedure  for monitor with pericardial drain in place. Post procedure Echo showed EF 55-60%, RV normal, small-mod effusion over RV free wall and apex.   -  AHF consulted and following since 11/20/23 during CVICU stay, Echo 11/20/23 showed EF 60-65%, no RWMA, mild concentric LVH, RV normal, PASP 34.4 mmHg,  no evidence of cardiac tamponade, TR mod-severe , mild to mod AI, aortic  sclerosis. She required vasopressor support with levophed  and vasopressin .  Timetable: 10/27/2023: Cardiac cath revealing two-vessel CAD-DES PCI x 2 to LAD with plan staged atherectomy with PCI of the RCA 11/19/2023: Staged catheter and PCI of the RCA complicated by focal perforation that appeared to be closed by stent during initial cath (4.0 mm x 38 mm DES placed) -> bedside echo in the Cath Lab showed trivial effusion 11/19/2023: Taken back to the Cath Lab for profound hypotension requiring vasopressor support-Echo showed increased pericardial effusion size with impending tamponade => pericardiocentesis 180 mL fluid; relook cath showed the site of perforation being a small sidebranch the RCA that was covered with a 4.0 mGy 15 mm Papyrus covered stent that occluded the sidebranch and sealed the perforation) => a second Synergy XD 4.0 x 8 mm DES placed upstream of existing stent for guide catheter dissection. => Echo EF was 60 to 65% with no artery remaining.  Moderate to severe TR.  Mild to moderate AI.  Trivial effusion with no tamponade Followed by Advanced Heart Failure Service in the ICU => required Levophed  and vasopressin  Pericardial drain removed 11/21/2023 Developed new onset A-fib RVR on 11/22/2023 => converted to sinus rhythm with IV amiodarone ; no DOAC due to recent pericardial effusion; amiodarone  converted to p.o. and no further A-fib noted Leukocytosis -> initially treated with broad-spectrum antibiotics for possible sepsis; follow-up echocardiogram suggested pleural effusion =>  Left-sided thoracentesis 2050 mL Persistent hypoxia treated with IV Lasix  for volume overload.  Some of the dyspnea was felt to be related to underlying COPD => transition to 20 mg Lasix  on 11/29/2023 and with plans to discharge to SNF on oxygen.  She originally refused SNF. Discharged to SNF on 12/01/2023     Kathryn Cuevas was seen for initial posthospital follow-up by Lum Louis, NP: Denied chest pain with rest  or exertion.  No resting dyspnea but noted fatigue and exertional dyspnea that was improving.  Felt some muscle aches that were presumably related to rosuvastatin .  Noted some improvement having stopped them.  Was living independently and managing her finances prior to this hospitalization and is looking forward to getting back home.  Subjective  Discussed the use of AI scribe software for clinical note transcription with the patient, who gave verbal consent to proceed.  History of Present Illness Kathryn Cuevas is a 89 year old female with coronary artery disease who presents for follow-up after recent cardiac interventions.  She underwent a heart catheterization on October 27, 2023, with the placement of two stents. She was discharged on October 30, 2023, but required a more complex procedure at the end of June due to complications such as downstream artery closure, a tear in the artery lining, and a micro perforation. These were managed with additional stents and a drain for pericardial effusion. She experienced pericardial tamponade and cardiac arrest, necessitating further intervention.  She now has been discharged from the skilled nursing facility/rehab center and is living at home.  She is accompanied by her caregiver who helps out with a lot of the history.  Since these events, there has been no  recurrence of angina symptoms, chest pain, or pressure. She is currently on supplemental oxygen as her levels drop to 84% without it. She is undergoing physical therapy to regain strength and reports improved energy levels, though her stamina remains low. She uses a walker for mobility and is working on regaining independence in daily activities.  Her current medications include amiodarone  every other day, Plavix , and aspirin  for her stents, and furosemide  to prevent fluid retention in her lungs. Her heart rate has been in the fifties.  No major issues with swelling, heart racing, or dizziness. She sleeps on one  pillow and does not wake up short of breath. Her oxygen levels are monitored regularly, and she uses a spirometer to aid lung recovery.  Her caregiver notes that she is a fall risk, and she is considering additional support from an agency to ensure continuous care. She wants to remain in her home and regain her independence.  Cardiovascular ROS: positive for - dyspnea on exertion, edema, shortness of breath, and she is quite deconditioned and fatigued.  Slowly regaining her strength and conditioning.  Still quite weak.  Notes slow heart rate and generalized fatigue/malaise edema well-controlled negative for - chest pain, irregular heartbeat, orthopnea, palpitations, paroxysmal nocturnal dyspnea, rapid heart rate, or syncope or near syncope TIA or amaurosis fugax, claudication  ROS:  Review of Systems - Negative except symptoms noted above.  She is quite weak and deconditioned and has an unsteady gait.  Still a fall risk.    Objective   Current Medications: Amiodarone  20 mg daily; aspirin  81 mg daily/clopidogrel  25 mg daily; furosemide  20 mg daily; Synthroid  88 mcg daily  Studies Reviewed: .       Kathryn Cuevas Lab Results  Component Value Date   NA 130 (L) 12/01/2023   K 4.1 12/01/2023   CREATININE 0.81 12/01/2023   EGFR 82.0 12/08/2023   GLUCOSE 109 (H) 12/01/2023      Latest Ref Rng & Units 12/01/2023    3:28 AM 11/30/2023    2:25 AM 11/29/2023    3:18 AM  CBC  WBC 4.0 - 10.5 K/uL 16.5  14.5  14.0   Hemoglobin 12.0 - 15.0 g/dL 89.9  89.8  89.4   Hematocrit 36.0 - 46.0 % 30.2  31.1  30.4   Platelets 150 - 400 K/uL 452  429  373    No results found for: CHOL, HDL, LDLCALC, LDLDIRECT, TRIG, CHOLHDL No results found for: YHAJ8R Results LABS 12/08/2023: 13.5, Cr 0.71.  NA 132, K+ 3.8.;  WBC 6.6; Hgb 9.2; PLT 417  Diagnostic Studies: Echo  10/28/2023: EF 45 to 50%.  Apical kinesis anteroseptal hypokinesis.  Normal RV size and function.  Moderate MAC with no MS or MR.  AV sclerosis  with no stenosis.  Normal RAP. Serial Echocardiograms  11/19/2023: EF 55 to 60%.  Small to moderate effusion over the RV free wall and apex. => Repeat echo post pericardiocentesis with drain showed trivial effusion with EF 60 to 65%. 11/20/2023: EF 60 to 65%.  No RWMA.  No evidence of tamponade.  Moderate to severe TR AV sclerosis.  RAP estimated 15 mmHg.. (Final) 11/22/2023: EF 7075%.  No RWMA.  Moderate TR.  AOV sclerosis no stenosis.  Normal RAP.  No pericardial effusion. Coronary angiography & intervention 10/27/2023: LM: Normal; LAD: Moderately calcific prox-mid LAD 80-95% stenoses,  Diag 2 ostial 70% stenosis; Lcx: Mid 40% disease; RCA: Proximal 80% stenosis; LVEDP 4 mmHg Successful percutaneous coronary intervention prox-mid LAD: PTCA and overlapping  stents placement 2.5 X 20 mm & 2.75 X 12 mm Synergy drug-eluting stents= > Post dilatation with 2.75 & 3.0 mm [place] balloons up to 18 atm (11/19/23): Staged CSI atherectomy and PCI of RCA with 4.0 x 38 mm Synergy DES => followed by relook catheterization for perforation with paracentesis second Synergy 4 oh by 8 mm DES and a papyrus 4.0 x 15 mm stent placed covering dissection/perforation & 4.0 X 8 Synergy Ost-Prox) Diagnostic: Dominance: Right        Intervention #1         CSI-PCI 4.0 x 38 mm    Intervention-pericardiocentesis with coverage of dissection/perforation of RCA ( Papyrus 4.0 x 15 & 4.0 x 8 ostial)        Risk Assessment/Calculations:    CHA2DS2-VASc Score = 6   This indicates a 9.7% annual risk of stroke. The patient's score is based upon: CHF History: 1 HTN History: 1 Diabetes History: 0 Stroke History: 0 Vascular Disease History: 1 Age Score: 2 Gender Score: 1  NOT ON DOAC         Physical Exam:   VS:  BP (!) 106/40   Pulse (!) 53   Ht 5' 3 (1.6 m)   Wt 106 lb (48.1 kg)   SpO2 95%   BMI 18.78 kg/m    Wt Readings from Last 3 Encounters:  02/09/24 106 lb (48.1 kg)  12/07/23 109 lb (49.4 kg)  12/01/23 111 lb 5.3  oz (50.5 kg)      GEN: Somewhat frail and elderly woman, well groomed in no acute distress; nontoxic appearing.  She is using supplemental oxygen and seems somewhat weak, in a wheelchair but is apparently able to walk NECK: No JVD; No carotid bruits CARDIAC: Normal S1, S2; RRR, no murmurs, rubs, gallops RESPIRATORY:  Clear to auscultation without rales, wheezing or rhonchi ; nonlabored, diminished breath sounds in the bases left greater than right.  Clears with cough. ABDOMEN: Soft, non-tender, non-distended EXTREMITIES:  No edema; No deformity      ASSESSMENT AND PLAN: .    Problem List Items Addressed This Visit       Cardiology Problems   Atrial fibrillation (HCC) (Chronic)   Paroxysmal atrial fibrillation converted with IV amiodarone .  Now remains on oral amiodarone , maintaining sinus rhythm/sinus bradycardia.  Heart rate in 50s due to medication. - Discontinue amiodarone  by taking it every other day until the current prescription is finished. - After six weeks off amiodarone , wear a heart monitor for two weeks to check for atrial fibrillation. - Follow up after the monitoring period to assess for any recurrence of atrial fibrillation.  - Unless there is recurrence of A-fib, I would try to avoid DOAC.      Relevant Orders   LONG TERM MONITOR (3-14 DAYS)   Coronary artery disease involving native coronary artery of native heart without angina pectoris - Primary (Chronic)   No further angina after RCA and LAD PCI. - Now with extensive PCI remains on aspirin  and Plavix  which are planned to continue uninterrupted for at least 6 months.  We can stop aspirin  in 6 months and continue Plavix  long-term. - Her blood pressures are still too low with no room to consider beta-blocker or ARB especially with heart rate of 53 bpm. - Will plan to DC amiodarone  to allow for some heart rate responsiveness and help with fatigue.  May consider beta-blocker after - Continue atorvastatin  which she  seems to be doing better with now.  Low  threshold to convert to rosuvastatin  if she has more intolerance.      NSTEMI (non-ST elevated myocardial infarction) (HCC) (Chronic)   She did have elevated elevated troponin in the setting of her ACS but not significant and no significant medical damage done. Extensive PCI performed with improvement in EF on echocardiogram.      Pericardial effusion (Chronic)   Pericardial effusion in the setting of RCA perforation treated with pericardiocentesis.  Several repeat echocardiogram showed no evidence of effusion. No symptoms to suggest recurrence.  No symptoms of Dressler syndrome.        Other   Bilateral lower extremity edema   Edema likely due to poor venous drainage and recent cardiac events. No heart failure evidence. - Consider use of furosemide  as needed for fluid management.      Chronic kidney disease, stage 3a (HCC) (Chronic)   Chronic kidney disease stage 3 present. Monitor kidney function, especially with recent cardiac events and medication use. - Adjust medications as needed based on kidney function.  Most recent renal function was notably improved.      Chronic respiratory failure with hypoxia (HCC) (Chronic)   Hyponatremia (Chronic)   Hyponatremia likely due to recent illness and fluid management. - Monitor sodium levels regularly. - Adjust fluid intake as needed to maintain sodium balance.      Physical deconditioning (Chronic)   Deconditioning and reduced stamina present post-hospitalization. Physical therapy ongoing. Using walker for mobility, regaining independence. - Continue physical therapy to improve strength and stamina. - Encourage regular walking and activity as tolerated.   We had a long discussion mostly instigated by her caregiver discussing plans for her ongoing care as she has indicated that she would not want to go to skilled nursing facility.  They are trying to figure out ways to have home health come and  help out supplement her support.  They were concerned about her ability to recover and we spent quite a bit of time talking about this.      S/P coronary artery stent placement (Chronic)   Status post percutaneous coronary intervention with multiple stents, complicated by pericardial tamponade and pericardial effusion, now resolved Underwent PCI with multiple stents, complicated by pericardial tamponade and effusion, now resolved. Hyperdynamic heart function with EF 70-75%. No angina or heart failure symptoms. - Continue aspirin  and Plavix  for stent protection.=> If necessary, could potentially hold for procedures or surgeries as of January 2026. - Will plan to stop aspirin  at that time, and continue Plavix  long-term.      Unexplained night sweats   Night sweats, fever, and myalgias under evaluation. Possible recent infection or systemic issue. - Follow up with primary care physician for further evaluation of symptoms. - Consider additional testing if symptoms persist.             Follow-Up: Return in about 3 months (around 05/10/2024) for Routine follow up with me.  I spent 128 minutes in the care of Kathryn Cuevas today including reviewing labs (3 minutes reviewing labs from epic and then also from Care Everywhere and [facility]), reviewing studies (several echocardiograms reviewed including the initial tamponade images and follow-up in follow-up echo along with all 3 cardiac cath procedures/films and reports 15 minutes), face to face time discussing treatment options (69 minutes), reviewing records from her 2 hospitalizations most notably the complicated hospitalization after pericardial Deasis as well as follow-up clinic note (20 minutes), 17 minutes dictating, and documenting in the encounter.      Bonney Lenis  MICAEL Clay, MD, MS Alm Clay, M.D., M.S. Interventional Cardiologist  [facility] Pager # [phone]

## 2024-02-09 NOTE — Patient Instructions (Addendum)
 Medication Instructions:    Take Amiodarone  every other day , once completed please call the office or senda mychart message. - for monitor to be mailed to you 6 weeks  *If you need a refill on your cardiac medications before your next appointment, please call your pharmacy*   Lab Work: Not needed If you have labs (blood work) drawn today and your tests are completely normal, you will receive your results only by: MyChart Message (if you have MyChart) OR A paper copy in the mail If you have any lab test that is abnormal or we need to change your treatment, we will call you to review the results.   Testing/Procedures: Your physician has recommended that you wear a holter monitor  days  Zio . Holter monitors are medical devices that record the heart's electrical activity. Doctors most often use these monitors to diagnose arrhythmias. Arrhythmias are problems with the speed or rhythm of the heartbeat. The monitor is a small, portable device. You can wear one while you do your normal daily activities. This is usually used to diagnose what is causing palpitations/syncope (passing out).    Follow-Up: At [facility], you and your health needs are our priority.  As part of our continuing mission to provide you with exceptional heart care, we have created designated Provider Care Teams.  These Care Teams include your primary Cardiologist (physician) and Advanced Practice Providers (APPs -  Physician Assistants and Nurse Practitioners) who all work together to provide you with the care you need, when you need it.     Your next appointment:     12 week(s)  The format for your next appointment:   In Person  Provider:   Alm Clay, MD   Other Instructions   ZIO XT- Long Term Monitor Instructions  Your physician has requested you wear a ZIO patch monitor for 14 days.  This is a single patch monitor. [employer] supplies one patch monitor per enrollment. Additional stickers are not  available. Please do not apply patch if you will be having a Nuclear Stress Test,  Echocardiogram, Cardiac CT, MRI, or Chest Xray during the period you would be wearing the  monitor. The patch cannot be worn during these tests. You cannot remove and re-apply the  ZIO XT patch monitor.  Your ZIO patch monitor will be mailed 3 day USPS to your address on file. It may take 3-5 days  to receive your monitor after you have been enrolled.  Once you have received your monitor, please review the enclosed instructions. Your monitor  has already been registered assigning a specific monitor serial # to you.  Billing and Patient Assistance Program Information  We have supplied [employer] with any of your insurance information on file for billing purposes. [employer] offers a sliding scale Patient Assistance Program for patients that do not have  insurance, or whose insurance does not completely cover the cost of the ZIO monitor.  You must apply for the Patient Assistance Program to qualify for this discounted rate.  To apply, please call [employer] at [phone], select option 4, select option 2, ask to apply for  Patient Assistance Program. Meredeth will ask your household income, and how many people  are in your household. They will quote your out-of-pocket cost based on that information.  [employer] will also be able to set up a 67-month, interest-free payment plan if needed.  Applying the monitor   Shave hair from upper left chest.  Hold abrader disc by orange tab.  Rub abrader in 40 strokes over the upper left chest as  indicated in your monitor instructions.  Clean area with 4 enclosed alcohol pads. Let dry.  Apply patch as indicated in monitor instructions. Patch will be placed under collarbone on left  side of chest with arrow pointing upward.  Rub patch adhesive wings for 2 minutes. Remove white label marked 1. Remove the white  label marked 2. Rub patch adhesive wings for 2 additional minutes.   While looking in a mirror, press and release button in center of patch. A small green light will  flash 3-4 times. This will be your only indicator that the monitor has been turned on.  Do not shower for the first 24 hours. You may shower after the first 24 hours.  Press the button if you feel a symptom. You will hear a small click. Record Date, Time and  Symptom in the Patient Logbook.  When you are ready to remove the patch, follow instructions on the last 2 pages of Patient  Logbook. Stick patch monitor onto the last page of Patient Logbook.  Place Patient Logbook in the blue and white box. Use locking tab on box and tape box closed  securely. The blue and white box has prepaid postage on it. Please place it in the mailbox as  soon as possible. Your physician should have your test results approximately 7 days after the  monitor has been mailed back to [facility]  Call [facility] Customer Care at [phone] if you have questions regarding  your ZIO XT patch monitor. Call them immediately if you see an orange light blinking on your  monitor.  If your monitor falls off in less than 4 days, contact our Monitor department at [phone].  If your monitor becomes loose or falls off after 4 days call [employer] at [phone] for  suggestions on securing your monitor

## 2024-02-10 ENCOUNTER — Encounter: Payer: Self-pay | Admitting: Cardiology

## 2024-02-10 ENCOUNTER — Telehealth: Payer: Self-pay | Admitting: Cardiology

## 2024-02-10 NOTE — Telephone Encounter (Signed)
*  STAT* If patient is at the pharmacy, call can be transferred to refill team.   1. Which medications need to be refilled? (please list name of each medication and dose if known)   aspirin  EC 81 MG tablet  clopidogrel  (PLAVIX ) 75 MG tablet  aspirin  EC 81 MG tablet  furosemide  (LASIX ) 20 MG tablet   2. Would you like to learn more about the convenience, safety, & potential cost savings by using the [facility]?   3. Are you open to using the [facility] (Type [facility]. ).  4. Which pharmacy/location (including street and city if local pharmacy) is medication to be sent to?  [id number] - [place], [place] - [address] AT [facility] [address]   5. Do they need a 30 day or 90 day supply?   90 day  Caller Sherrie) stated patient still has some medications.

## 2024-02-11 ENCOUNTER — Encounter: Payer: Self-pay | Admitting: *Deleted

## 2024-02-11 ENCOUNTER — Encounter: Payer: Self-pay | Admitting: Cardiology

## 2024-02-11 DIAGNOSIS — R61 Generalized hyperhidrosis: Secondary | ICD-10-CM | POA: Insufficient documentation

## 2024-02-11 DIAGNOSIS — N1831 Chronic kidney disease, stage 3a: Secondary | ICD-10-CM | POA: Insufficient documentation

## 2024-02-11 DIAGNOSIS — R5381 Other malaise: Secondary | ICD-10-CM

## 2024-02-11 DIAGNOSIS — I480 Paroxysmal atrial fibrillation: Secondary | ICD-10-CM | POA: Insufficient documentation

## 2024-02-11 DIAGNOSIS — E871 Hypo-osmolality and hyponatremia: Secondary | ICD-10-CM | POA: Insufficient documentation

## 2024-02-11 DIAGNOSIS — R600 Localized edema: Secondary | ICD-10-CM | POA: Insufficient documentation

## 2024-02-11 DIAGNOSIS — I4891 Unspecified atrial fibrillation: Secondary | ICD-10-CM | POA: Insufficient documentation

## 2024-02-11 HISTORY — DX: Other malaise: R53.81

## 2024-02-11 MED ORDER — ASPIRIN 81 MG PO TBEC: 81.0000 mg | DELAYED_RELEASE_TABLET | Freq: Every day | ORAL | 3 refills | Status: AC

## 2024-02-11 MED ORDER — ATORVASTATIN CALCIUM 80 MG PO TABS: 80.0000 mg | ORAL_TABLET | Freq: Every day | ORAL | 3 refills | Status: DC

## 2024-02-11 MED ORDER — ATORVASTATIN CALCIUM 80 MG PO TABS: 80.0000 mg | ORAL_TABLET | Freq: Every day | ORAL | 3 refills | Status: AC

## 2024-02-11 MED ORDER — CLOPIDOGREL BISULFATE 75 MG PO TABS: 75.0000 mg | ORAL_TABLET | Freq: Every day | ORAL | 2 refills | Status: AC

## 2024-02-11 MED ORDER — ASPIRIN 81 MG PO TBEC: 81.0000 mg | DELAYED_RELEASE_TABLET | Freq: Every day | ORAL | 3 refills | Status: DC

## 2024-02-11 MED ORDER — FUROSEMIDE 20 MG PO TABS: 20.0000 mg | ORAL_TABLET | Freq: Every day | ORAL | 3 refills | Status: AC

## 2024-02-11 MED ORDER — FUROSEMIDE 20 MG PO TABS: 20.0000 mg | ORAL_TABLET | Freq: Every day | ORAL | 3 refills | Status: DC

## 2024-02-11 NOTE — Assessment & Plan Note (Signed)
 Pericardial effusion in the setting of RCA perforation treated with pericardiocentesis.  Several repeat echocardiogram showed no evidence of effusion. No symptoms to suggest recurrence.  No symptoms of Dressler syndrome.

## 2024-02-11 NOTE — Assessment & Plan Note (Signed)
 Night sweats, fever, and myalgias under evaluation. Possible recent infection or systemic issue. - Follow up with primary care physician for further evaluation of symptoms. - Consider additional testing if symptoms persist.

## 2024-02-11 NOTE — Telephone Encounter (Signed)
 Dr Anner will sign for refills.

## 2024-02-11 NOTE — Assessment & Plan Note (Signed)
 Paroxysmal atrial fibrillation converted with IV amiodarone .  Now remains on oral amiodarone , maintaining sinus rhythm/sinus bradycardia.  Heart rate in 50s due to medication. - Discontinue amiodarone  by taking it every other day until the current prescription is finished. - After six weeks off amiodarone , wear a heart monitor for two weeks to check for atrial fibrillation. - Follow up after the monitoring period to assess for any recurrence of atrial fibrillation.  - Unless there is recurrence of A-fib, I would try to avoid DOAC.

## 2024-02-11 NOTE — Assessment & Plan Note (Signed)
 Chronic kidney disease stage 3 present. Monitor kidney function, especially with recent cardiac events and medication use. - Adjust medications as needed based on kidney function.  Most recent renal function was notably improved.

## 2024-02-11 NOTE — Assessment & Plan Note (Signed)
 No further angina after RCA and LAD PCI. - Now with extensive PCI remains on aspirin  and Plavix  which are planned to continue uninterrupted for at least 6 months.  We can stop aspirin  in 6 months and continue Plavix  long-term. - Her blood pressures are still too low with no room to consider beta-blocker or ARB especially with heart rate of 53 bpm. - Will plan to DC amiodarone  to allow for some heart rate responsiveness and help with fatigue.  May consider beta-blocker after - Continue atorvastatin  which she seems to be doing better with now.  Low threshold to convert to rosuvastatin  if she has more intolerance.

## 2024-02-11 NOTE — Assessment & Plan Note (Addendum)
 Deconditioning and reduced stamina present post-hospitalization. Physical therapy ongoing. Using walker for mobility, regaining independence. - Continue physical therapy to improve strength and stamina. - Encourage regular walking and activity as tolerated.   We had a long discussion mostly instigated by her caregiver discussing plans for her ongoing care as she has indicated that she would not want to go to skilled nursing facility.  They are trying to figure out ways to have home health come and help out supplement her support.  They were concerned about her ability to recover and we spent quite a bit of time talking about this.

## 2024-02-11 NOTE — Assessment & Plan Note (Signed)
 She did have elevated elevated troponin in the setting of her ACS but not significant and no significant medical damage done. Extensive PCI performed with improvement in EF on echocardiogram.

## 2024-02-11 NOTE — Assessment & Plan Note (Signed)
 Status post percutaneous coronary intervention with multiple stents, complicated by pericardial tamponade and pericardial effusion, now resolved Underwent PCI with multiple stents, complicated by pericardial tamponade and effusion, now resolved. Hyperdynamic heart function with EF 70-75%. No angina or heart failure symptoms. - Continue aspirin  and Plavix  for stent protection.=> If necessary, could potentially hold for procedures or surgeries as of January 2026. - Will plan to stop aspirin  at that time, and continue Plavix  long-term.

## 2024-02-11 NOTE — Assessment & Plan Note (Signed)
 Edema likely due to poor venous drainage and recent cardiac events. No heart failure evidence. - Consider use of furosemide  as needed for fluid management.

## 2024-02-11 NOTE — Assessment & Plan Note (Signed)
 Hyponatremia likely due to recent illness and fluid management. - Monitor sodium levels regularly. - Adjust fluid intake as needed to maintain sodium balance.

## 2024-02-12 DIAGNOSIS — J449 Chronic obstructive pulmonary disease, unspecified: Secondary | ICD-10-CM | POA: Diagnosis not present

## 2024-02-12 DIAGNOSIS — I119 Hypertensive heart disease without heart failure: Secondary | ICD-10-CM | POA: Diagnosis not present

## 2024-02-12 DIAGNOSIS — I25118 Atherosclerotic heart disease of native coronary artery with other forms of angina pectoris: Secondary | ICD-10-CM | POA: Diagnosis not present

## 2024-02-12 DIAGNOSIS — I480 Paroxysmal atrial fibrillation: Secondary | ICD-10-CM | POA: Diagnosis not present

## 2024-02-12 DIAGNOSIS — I083 Combined rheumatic disorders of mitral, aortic and tricuspid valves: Secondary | ICD-10-CM | POA: Diagnosis not present

## 2024-02-12 DIAGNOSIS — T82897D Other specified complication of cardiac prosthetic devices, implants and grafts, subsequent encounter: Secondary | ICD-10-CM | POA: Diagnosis not present

## 2024-02-13 DIAGNOSIS — D531 Other megaloblastic anemias, not elsewhere classified: Secondary | ICD-10-CM | POA: Diagnosis not present

## 2024-02-13 DIAGNOSIS — Z955 Presence of coronary angioplasty implant and graft: Secondary | ICD-10-CM | POA: Diagnosis not present

## 2024-02-13 DIAGNOSIS — I480 Paroxysmal atrial fibrillation: Secondary | ICD-10-CM | POA: Diagnosis not present

## 2024-02-13 DIAGNOSIS — E785 Hyperlipidemia, unspecified: Secondary | ICD-10-CM | POA: Diagnosis not present

## 2024-02-13 DIAGNOSIS — Z720 Tobacco use: Secondary | ICD-10-CM | POA: Diagnosis not present

## 2024-02-13 DIAGNOSIS — I252 Old myocardial infarction: Secondary | ICD-10-CM | POA: Diagnosis not present

## 2024-02-13 DIAGNOSIS — I083 Combined rheumatic disorders of mitral, aortic and tricuspid valves: Secondary | ICD-10-CM | POA: Diagnosis not present

## 2024-02-13 DIAGNOSIS — Z7902 Long term (current) use of antithrombotics/antiplatelets: Secondary | ICD-10-CM | POA: Diagnosis not present

## 2024-02-13 DIAGNOSIS — R7303 Prediabetes: Secondary | ICD-10-CM | POA: Diagnosis not present

## 2024-02-13 DIAGNOSIS — I25118 Atherosclerotic heart disease of native coronary artery with other forms of angina pectoris: Secondary | ICD-10-CM | POA: Diagnosis not present

## 2024-02-13 DIAGNOSIS — J309 Allergic rhinitis, unspecified: Secondary | ICD-10-CM | POA: Diagnosis not present

## 2024-02-13 DIAGNOSIS — T82897D Other specified complication of cardiac prosthetic devices, implants and grafts, subsequent encounter: Secondary | ICD-10-CM | POA: Diagnosis not present

## 2024-02-13 DIAGNOSIS — K5900 Constipation, unspecified: Secondary | ICD-10-CM | POA: Diagnosis not present

## 2024-02-13 DIAGNOSIS — E039 Hypothyroidism, unspecified: Secondary | ICD-10-CM | POA: Diagnosis not present

## 2024-02-13 DIAGNOSIS — I119 Hypertensive heart disease without heart failure: Secondary | ICD-10-CM | POA: Diagnosis not present

## 2024-02-13 DIAGNOSIS — J449 Chronic obstructive pulmonary disease, unspecified: Secondary | ICD-10-CM | POA: Diagnosis not present

## 2024-02-13 DIAGNOSIS — M6281 Muscle weakness (generalized): Secondary | ICD-10-CM | POA: Diagnosis not present

## 2024-02-16 DIAGNOSIS — T82897D Other specified complication of cardiac prosthetic devices, implants and grafts, subsequent encounter: Secondary | ICD-10-CM | POA: Diagnosis not present

## 2024-02-16 DIAGNOSIS — I480 Paroxysmal atrial fibrillation: Secondary | ICD-10-CM | POA: Diagnosis not present

## 2024-02-16 DIAGNOSIS — I083 Combined rheumatic disorders of mitral, aortic and tricuspid valves: Secondary | ICD-10-CM | POA: Diagnosis not present

## 2024-02-16 DIAGNOSIS — I119 Hypertensive heart disease without heart failure: Secondary | ICD-10-CM | POA: Diagnosis not present

## 2024-02-16 DIAGNOSIS — J449 Chronic obstructive pulmonary disease, unspecified: Secondary | ICD-10-CM | POA: Diagnosis not present

## 2024-02-16 DIAGNOSIS — I25118 Atherosclerotic heart disease of native coronary artery with other forms of angina pectoris: Secondary | ICD-10-CM | POA: Diagnosis not present

## 2024-02-17 DIAGNOSIS — I480 Paroxysmal atrial fibrillation: Secondary | ICD-10-CM | POA: Diagnosis not present

## 2024-02-17 DIAGNOSIS — T82897D Other specified complication of cardiac prosthetic devices, implants and grafts, subsequent encounter: Secondary | ICD-10-CM | POA: Diagnosis not present

## 2024-02-17 DIAGNOSIS — I083 Combined rheumatic disorders of mitral, aortic and tricuspid valves: Secondary | ICD-10-CM | POA: Diagnosis not present

## 2024-02-17 DIAGNOSIS — I25118 Atherosclerotic heart disease of native coronary artery with other forms of angina pectoris: Secondary | ICD-10-CM | POA: Diagnosis not present

## 2024-02-17 DIAGNOSIS — I119 Hypertensive heart disease without heart failure: Secondary | ICD-10-CM | POA: Diagnosis not present

## 2024-02-17 DIAGNOSIS — J449 Chronic obstructive pulmonary disease, unspecified: Secondary | ICD-10-CM | POA: Diagnosis not present

## 2024-02-19 ENCOUNTER — Telehealth: Payer: Self-pay | Admitting: Cardiology

## 2024-02-19 NOTE — Telephone Encounter (Signed)
 Called Kathryn Cuevas ok per DPR.  She received a refill of atorvastatin  80 mg.  Pt has taken 40 mg as long as she can remember.   Would like to know what dose of atorvastatin  to take and if pt needs to have labs drawn.  Reports doesn't think pt had labs with PCP.  Advised will send to MD to review.

## 2024-02-19 NOTE — Telephone Encounter (Signed)
 Pt c/o medication issue:  1. Name of Medication:   atorvastatin  (LIPITOR ) 80 MG tablet    2. How are you currently taking this medication (dosage and times per day)?  Take 1 tablet (80 mg total) by mouth daily.      3. Are you having a reaction (difficulty breathing--STAT)? No  4. What is your medication issue? Pt's neighbor [name] is requesting a callback regarding her wanting to know if pt should be taking 40mg  or 80mg  of this medication since there was an increase to 80mg  but pt hadn't has labs done. Please advise

## 2024-02-20 DIAGNOSIS — J449 Chronic obstructive pulmonary disease, unspecified: Secondary | ICD-10-CM | POA: Diagnosis not present

## 2024-02-20 DIAGNOSIS — I25118 Atherosclerotic heart disease of native coronary artery with other forms of angina pectoris: Secondary | ICD-10-CM | POA: Diagnosis not present

## 2024-02-20 DIAGNOSIS — T82897D Other specified complication of cardiac prosthetic devices, implants and grafts, subsequent encounter: Secondary | ICD-10-CM | POA: Diagnosis not present

## 2024-02-20 DIAGNOSIS — I119 Hypertensive heart disease without heart failure: Secondary | ICD-10-CM | POA: Diagnosis not present

## 2024-02-20 DIAGNOSIS — I480 Paroxysmal atrial fibrillation: Secondary | ICD-10-CM | POA: Diagnosis not present

## 2024-02-20 DIAGNOSIS — I083 Combined rheumatic disorders of mitral, aortic and tricuspid valves: Secondary | ICD-10-CM | POA: Diagnosis not present

## 2024-02-23 DIAGNOSIS — E039 Hypothyroidism, unspecified: Secondary | ICD-10-CM | POA: Diagnosis not present

## 2024-02-23 DIAGNOSIS — T82897D Other specified complication of cardiac prosthetic devices, implants and grafts, subsequent encounter: Secondary | ICD-10-CM | POA: Diagnosis not present

## 2024-02-23 DIAGNOSIS — I083 Combined rheumatic disorders of mitral, aortic and tricuspid valves: Secondary | ICD-10-CM | POA: Diagnosis not present

## 2024-02-23 DIAGNOSIS — I480 Paroxysmal atrial fibrillation: Secondary | ICD-10-CM | POA: Diagnosis not present

## 2024-02-23 DIAGNOSIS — E7800 Pure hypercholesterolemia, unspecified: Secondary | ICD-10-CM | POA: Diagnosis not present

## 2024-02-23 DIAGNOSIS — J449 Chronic obstructive pulmonary disease, unspecified: Secondary | ICD-10-CM | POA: Diagnosis not present

## 2024-02-23 DIAGNOSIS — I119 Hypertensive heart disease without heart failure: Secondary | ICD-10-CM | POA: Diagnosis not present

## 2024-02-23 DIAGNOSIS — I25118 Atherosclerotic heart disease of native coronary artery with other forms of angina pectoris: Secondary | ICD-10-CM | POA: Diagnosis not present

## 2024-02-23 NOTE — Telephone Encounter (Signed)
 The prescription was for 80 mg daily.  That was prescribed during her previous hospitalization when she came in and had MI and PCI.  The reason for the higher dose is that she had the MI and we go to the higher dose.  We can see what her lipids look like in the future and potentially adjust dosing, but if she is able to tolerate the 80 mg there is benefit to that but if she is not then we can go back down in follow-up visits.  But for now if she is able to tolerate 80, then continue doing so.   Alm Clay, MD

## 2024-02-25 DIAGNOSIS — I119 Hypertensive heart disease without heart failure: Secondary | ICD-10-CM | POA: Diagnosis not present

## 2024-02-25 DIAGNOSIS — I25118 Atherosclerotic heart disease of native coronary artery with other forms of angina pectoris: Secondary | ICD-10-CM | POA: Diagnosis not present

## 2024-02-25 DIAGNOSIS — T82897D Other specified complication of cardiac prosthetic devices, implants and grafts, subsequent encounter: Secondary | ICD-10-CM | POA: Diagnosis not present

## 2024-02-25 DIAGNOSIS — I480 Paroxysmal atrial fibrillation: Secondary | ICD-10-CM | POA: Diagnosis not present

## 2024-02-25 DIAGNOSIS — J449 Chronic obstructive pulmonary disease, unspecified: Secondary | ICD-10-CM | POA: Diagnosis not present

## 2024-02-25 DIAGNOSIS — I083 Combined rheumatic disorders of mitral, aortic and tricuspid valves: Secondary | ICD-10-CM | POA: Diagnosis not present

## 2024-02-25 NOTE — Telephone Encounter (Signed)
 Daughter   is aware the is to take 80 mg  She states she reviewed patient information from facility and patient has been taking 80 mg dose

## 2024-02-29 DIAGNOSIS — J449 Chronic obstructive pulmonary disease, unspecified: Secondary | ICD-10-CM | POA: Diagnosis not present

## 2024-02-29 DIAGNOSIS — I480 Paroxysmal atrial fibrillation: Secondary | ICD-10-CM | POA: Diagnosis not present

## 2024-02-29 DIAGNOSIS — I119 Hypertensive heart disease without heart failure: Secondary | ICD-10-CM | POA: Diagnosis not present

## 2024-02-29 DIAGNOSIS — I083 Combined rheumatic disorders of mitral, aortic and tricuspid valves: Secondary | ICD-10-CM | POA: Diagnosis not present

## 2024-02-29 DIAGNOSIS — I25118 Atherosclerotic heart disease of native coronary artery with other forms of angina pectoris: Secondary | ICD-10-CM | POA: Diagnosis not present

## 2024-02-29 DIAGNOSIS — T82897D Other specified complication of cardiac prosthetic devices, implants and grafts, subsequent encounter: Secondary | ICD-10-CM | POA: Diagnosis not present

## 2024-03-01 DIAGNOSIS — T82897D Other specified complication of cardiac prosthetic devices, implants and grafts, subsequent encounter: Secondary | ICD-10-CM | POA: Diagnosis not present

## 2024-03-01 DIAGNOSIS — I480 Paroxysmal atrial fibrillation: Secondary | ICD-10-CM | POA: Diagnosis not present

## 2024-03-01 DIAGNOSIS — J449 Chronic obstructive pulmonary disease, unspecified: Secondary | ICD-10-CM | POA: Diagnosis not present

## 2024-03-01 DIAGNOSIS — I119 Hypertensive heart disease without heart failure: Secondary | ICD-10-CM | POA: Diagnosis not present

## 2024-03-01 DIAGNOSIS — I083 Combined rheumatic disorders of mitral, aortic and tricuspid valves: Secondary | ICD-10-CM | POA: Diagnosis not present

## 2024-03-01 DIAGNOSIS — I25118 Atherosclerotic heart disease of native coronary artery with other forms of angina pectoris: Secondary | ICD-10-CM | POA: Diagnosis not present

## 2024-03-03 DIAGNOSIS — I25118 Atherosclerotic heart disease of native coronary artery with other forms of angina pectoris: Secondary | ICD-10-CM | POA: Diagnosis not present

## 2024-03-03 DIAGNOSIS — I083 Combined rheumatic disorders of mitral, aortic and tricuspid valves: Secondary | ICD-10-CM | POA: Diagnosis not present

## 2024-03-03 DIAGNOSIS — I480 Paroxysmal atrial fibrillation: Secondary | ICD-10-CM | POA: Diagnosis not present

## 2024-03-03 DIAGNOSIS — I119 Hypertensive heart disease without heart failure: Secondary | ICD-10-CM | POA: Diagnosis not present

## 2024-03-03 DIAGNOSIS — T82897D Other specified complication of cardiac prosthetic devices, implants and grafts, subsequent encounter: Secondary | ICD-10-CM | POA: Diagnosis not present

## 2024-03-03 DIAGNOSIS — J449 Chronic obstructive pulmonary disease, unspecified: Secondary | ICD-10-CM | POA: Diagnosis not present

## 2024-03-04 ENCOUNTER — Ambulatory Visit
Admission: RE | Admit: 2024-03-04 | Discharge: 2024-03-04 | Disposition: A | Discharge status: Home / Self Care | Source: Ambulatory Visit | Attending: Internal Medicine | Admitting: Internal Medicine

## 2024-03-04 VITALS — BP 121/72 | HR 60 | Temp 97.8°F | Resp 17

## 2024-03-04 DIAGNOSIS — H6121 Impacted cerumen, right ear: Secondary | ICD-10-CM

## 2024-03-04 NOTE — ED Triage Notes (Signed)
 Pt presents to have ears checked for wax. PCP told her she has build up. She used debrox this morning

## 2024-03-04 NOTE — ED Provider Notes (Signed)
 [facility]    CSN: [id number] Arrival date & time: 03/04/24  1409      History   Chief Complaint Chief Complaint  Patient presents with   Cerumen Impaction    HPI Kathryn Cuevas is a 89 y.o. female.   Kathryn Cuevas is a 89 y.o. female presenting for chief complaint of Cerumen Impaction.  She states her ears feel full bilaterally.  She was told 18 months ago that she has bilateral cerumen impactions by her PCP.  PCP recommended use of over-the-counter Debrox.  Patient started using Debrox 1 to 2 days ago and has not had any relief of ear fullness. Denies fever, chills, nausea, vomiting, ear pain, rash and dizziness. No tinnitus. No drainage from the ears.      Past Medical History:  Diagnosis Date   History of hyperkalemia    Sporadic episodes leading to cramping etc.   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   Osteoarthritis of both hips    As well as lumbar spine   Thyroid  disease     Patient Active Problem List   Diagnosis Date Noted   Bilateral lower extremity edema 02/11/2024   Physical deconditioning 02/11/2024   Atrial fibrillation (HCC) 02/11/2024   Hyponatremia 02/11/2024   Unexplained night sweats 02/11/2024   Chronic kidney disease, stage 3a (HCC) 02/11/2024   Chronic respiratory failure with hypoxia (HCC) 11/29/2023   Pericardial effusion 11/26/2023   Coronary artery disease involving native coronary artery of native heart without angina pectoris 11/26/2023   S/P coronary artery stent placement 11/26/2023   NSTEMI (non-ST elevated myocardial infarction) (HCC) 10/27/2023   Chronic obstructive pulmonary disease (HCC) 10/27/2023   Hypothyroidism 10/27/2023   Prediabetes 10/27/2023   Macrocytosis 10/27/2023   ACS (acute coronary syndrome) (HCC) 10/27/2023   Hypocalcemia 10/27/2023   Tobacco abuse 10/27/2023   Amaurosis fugax of right eye 09/22/2017   Lumbar radiculopathy 09/24/2015    Past Surgical History:   Procedure Laterality Date   ANTERIOR FUSION LUMBAR SPINE     CORONARY ATHERECTOMY N/A 11/19/2023   Procedure: CORONARY ATHERECTOMY;  Surgeon: Swaziland, Peter M, MD;  Location: [facility] INVASIVE CV LAB;  Service: Cardiovascular;  Laterality: N/A;   CORONARY STENT INTERVENTION N/A 10/27/2023   Procedure: CORONARY STENT INTERVENTION;  Surgeon: Elmira Newman PARAS, MD;  Location: [facility] INVASIVE CV LAB;  Service: Cardiovascular;  Laterality: N/A;   CORONARY STENT INTERVENTION N/A 11/19/2023   Procedure: CORONARY STENT INTERVENTION;  Surgeon: Swaziland, Peter M, MD;  Location: [facility] INVASIVE CV LAB;  Service: Cardiovascular;  Laterality: N/A;   LEFT HEART CATH AND CORONARY ANGIOGRAPHY N/A 10/27/2023   Procedure: LEFT HEART CATH AND CORONARY ANGIOGRAPHY;  Surgeon: Elmira Newman PARAS, MD;  Location: [facility] INVASIVE CV LAB;  Service: Cardiovascular;  Laterality: N/A;   LEFT HEART CATH AND CORONARY ANGIOGRAPHY N/A 11/19/2023   Procedure: LEFT HEART CATH AND CORONARY ANGIOGRAPHY;  Surgeon: Swaziland, Peter M, MD;  Location: [facility] INVASIVE CV LAB;  Service: Cardiovascular;  Laterality: N/A;   PERICARDIOCENTESIS N/A 11/19/2023   Procedure: PERICARDIOCENTESIS;  Surgeon: Swaziland, Peter M, MD;  Location: [facility] INVASIVE CV LAB;  Service: Cardiovascular;  Laterality: N/A;   TOTAL ABDOMINAL HYSTERECTOMY     With BSO   TOTAL HIP ARTHROPLASTY Bilateral     OB History   No obstetric history on file.      Home Medications    Prior to Admission medications   Medication Sig Start Date End Date Taking? Authorizing Provider  amiodarone  (PACERONE ) 200 MG tablet Take 1 tablet (200 mg total) by mouth daily. 12/02/23   Zhao, Xika, NP  aspirin  EC 81 MG tablet Take 1 tablet (81 mg total) by mouth daily. Swallow whole. 02/11/24   Anner Alm ORN, MD  atorvastatin  (LIPITOR ) 80 MG tablet Take 1 tablet (80 mg total) by mouth daily. 02/11/24   Anner Alm ORN, MD  clopidogrel  (PLAVIX ) 75 MG tablet Take 1 tablet (75 mg total) by mouth daily with breakfast.  02/11/24   Anner Alm ORN, MD  furosemide  (LASIX ) 20 MG tablet Take 1 tablet (20 mg total) by mouth daily. 02/11/24   Anner Alm ORN, MD  nitroGLYCERIN  (NITROSTAT ) 0.4 MG SL tablet Place 1 tablet (0.4 mg total) under the tongue every 5 (five) minutes x 3 doses as needed for chest pain. 10/29/23   Darci Pore, MD  SYNTHROID  88 MCG tablet Take 88 mcg by mouth daily.    [provider]    Family History Family History  Problem Relation Age of Onset   Heart disease Mother        Congenital   Heart attack Father        Died at age 3 -was a long-term smoker with poor diet>     Social History Social History   Tobacco Use   Smoking status: Former    Current packs/day: 1.00    Average packs/day: 1 pack/day for 62.0 years (62.0 ttl pk-yrs)    Types: Cigarettes   Smokeless tobacco: Never  Substance Use Topics   Alcohol use: Not Currently   Drug use: Not Currently     Allergies   Levothyroxine , Augmentin [amoxicillin-pot clavulanate], and Sulfa antibiotics   Review of Systems Review of Systems Per HPI  Physical Exam Triage Vital Signs ED Triage Vitals  Encounter Vitals Group     BP 03/04/24 1439 121/72     Girls Systolic BP Percentile --      Girls Diastolic BP Percentile --      Boys Systolic BP Percentile --      Boys Diastolic BP Percentile --      Pulse Rate 03/04/24 1439 60     Resp 03/04/24 1439 17     Temp 03/04/24 1439 97.8 F (36.6 C)     Temp Source 03/04/24 1439 Oral     SpO2 03/04/24 1439 90 %     Weight --      Height --      Head Circumference --      Peak Flow --      Pain Score 03/04/24 1447 0     Pain Loc --      Pain Education --      Exclude from Growth Chart --    No data found.  Updated Vital Signs BP 121/72 (BP Location: Right Arm)   Pulse 60   Temp 97.8 F (36.6 C) (Oral)   Resp 17   SpO2 90%   Visual Acuity Right Eye Distance:   Left Eye Distance:   Bilateral Distance:    Right Eye Near:   Left Eye Near:     Bilateral Near:     Physical Exam Vitals and nursing note reviewed.  Constitutional:      Appearance: She is not ill-appearing or toxic-appearing.  HENT:     Head: Normocephalic and atraumatic.     Right Ear: Hearing and external ear normal. There is impacted cerumen.     Left Ear: Hearing, tympanic membrane, ear canal  and external ear normal.     Nose: Nose normal.     Mouth/Throat:     Lips: Pink.  Eyes:     General: Lids are normal. Vision grossly intact. Gaze aligned appropriately.     Extraocular Movements: Extraocular movements intact.     Conjunctiva/sclera: Conjunctivae normal.  Pulmonary:     Effort: Pulmonary effort is normal.  Musculoskeletal:     Cervical back: Neck supple.  Skin:    General: Skin is warm and dry.     Capillary Refill: Capillary refill takes less than 2 seconds.     Findings: No rash.  Neurological:     General: No focal deficit present.     Mental Status: She is alert and oriented to person, place, and time. Mental status is at baseline.     Cranial Nerves: No dysarthria or facial asymmetry.  Psychiatric:        Mood and Affect: Mood normal.        Speech: Speech normal.        Behavior: Behavior normal.        Thought Content: Thought content normal.        Judgment: Judgment normal.      UC Treatments / Results  Labs (all labs ordered are listed, but only abnormal results are displayed) Labs Reviewed - No data to display  EKG   Radiology No results found.  Procedures Procedures (including critical care time)  Medications Ordered in UC Medications - No data to display  Initial Impression / Assessment and Plan / UC Course  I have reviewed the triage vital signs and the nursing notes.  Pertinent labs & imaging results that were available during my care of the patient were reviewed by me and considered in my medical decision making (see chart for details).   1. Impacted cerumen of right ear Right ear(s) cleaned with ear  lavage to remove ear wax impactions bilaterally by nursing staff.  Reassessment shows normal tympanic membrane(s) without signs of AOM/AOE.  Patient may use debrox ear drops at home as needed for wax removal and has been advised to avoid using Q-tips.   Counseled patient on potential for adverse effects with medications prescribed/recommended today, strict ER and return-to-clinic precautions discussed, patient verbalized understanding.    Final Clinical Impressions(s) / UC Diagnoses   Final diagnoses:  Impacted cerumen of right ear     Discharge Instructions      We flushed out the earwax from your ear(s) today.  Do not use Q-tips as this makes symptoms worse and pushes wax further into the ear canal.  You may use over-the-counter Debrox eardrops as needed for earwax removal at home as needed.  If you develop any new or worsening symptoms or if your symptoms do not start to improve, pleases return here or follow-up with your primary care provider. If your symptoms are severe, please go to the emergency room.      ED Prescriptions   None    PDMP not reviewed this encounter.   Kathryn Cuevas, [place] 03/04/24 [phone]

## 2024-03-04 NOTE — Discharge Instructions (Signed)
We flushed out the earwax from your ear(s) today.  Do not use Q-tips as this makes symptoms worse and pushes wax further into the ear canal.  You may use over-the-counter Debrox eardrops as needed for earwax removal at home as needed.  If you develop any new or worsening symptoms or if your symptoms do not start to improve, pleases return here or follow-up with your primary care provider. If your symptoms are severe, please go to the emergency room.

## 2024-03-09 DIAGNOSIS — J449 Chronic obstructive pulmonary disease, unspecified: Secondary | ICD-10-CM | POA: Diagnosis not present

## 2024-03-09 DIAGNOSIS — I083 Combined rheumatic disorders of mitral, aortic and tricuspid valves: Secondary | ICD-10-CM | POA: Diagnosis not present

## 2024-03-09 DIAGNOSIS — I25118 Atherosclerotic heart disease of native coronary artery with other forms of angina pectoris: Secondary | ICD-10-CM | POA: Diagnosis not present

## 2024-03-09 DIAGNOSIS — I480 Paroxysmal atrial fibrillation: Secondary | ICD-10-CM | POA: Diagnosis not present

## 2024-03-09 DIAGNOSIS — T82897D Other specified complication of cardiac prosthetic devices, implants and grafts, subsequent encounter: Secondary | ICD-10-CM | POA: Diagnosis not present

## 2024-03-09 DIAGNOSIS — I119 Hypertensive heart disease without heart failure: Secondary | ICD-10-CM | POA: Diagnosis not present

## 2024-03-10 DIAGNOSIS — J449 Chronic obstructive pulmonary disease, unspecified: Secondary | ICD-10-CM | POA: Diagnosis not present

## 2024-03-10 DIAGNOSIS — T82897D Other specified complication of cardiac prosthetic devices, implants and grafts, subsequent encounter: Secondary | ICD-10-CM | POA: Diagnosis not present

## 2024-03-10 DIAGNOSIS — I119 Hypertensive heart disease without heart failure: Secondary | ICD-10-CM | POA: Diagnosis not present

## 2024-03-10 DIAGNOSIS — I25118 Atherosclerotic heart disease of native coronary artery with other forms of angina pectoris: Secondary | ICD-10-CM | POA: Diagnosis not present

## 2024-03-10 DIAGNOSIS — I083 Combined rheumatic disorders of mitral, aortic and tricuspid valves: Secondary | ICD-10-CM | POA: Diagnosis not present

## 2024-03-10 DIAGNOSIS — I480 Paroxysmal atrial fibrillation: Secondary | ICD-10-CM | POA: Diagnosis not present

## 2024-03-11 DIAGNOSIS — I119 Hypertensive heart disease without heart failure: Secondary | ICD-10-CM | POA: Diagnosis not present

## 2024-03-11 DIAGNOSIS — I25118 Atherosclerotic heart disease of native coronary artery with other forms of angina pectoris: Secondary | ICD-10-CM | POA: Diagnosis not present

## 2024-03-11 DIAGNOSIS — I083 Combined rheumatic disorders of mitral, aortic and tricuspid valves: Secondary | ICD-10-CM | POA: Diagnosis not present

## 2024-03-11 DIAGNOSIS — I480 Paroxysmal atrial fibrillation: Secondary | ICD-10-CM | POA: Diagnosis not present

## 2024-03-11 DIAGNOSIS — T82897D Other specified complication of cardiac prosthetic devices, implants and grafts, subsequent encounter: Secondary | ICD-10-CM | POA: Diagnosis not present

## 2024-03-11 DIAGNOSIS — J449 Chronic obstructive pulmonary disease, unspecified: Secondary | ICD-10-CM | POA: Diagnosis not present

## 2024-03-14 DIAGNOSIS — R7303 Prediabetes: Secondary | ICD-10-CM | POA: Diagnosis not present

## 2024-03-14 DIAGNOSIS — J449 Chronic obstructive pulmonary disease, unspecified: Secondary | ICD-10-CM | POA: Diagnosis not present

## 2024-03-14 DIAGNOSIS — I110 Hypertensive heart disease with heart failure: Secondary | ICD-10-CM | POA: Diagnosis not present

## 2024-03-14 DIAGNOSIS — I700 Atherosclerosis of aorta: Secondary | ICD-10-CM | POA: Diagnosis not present

## 2024-03-14 DIAGNOSIS — Z7902 Long term (current) use of antithrombotics/antiplatelets: Secondary | ICD-10-CM | POA: Diagnosis not present

## 2024-03-14 DIAGNOSIS — Z9981 Dependence on supplemental oxygen: Secondary | ICD-10-CM | POA: Diagnosis not present

## 2024-03-14 DIAGNOSIS — I480 Paroxysmal atrial fibrillation: Secondary | ICD-10-CM | POA: Diagnosis not present

## 2024-03-14 DIAGNOSIS — D539 Nutritional anemia, unspecified: Secondary | ICD-10-CM | POA: Diagnosis not present

## 2024-03-14 DIAGNOSIS — Z720 Tobacco use: Secondary | ICD-10-CM | POA: Diagnosis not present

## 2024-03-14 DIAGNOSIS — I3139 Other pericardial effusion (noninflammatory): Secondary | ICD-10-CM | POA: Diagnosis not present

## 2024-03-14 DIAGNOSIS — I25118 Atherosclerotic heart disease of native coronary artery with other forms of angina pectoris: Secondary | ICD-10-CM | POA: Diagnosis not present

## 2024-03-14 DIAGNOSIS — E039 Hypothyroidism, unspecified: Secondary | ICD-10-CM | POA: Diagnosis not present

## 2024-03-14 DIAGNOSIS — I252 Old myocardial infarction: Secondary | ICD-10-CM | POA: Diagnosis not present

## 2024-03-14 DIAGNOSIS — Z955 Presence of coronary angioplasty implant and graft: Secondary | ICD-10-CM | POA: Diagnosis not present

## 2024-03-14 DIAGNOSIS — I509 Heart failure, unspecified: Secondary | ICD-10-CM | POA: Diagnosis not present

## 2024-03-15 DIAGNOSIS — I509 Heart failure, unspecified: Secondary | ICD-10-CM | POA: Diagnosis not present

## 2024-03-15 DIAGNOSIS — I700 Atherosclerosis of aorta: Secondary | ICD-10-CM | POA: Diagnosis not present

## 2024-03-15 DIAGNOSIS — I110 Hypertensive heart disease with heart failure: Secondary | ICD-10-CM | POA: Diagnosis not present

## 2024-03-15 DIAGNOSIS — I25118 Atherosclerotic heart disease of native coronary artery with other forms of angina pectoris: Secondary | ICD-10-CM | POA: Diagnosis not present

## 2024-03-15 DIAGNOSIS — J449 Chronic obstructive pulmonary disease, unspecified: Secondary | ICD-10-CM | POA: Diagnosis not present

## 2024-03-15 DIAGNOSIS — I480 Paroxysmal atrial fibrillation: Secondary | ICD-10-CM | POA: Diagnosis not present

## 2024-03-18 DIAGNOSIS — I25118 Atherosclerotic heart disease of native coronary artery with other forms of angina pectoris: Secondary | ICD-10-CM | POA: Diagnosis not present

## 2024-03-18 DIAGNOSIS — I110 Hypertensive heart disease with heart failure: Secondary | ICD-10-CM | POA: Diagnosis not present

## 2024-03-18 DIAGNOSIS — I700 Atherosclerosis of aorta: Secondary | ICD-10-CM | POA: Diagnosis not present

## 2024-03-18 DIAGNOSIS — J449 Chronic obstructive pulmonary disease, unspecified: Secondary | ICD-10-CM | POA: Diagnosis not present

## 2024-03-18 DIAGNOSIS — I509 Heart failure, unspecified: Secondary | ICD-10-CM | POA: Diagnosis not present

## 2024-03-18 DIAGNOSIS — I480 Paroxysmal atrial fibrillation: Secondary | ICD-10-CM | POA: Diagnosis not present

## 2024-03-19 DIAGNOSIS — I25118 Atherosclerotic heart disease of native coronary artery with other forms of angina pectoris: Secondary | ICD-10-CM | POA: Diagnosis not present

## 2024-03-19 DIAGNOSIS — I700 Atherosclerosis of aorta: Secondary | ICD-10-CM | POA: Diagnosis not present

## 2024-03-19 DIAGNOSIS — I509 Heart failure, unspecified: Secondary | ICD-10-CM | POA: Diagnosis not present

## 2024-03-19 DIAGNOSIS — J449 Chronic obstructive pulmonary disease, unspecified: Secondary | ICD-10-CM | POA: Diagnosis not present

## 2024-03-19 DIAGNOSIS — I480 Paroxysmal atrial fibrillation: Secondary | ICD-10-CM | POA: Diagnosis not present

## 2024-03-19 DIAGNOSIS — I110 Hypertensive heart disease with heart failure: Secondary | ICD-10-CM | POA: Diagnosis not present

## 2024-03-22 DIAGNOSIS — I25118 Atherosclerotic heart disease of native coronary artery with other forms of angina pectoris: Secondary | ICD-10-CM | POA: Diagnosis not present

## 2024-03-22 DIAGNOSIS — I509 Heart failure, unspecified: Secondary | ICD-10-CM | POA: Diagnosis not present

## 2024-03-22 DIAGNOSIS — I700 Atherosclerosis of aorta: Secondary | ICD-10-CM | POA: Diagnosis not present

## 2024-03-22 DIAGNOSIS — J449 Chronic obstructive pulmonary disease, unspecified: Secondary | ICD-10-CM | POA: Diagnosis not present

## 2024-03-22 DIAGNOSIS — I110 Hypertensive heart disease with heart failure: Secondary | ICD-10-CM | POA: Diagnosis not present

## 2024-03-22 DIAGNOSIS — I480 Paroxysmal atrial fibrillation: Secondary | ICD-10-CM | POA: Diagnosis not present

## 2024-03-24 DIAGNOSIS — I700 Atherosclerosis of aorta: Secondary | ICD-10-CM | POA: Diagnosis not present

## 2024-03-24 DIAGNOSIS — I110 Hypertensive heart disease with heart failure: Secondary | ICD-10-CM | POA: Diagnosis not present

## 2024-03-24 DIAGNOSIS — I509 Heart failure, unspecified: Secondary | ICD-10-CM | POA: Diagnosis not present

## 2024-03-24 DIAGNOSIS — I480 Paroxysmal atrial fibrillation: Secondary | ICD-10-CM | POA: Diagnosis not present

## 2024-03-24 DIAGNOSIS — I25118 Atherosclerotic heart disease of native coronary artery with other forms of angina pectoris: Secondary | ICD-10-CM | POA: Diagnosis not present

## 2024-03-24 DIAGNOSIS — J449 Chronic obstructive pulmonary disease, unspecified: Secondary | ICD-10-CM | POA: Diagnosis not present

## 2024-03-25 DIAGNOSIS — E7800 Pure hypercholesterolemia, unspecified: Secondary | ICD-10-CM | POA: Diagnosis not present

## 2024-03-25 DIAGNOSIS — J449 Chronic obstructive pulmonary disease, unspecified: Secondary | ICD-10-CM | POA: Diagnosis not present

## 2024-03-25 DIAGNOSIS — E039 Hypothyroidism, unspecified: Secondary | ICD-10-CM | POA: Diagnosis not present

## 2024-03-28 DIAGNOSIS — I509 Heart failure, unspecified: Secondary | ICD-10-CM | POA: Diagnosis not present

## 2024-03-28 DIAGNOSIS — I25118 Atherosclerotic heart disease of native coronary artery with other forms of angina pectoris: Secondary | ICD-10-CM | POA: Diagnosis not present

## 2024-03-28 DIAGNOSIS — I480 Paroxysmal atrial fibrillation: Secondary | ICD-10-CM | POA: Diagnosis not present

## 2024-03-28 DIAGNOSIS — I700 Atherosclerosis of aorta: Secondary | ICD-10-CM | POA: Diagnosis not present

## 2024-03-28 DIAGNOSIS — I110 Hypertensive heart disease with heart failure: Secondary | ICD-10-CM | POA: Diagnosis not present

## 2024-03-28 DIAGNOSIS — J449 Chronic obstructive pulmonary disease, unspecified: Secondary | ICD-10-CM | POA: Diagnosis not present

## 2024-03-29 DIAGNOSIS — Z23 Encounter for immunization: Secondary | ICD-10-CM | POA: Diagnosis not present

## 2024-03-31 ENCOUNTER — Inpatient Hospital Stay (HOSPITAL_COMMUNITY)
Admission: EM | Admit: 2024-03-31 | Discharge: 2024-04-03 | DRG: 193 | Disposition: A | Discharge status: Home Health | Attending: Internal Medicine | Admitting: Internal Medicine

## 2024-03-31 ENCOUNTER — Emergency Department (HOSPITAL_COMMUNITY)

## 2024-03-31 DIAGNOSIS — E785 Hyperlipidemia, unspecified: Secondary | ICD-10-CM | POA: Diagnosis present

## 2024-03-31 DIAGNOSIS — J189 Pneumonia, unspecified organism: Secondary | ICD-10-CM | POA: Diagnosis not present

## 2024-03-31 DIAGNOSIS — Z9079 Acquired absence of other genital organ(s): Secondary | ICD-10-CM

## 2024-03-31 DIAGNOSIS — Z9981 Dependence on supplemental oxygen: Secondary | ICD-10-CM | POA: Diagnosis not present

## 2024-03-31 DIAGNOSIS — Z8249 Family history of ischemic heart disease and other diseases of the circulatory system: Secondary | ICD-10-CM

## 2024-03-31 DIAGNOSIS — N1831 Chronic kidney disease, stage 3a: Secondary | ICD-10-CM | POA: Diagnosis present

## 2024-03-31 DIAGNOSIS — Z90710 Acquired absence of both cervix and uterus: Secondary | ICD-10-CM

## 2024-03-31 DIAGNOSIS — Z882 Allergy status to sulfonamides status: Secondary | ICD-10-CM | POA: Diagnosis not present

## 2024-03-31 DIAGNOSIS — Z7902 Long term (current) use of antithrombotics/antiplatelets: Secondary | ICD-10-CM | POA: Diagnosis not present

## 2024-03-31 DIAGNOSIS — J9601 Acute respiratory failure with hypoxia: Secondary | ICD-10-CM | POA: Diagnosis not present

## 2024-03-31 DIAGNOSIS — J9621 Acute and chronic respiratory failure with hypoxia: Secondary | ICD-10-CM | POA: Diagnosis present

## 2024-03-31 DIAGNOSIS — J9611 Chronic respiratory failure with hypoxia: Secondary | ICD-10-CM | POA: Diagnosis present

## 2024-03-31 DIAGNOSIS — I480 Paroxysmal atrial fibrillation: Secondary | ICD-10-CM | POA: Diagnosis present

## 2024-03-31 DIAGNOSIS — Z79899 Other long term (current) drug therapy: Secondary | ICD-10-CM

## 2024-03-31 DIAGNOSIS — Z87891 Personal history of nicotine dependence: Secondary | ICD-10-CM

## 2024-03-31 DIAGNOSIS — J440 Chronic obstructive pulmonary disease with acute lower respiratory infection: Secondary | ICD-10-CM | POA: Diagnosis present

## 2024-03-31 DIAGNOSIS — Z79890 Hormone replacement therapy (postmenopausal): Secondary | ICD-10-CM | POA: Diagnosis not present

## 2024-03-31 DIAGNOSIS — M160 Bilateral primary osteoarthritis of hip: Secondary | ICD-10-CM | POA: Diagnosis present

## 2024-03-31 DIAGNOSIS — Z96643 Presence of artificial hip joint, bilateral: Secondary | ICD-10-CM | POA: Diagnosis present

## 2024-03-31 DIAGNOSIS — D72829 Elevated white blood cell count, unspecified: Secondary | ICD-10-CM | POA: Diagnosis not present

## 2024-03-31 DIAGNOSIS — J441 Chronic obstructive pulmonary disease with (acute) exacerbation: Secondary | ICD-10-CM | POA: Diagnosis present

## 2024-03-31 DIAGNOSIS — Z955 Presence of coronary angioplasty implant and graft: Secondary | ICD-10-CM

## 2024-03-31 DIAGNOSIS — T380X5A Adverse effect of glucocorticoids and synthetic analogues, initial encounter: Secondary | ICD-10-CM | POA: Diagnosis not present

## 2024-03-31 DIAGNOSIS — J432 Centrilobular emphysema: Secondary | ICD-10-CM | POA: Diagnosis not present

## 2024-03-31 DIAGNOSIS — I509 Heart failure, unspecified: Secondary | ICD-10-CM | POA: Diagnosis not present

## 2024-03-31 DIAGNOSIS — J180 Bronchopneumonia, unspecified organism: Principal | ICD-10-CM | POA: Diagnosis present

## 2024-03-31 DIAGNOSIS — R918 Other nonspecific abnormal finding of lung field: Secondary | ICD-10-CM | POA: Diagnosis not present

## 2024-03-31 DIAGNOSIS — I25118 Atherosclerotic heart disease of native coronary artery with other forms of angina pectoris: Secondary | ICD-10-CM | POA: Diagnosis not present

## 2024-03-31 DIAGNOSIS — Z7982 Long term (current) use of aspirin: Secondary | ICD-10-CM

## 2024-03-31 DIAGNOSIS — E039 Hypothyroidism, unspecified: Secondary | ICD-10-CM | POA: Diagnosis present

## 2024-03-31 DIAGNOSIS — Z90722 Acquired absence of ovaries, bilateral: Secondary | ICD-10-CM

## 2024-03-31 DIAGNOSIS — Z1152 Encounter for screening for COVID-19: Secondary | ICD-10-CM | POA: Diagnosis not present

## 2024-03-31 DIAGNOSIS — R0602 Shortness of breath: Secondary | ICD-10-CM | POA: Diagnosis not present

## 2024-03-31 DIAGNOSIS — Z981 Arthrodesis status: Secondary | ICD-10-CM

## 2024-03-31 DIAGNOSIS — I110 Hypertensive heart disease with heart failure: Secondary | ICD-10-CM | POA: Diagnosis not present

## 2024-03-31 DIAGNOSIS — I2510 Atherosclerotic heart disease of native coronary artery without angina pectoris: Secondary | ICD-10-CM | POA: Diagnosis present

## 2024-03-31 DIAGNOSIS — R9389 Abnormal findings on diagnostic imaging of other specified body structures: Secondary | ICD-10-CM | POA: Diagnosis not present

## 2024-03-31 DIAGNOSIS — Z881 Allergy status to other antibiotic agents status: Secondary | ICD-10-CM | POA: Diagnosis not present

## 2024-03-31 DIAGNOSIS — I700 Atherosclerosis of aorta: Secondary | ICD-10-CM | POA: Diagnosis present

## 2024-03-31 DIAGNOSIS — J449 Chronic obstructive pulmonary disease, unspecified: Secondary | ICD-10-CM | POA: Diagnosis not present

## 2024-03-31 LAB — CBC WITH DIFFERENTIAL/PLATELET
Abs Immature Granulocytes: 0.03 K/uL (ref 0.00–0.07)
Basophils Absolute: 0.1 K/uL (ref 0.0–0.1)
Basophils Relative: 1 %
Eosinophils Absolute: 0.5 K/uL (ref 0.0–0.5)
Eosinophils Relative: 7 %
HCT: 41.1 % (ref 36.0–46.0)
Hemoglobin: 13.4 g/dL (ref 12.0–15.0)
Immature Granulocytes: 0 %
Lymphocytes Relative: 22 %
Lymphs Abs: 1.9 K/uL (ref 0.7–4.0)
MCH: 32.3 pg (ref 26.0–34.0)
MCHC: 32.6 g/dL (ref 30.0–36.0)
MCV: 99 fL (ref 80.0–100.0)
Monocytes Absolute: 1 K/uL (ref 0.1–1.0)
Monocytes Relative: 12 %
Neutro Abs: 4.9 K/uL (ref 1.7–7.7)
Neutrophils Relative %: 58 %
Platelets: 330 K/uL (ref 150–400)
RBC: 4.15 MIL/uL (ref 3.87–5.11)
RDW: 15.2 % (ref 11.5–15.5)
WBC: 8.4 K/uL (ref 4.0–10.5)
nRBC: 0 % (ref 0.0–0.2)

## 2024-03-31 LAB — COMPREHENSIVE METABOLIC PANEL WITH GFR
ALT: 22 U/L (ref 0–44)
AST: 30 U/L (ref 15–41)
Albumin: 3.9 g/dL (ref 3.5–5.0)
Alkaline Phosphatase: 152 U/L — ABNORMAL HIGH (ref 38–126)
Anion gap: 10 (ref 5–15)
BUN: 21 mg/dL (ref 8–23)
CO2: 26 mmol/L (ref 22–32)
Calcium: 9 mg/dL (ref 8.9–10.3)
Chloride: 96 mmol/L — ABNORMAL LOW (ref 98–111)
Creatinine, Ser: 0.95 mg/dL (ref 0.44–1.00)
GFR, Estimated: 57 mL/min — ABNORMAL LOW (ref >60)
Glucose, Bld: 108 mg/dL — ABNORMAL HIGH (ref 70–99)
Potassium: 4 mmol/L (ref 3.5–5.1)
Sodium: 132 mmol/L — ABNORMAL LOW (ref 135–145)
Total Bilirubin: 0.4 mg/dL (ref 0.0–1.2)
Total Protein: 7.2 g/dL (ref 6.5–8.1)

## 2024-03-31 LAB — TROPONIN T, HIGH SENSITIVITY: Troponin T High Sensitivity: <15 ng/L (ref 0–19)

## 2024-03-31 LAB — RESP PANEL BY RT-PCR (RSV, FLU A&B, COVID)  RVPGX2
Influenza A by PCR: NEGATIVE
Influenza B by PCR: NEGATIVE
Resp Syncytial Virus by PCR: NEGATIVE
SARS Coronavirus 2 by RT PCR: NEGATIVE

## 2024-03-31 LAB — PRO BRAIN NATRIURETIC PEPTIDE: Pro Brain Natriuretic Peptide: 226 pg/mL (ref <300.0)

## 2024-03-31 MED ORDER — CEFTRIAXONE SODIUM 1 G IJ SOLR
1.0000 g | Freq: Once | INTRAMUSCULAR | Status: AC
  Administered 2024-03-31: 1 g via INTRAVENOUS
  Filled 2024-03-31: qty 10

## 2024-03-31 MED ORDER — SODIUM CHLORIDE 0.9 % IV SOLN
500.0000 mg | Freq: Once | INTRAVENOUS | Status: AC
  Administered 2024-03-31: 500 mg via INTRAVENOUS
  Filled 2024-03-31: qty 5

## 2024-03-31 MED ORDER — METHYLPREDNISOLONE SODIUM SUCC 125 MG IJ SOLR
125.0000 mg | Freq: Once | INTRAMUSCULAR | Status: AC
  Administered 2024-03-31: 125 mg via INTRAVENOUS
  Filled 2024-03-31: qty 2

## 2024-03-31 MED ORDER — IPRATROPIUM-ALBUTEROL 0.5-2.5 (3) MG/3ML IN SOLN
3.0000 mL | Freq: Once | RESPIRATORY_TRACT | Status: AC
  Administered 2024-03-31: 3 mL via RESPIRATORY_TRACT
  Filled 2024-03-31: qty 3

## 2024-03-31 MED ORDER — IOHEXOL 350 MG/ML SOLN
75.0000 mL | Freq: Once | INTRAVENOUS | Status: AC | PRN
  Administered 2024-03-31: 75 mL via INTRAVENOUS

## 2024-03-31 NOTE — ED Provider Notes (Addendum)
 [place] EMERGENCY DEPARTMENT AT [facility] Provider Note   CSN: [id number] Arrival date & time: 03/31/24  1647     Patient presents with: Shortness of Breath   Kathryn Cuevas is a 89 y.o. female.   Patient here with cough congestion history of COPD wears 1-1/4 oxygen she is having to increase this.  She does have CAD history of heart failure history takes Lasix .  She denies any fevers or chills but she has noticed a change in her cough and congestion and production.  This has been for the last several days.  She denies any leg swelling.  No abdominal pain no nausea vomiting diarrhea.  The history is provided by the patient.       Prior to Admission medications   Medication Sig Start Date End Date Taking? Authorizing Provider  amiodarone  (PACERONE ) 200 MG tablet Take 1 tablet (200 mg total) by mouth daily. 12/02/23   Zhao, Xika, NP  aspirin  EC 81 MG tablet Take 1 tablet (81 mg total) by mouth daily. Swallow whole. 02/11/24   Anner Alm ORN, MD  atorvastatin  (LIPITOR ) 80 MG tablet Take 1 tablet (80 mg total) by mouth daily. 02/11/24   Anner Alm ORN, MD  clopidogrel  (PLAVIX ) 75 MG tablet Take 1 tablet (75 mg total) by mouth daily with breakfast. 02/11/24   Anner Alm ORN, MD  furosemide  (LASIX ) 20 MG tablet Take 1 tablet (20 mg total) by mouth daily. 02/11/24   Anner Alm ORN, MD  nitroGLYCERIN  (NITROSTAT ) 0.4 MG SL tablet Place 1 tablet (0.4 mg total) under the tongue every 5 (five) minutes x 3 doses as needed for chest pain. 10/29/23   Darci Pore, MD  SYNTHROID  88 MCG tablet Take 88 mcg by mouth daily.    [provider]    Allergies: Levothyroxine , Augmentin [amoxicillin-pot clavulanate], and Sulfa antibiotics    Review of Systems  Updated Vital Signs BP (!) 121/56   Pulse 73   Temp 98 F (36.7 C) (Oral)   Resp 19   SpO2 94%   Physical Exam Vitals and nursing note reviewed.  Constitutional:      General: She is not in acute distress.     Appearance: She is well-developed. She is not ill-appearing.  HENT:     Head: Normocephalic and atraumatic.     Mouth/Throat:     Mouth: Mucous membranes are moist.  Eyes:     Extraocular Movements: Extraocular movements intact.     Conjunctiva/sclera: Conjunctivae normal.     Pupils: Pupils are equal, round, and reactive to light.  Cardiovascular:     Rate and Rhythm: Normal rate and regular rhythm.     Pulses: Normal pulses.     Heart sounds: Normal heart sounds. No murmur heard. Pulmonary:     Effort: Pulmonary effort is normal. No respiratory distress.     Breath sounds: Decreased breath sounds and wheezing present.  Abdominal:     Palpations: Abdomen is soft.     Tenderness: There is no abdominal tenderness.  Musculoskeletal:        General: No swelling.     Cervical back: Normal range of motion and neck supple.     Right lower leg: No edema.     Left lower leg: No edema.  Skin:    General: Skin is warm and dry.     Capillary Refill: Capillary refill takes less than 2 seconds.  Neurological:     Mental Status: She is alert.  Psychiatric:  Mood and Affect: Mood normal.     (all labs ordered are listed, but only abnormal results are displayed) Labs Reviewed  COMPREHENSIVE METABOLIC PANEL WITH GFR - Abnormal; Notable for the following components:      Result Value   Sodium 132 (*)    Chloride 96 (*)    Glucose, Bld 108 (*)    Alkaline Phosphatase 152 (*)    GFR, Estimated 57 (*)    All other components within normal limits  RESP PANEL BY RT-PCR (RSV, FLU A&B, COVID)  [id number]  CBC WITH DIFFERENTIAL/PLATELET  PRO BRAIN NATRIURETIC PEPTIDE  TROPONIN T, HIGH SENSITIVITY    EKG: EKG Interpretation Date/Time:  Thursday March 31 2024 17:10:44 EST Ventricular Rate:  67 PR Interval:  72 QRS Duration:  86 QT Interval:  583 QTC Calculation: 616 R Axis:   73  Text Interpretation: Sinus rhythm Short PR interval Confirmed by Ruthe Cornet [phone]) on 03/31/2024  6:01:32 PM  Radiology: CT Angio Chest PE W and/or Wo Contrast Result Date: 03/31/2024 CLINICAL DATA:  Short of breath, chest congestion, cough EXAM: CT ANGIOGRAPHY CHEST WITH CONTRAST TECHNIQUE: Multidetector CT imaging of the chest was performed using the standard protocol during bolus administration of intravenous contrast. Multiplanar CT image reconstructions and MIPs were obtained to evaluate the vascular anatomy. RADIATION DOSE REDUCTION: This exam was performed according to the departmental dose-optimization program which includes automated exposure control, adjustment of the mA and/or kV according to patient size and/or use of iterative reconstruction technique. CONTRAST:  75mL OMNIPAQUE  IOHEXOL  350 MG/ML SOLN COMPARISON:  03/31/2024 FINDINGS: Cardiovascular: This is a technically adequate evaluation of the pulmonary vasculature. No filling defects or pulmonary emboli. Mild cardiomegaly without pericardial effusion. No evidence of thoracic aortic aneurysm or dissection. Atherosclerosis of the aorta and coronary vasculature. Mediastinum/Nodes: No enlarged mediastinal, hilar, or axillary lymph nodes. Thyroid  gland, trachea, and esophagus demonstrate no significant findings. Lungs/Pleura: Upper lobe predominant centrilobular emphysema. There is bilateral bronchial wall thickening, with retained secretions throughout the tracheobronchial tree greatest in the lower lobes. Areas of atelectasis at the lung bases, left greater than right. Scattered ground-glass airspace disease within the upper lobes. No effusion or pneumothorax. Upper Abdomen: No acute abnormality. Musculoskeletal: No acute or destructive bony abnormalities. Reconstructed images demonstrate no additional findings. Review of the MIP images confirms the above findings. IMPRESSION: 1. No evidence of pulmonary embolus. 2. Bilateral bronchial wall thickening, with retained secretions throughout the tracheobronchial tree and scattered ground-glass  opacities as above, consistent with bronchitis and scattered bronchopneumonia. 3. Aortic Atherosclerosis (ICD10-I70.0). Coronary artery atherosclerosis. Electronically Signed   By: Ozell Daring M.D.   On: 03/31/2024 20:46   DG Chest Portable 1 View Result Date: 03/31/2024 CLINICAL DATA:  Shortness of breath EXAM: PORTABLE CHEST 1 VIEW COMPARISON:  11/23/2023, 03/20/2013 FINDINGS: Mild elevation left diaphragm. Suspicion of small left pleural effusion and mild left basilar airspace disease. Stable cardiomediastinal silhouette with aortic atherosclerosis. Streaky scarring at the right base. IMPRESSION: Suspicion of small left pleural effusion and mild left basilar airspace disease, atelectasis versus pneumonia. Electronically Signed   By: Luke Bun M.D.   On: 03/31/2024 17:53     .Critical Care  Performed by: Ruthe Cornet, DO Authorized by: Ruthe Cornet, DO   Critical care provider statement:    Critical care time (minutes):  40   Critical care was necessary to treat or prevent imminent or life-threatening deterioration of the following conditions:  Respiratory failure   Critical care was time spent personally by me on  the following activities:  Development of treatment plan with patient or surrogate, blood draw for specimens, discussions with consultants, evaluation of patient's response to treatment, examination of patient, obtaining history from patient or surrogate, ordering and performing treatments and interventions, ordering and review of laboratory studies, ordering and review of radiographic studies, pulse oximetry and re-evaluation of patient's condition   Care discussed with: admitting provider      Medications Ordered in the ED  azithromycin (ZITHROMAX) 500 mg in sodium chloride  0.9 % 250 mL IVPB (has no administration in time range)  ipratropium-albuterol (DUONEB) 0.5-2.5 (3) MG/3ML nebulizer solution 3 mL (3 mLs Nebulization Given 03/31/24 1821)  methylPREDNISolone sodium  succinate (SOLU-MEDROL) 125 mg/2 mL injection 125 mg (125 mg Intravenous Given 03/31/24 1824)  iohexol  (OMNIPAQUE ) 350 MG/ML injection 75 mL (75 mLs Intravenous Contrast Given 03/31/24 2022)  ipratropium-albuterol (DUONEB) 0.5-2.5 (3) MG/3ML nebulizer solution 3 mL (3 mLs Nebulization Given 03/31/24 2143)  cefTRIAXone (ROCEPHIN) 1 g in sodium chloride  0.9 % 100 mL IVPB (1 g Intravenous New Bag/Given 03/31/24 2139)                                    Medical Decision Making Amount and/or Complexity of Data Reviewed Labs: ordered. Radiology: ordered.  Risk Prescription drug management. Decision regarding hospitalization.   JORIE ZEE is here with shortness of breath cough sputum production.  Normal vitals.  No fever.  Increased oxygen need from her baseline to 4 L here.  She has got wheezing on exam diminished breath sounds.  No signs of major volume overload on exam.  Not having any chest pain.  EKG shows sinus rhythm with no obvious ischemic changes.  She does have history of CAD COPD on 1 to 2 L of oxygen at baseline.  Overall differential could be volume overload versus COPD exacerbation versus pneumonia versus less likely ACS PE.  Will get CBC CMP BNP troponin chest x-ray will give breathing treatment steroids and reevaluate.  Will get COVID and flu test as well.  Overall per my review interpretation the labs there is no significant leukocytosis anemia or electrolyte abnormality.  I have no concern for ACS or volume overload.  Chest x-ray was somewhat equivocal so I pursued a CT scan to further evaluate.  She had no evidence of pulmonary embolism.  But did show bilateral bronchial wall thickening with scattered bronchopneumonia.  Patient at first did not want to be admitted to the hospital.  I had her ambulate on 4 L of oxygen and her oxygen level continued to decrease in the low 80s.  She was having slow recovery having the increased oxygen to 6 L to maintain sats above 90.  Ultimately I would  think that she needs to stay for breathing treatments steroids and antibiotics and further monitoring.  Eventually I talked with the daughter who was able to convince her to stay for further treatment.  She is requiring for more oxygen than her baseline she had increased work of breathing I think she would benefit from further care.  Will admit to the hospitalist.  This chart was dictated using voice recognition software.  Despite best efforts to proofread,  errors can occur which can change the documentation meaning.      Final diagnoses:  Acute respiratory failure with hypoxia (HCC)  Bronchopneumonia    ED Discharge Orders     None  Ruthe Cornet, DO 03/31/24 2220    Ruthe Cornet, DO 03/31/24 2253

## 2024-03-31 NOTE — ED Triage Notes (Signed)
 C/o SOB along with congestion in her chest with cough starting Monday. Worse at night when lying down. Hx copd Oxygen baseline 1.25 [place].  Pt takes furosimide at home.

## 2024-04-01 ENCOUNTER — Other Ambulatory Visit: Payer: Self-pay

## 2024-04-01 ENCOUNTER — Encounter (HOSPITAL_COMMUNITY): Payer: Self-pay | Admitting: Family Medicine

## 2024-04-01 DIAGNOSIS — J9611 Chronic respiratory failure with hypoxia: Secondary | ICD-10-CM

## 2024-04-01 DIAGNOSIS — J9621 Acute and chronic respiratory failure with hypoxia: Secondary | ICD-10-CM | POA: Diagnosis not present

## 2024-04-01 DIAGNOSIS — J441 Chronic obstructive pulmonary disease with (acute) exacerbation: Secondary | ICD-10-CM | POA: Diagnosis present

## 2024-04-01 DIAGNOSIS — N1831 Chronic kidney disease, stage 3a: Secondary | ICD-10-CM

## 2024-04-01 DIAGNOSIS — E039 Hypothyroidism, unspecified: Secondary | ICD-10-CM

## 2024-04-01 DIAGNOSIS — J180 Bronchopneumonia, unspecified organism: Secondary | ICD-10-CM | POA: Diagnosis present

## 2024-04-01 DIAGNOSIS — I480 Paroxysmal atrial fibrillation: Secondary | ICD-10-CM

## 2024-04-01 LAB — CBC
HCT: 35.2 % — ABNORMAL LOW (ref 36.0–46.0)
Hemoglobin: 11.2 g/dL — ABNORMAL LOW (ref 12.0–15.0)
MCH: 32.2 pg (ref 26.0–34.0)
MCHC: 31.8 g/dL (ref 30.0–36.0)
MCV: 101.1 fL — ABNORMAL HIGH (ref 80.0–100.0)
Platelets: 278 K/uL (ref 150–400)
RBC: 3.48 MIL/uL — ABNORMAL LOW (ref 3.87–5.11)
RDW: 15.2 % (ref 11.5–15.5)
WBC: 6 K/uL (ref 4.0–10.5)
nRBC: 0 % (ref 0.0–0.2)

## 2024-04-01 LAB — BASIC METABOLIC PANEL WITH GFR
Anion gap: 10 (ref 5–15)
BUN: 25 mg/dL — ABNORMAL HIGH (ref 8–23)
CO2: 26 mmol/L (ref 22–32)
Calcium: 8.5 mg/dL — ABNORMAL LOW (ref 8.9–10.3)
Chloride: 99 mmol/L (ref 98–111)
Creatinine, Ser: 1.12 mg/dL — ABNORMAL HIGH (ref 0.44–1.00)
GFR, Estimated: 46 mL/min — ABNORMAL LOW (ref >60)
Glucose, Bld: 178 mg/dL — ABNORMAL HIGH (ref 70–99)
Potassium: 4.2 mmol/L (ref 3.5–5.1)
Sodium: 135 mmol/L (ref 135–145)

## 2024-04-01 MED ORDER — GUAIFENESIN ER 600 MG PO TB12
600.0000 mg | ORAL_TABLET | Freq: Two times a day (BID) | ORAL | Status: DC
  Administered 2024-04-01 – 2024-04-03 (×6): 600 mg via ORAL
  Filled 2024-04-01 (×6): qty 1

## 2024-04-01 MED ORDER — ATORVASTATIN CALCIUM 40 MG PO TABS
80.0000 mg | ORAL_TABLET | Freq: Every evening | ORAL | Status: DC
  Administered 2024-04-01 – 2024-04-02 (×2): 80 mg via ORAL
  Filled 2024-04-01 (×2): qty 2

## 2024-04-01 MED ORDER — FUROSEMIDE 20 MG PO TABS
20.0000 mg | ORAL_TABLET | Freq: Every day | ORAL | Status: DC
  Filled 2024-04-01: qty 1

## 2024-04-01 MED ORDER — ACETAMINOPHEN 325 MG PO TABS: 650.0000 mg | ORAL_TABLET | Freq: Four times a day (QID) | ORAL | Status: DC | PRN

## 2024-04-01 MED ORDER — IPRATROPIUM-ALBUTEROL 0.5-2.5 (3) MG/3ML IN SOLN
3.0000 mL | Freq: Three times a day (TID) | RESPIRATORY_TRACT | Status: DC
  Administered 2024-04-01 – 2024-04-03 (×7): 3 mL via RESPIRATORY_TRACT
  Filled 2024-04-01 (×7): qty 3

## 2024-04-01 MED ORDER — IPRATROPIUM-ALBUTEROL 0.5-2.5 (3) MG/3ML IN SOLN
3.0000 mL | RESPIRATORY_TRACT | Status: DC | PRN
  Administered 2024-04-01: 3 mL via RESPIRATORY_TRACT
  Filled 2024-04-01: qty 3

## 2024-04-01 MED ORDER — METHYLPREDNISOLONE SODIUM SUCC 125 MG IJ SOLR
125.0000 mg | Freq: Two times a day (BID) | INTRAMUSCULAR | Status: AC
  Administered 2024-04-01 (×2): 125 mg via INTRAVENOUS
  Filled 2024-04-01 (×2): qty 2

## 2024-04-01 MED ORDER — ASPIRIN 81 MG PO TBEC
81.0000 mg | DELAYED_RELEASE_TABLET | Freq: Every day | ORAL | Status: DC
  Administered 2024-04-01 – 2024-04-03 (×3): 81 mg via ORAL
  Filled 2024-04-01 (×3): qty 1

## 2024-04-01 MED ORDER — SENNOSIDES-DOCUSATE SODIUM 8.6-50 MG PO TABS: 1.0000 | ORAL_TABLET | Freq: Every evening | ORAL | Status: DC | PRN

## 2024-04-01 MED ORDER — SODIUM CHLORIDE 0.9% FLUSH
3.0000 mL | Freq: Two times a day (BID) | INTRAVENOUS | Status: DC
  Administered 2024-04-01 – 2024-04-02 (×4): 3 mL via INTRAVENOUS

## 2024-04-01 MED ORDER — AZITHROMYCIN 500 MG PO TABS
500.0000 mg | ORAL_TABLET | Freq: Every day | ORAL | Status: DC
  Administered 2024-04-01 – 2024-04-02 (×2): 500 mg via ORAL
  Filled 2024-04-01 (×2): qty 2

## 2024-04-01 MED ORDER — SODIUM CHLORIDE 0.9 % IV SOLN
2.0000 g | INTRAVENOUS | Status: DC
  Administered 2024-04-01 – 2024-04-02 (×2): 2 g via INTRAVENOUS
  Filled 2024-04-01 (×2): qty 20

## 2024-04-01 MED ORDER — PREDNISONE 20 MG PO TABS
40.0000 mg | ORAL_TABLET | Freq: Every day | ORAL | Status: DC
  Administered 2024-04-02 – 2024-04-03 (×2): 40 mg via ORAL
  Filled 2024-04-01 (×2): qty 2

## 2024-04-01 MED ORDER — ENOXAPARIN SODIUM 30 MG/0.3ML IJ SOSY
30.0000 mg | PREFILLED_SYRINGE | INTRAMUSCULAR | Status: DC
  Administered 2024-04-01 – 2024-04-02 (×2): 30 mg via SUBCUTANEOUS
  Filled 2024-04-01 (×2): qty 0.3

## 2024-04-01 MED ORDER — PROCHLORPERAZINE EDISYLATE 10 MG/2ML IJ SOLN: 5.0000 mg | Freq: Four times a day (QID) | INTRAMUSCULAR | Status: DC | PRN

## 2024-04-01 MED ORDER — LEVOTHYROXINE SODIUM 88 MCG PO TABS
88.0000 ug | ORAL_TABLET | Freq: Every day | ORAL | Status: DC
  Administered 2024-04-01 – 2024-04-03 (×3): 88 ug via ORAL
  Filled 2024-04-01 (×3): qty 1

## 2024-04-01 MED ORDER — CLOPIDOGREL BISULFATE 75 MG PO TABS
75.0000 mg | ORAL_TABLET | Freq: Every day | ORAL | Status: DC
  Administered 2024-04-01 – 2024-04-03 (×3): 75 mg via ORAL
  Filled 2024-04-01 (×3): qty 1

## 2024-04-01 MED ORDER — ACETAMINOPHEN 650 MG RE SUPP: 650.0000 mg | Freq: Four times a day (QID) | RECTAL | Status: DC | PRN

## 2024-04-01 NOTE — Subjective & Objective (Addendum)
 Pt seen and examined. O2 weaned to 2 L/min. Pt feels ready to go home. Son will come by the hospital around 9:30 AM to pick her up.  I advised her due to the upcoming cold weather predicted on Monday, that she stay indoors after she gets home from her dental appointment.  Patient is medically stable for discharge.

## 2024-04-01 NOTE — Assessment & Plan Note (Addendum)
 04/01/24 on had brief run of afib during hospitalization June 2025. Cardiology felt that she did not warrant systemic anticoagulation due to only brief episode of afib. Had been on amiodarone  until 01-2024 when she was seen by Dr. Anner with cardiology and weaned off amiodarone . She is now off amiodarone . Per Dr. Genice office notes from 02-09-2024. Plan is to After six weeks off amiodarone , wear a heart monitor for two weeks to check for atrial fibrillation. - Follow up after the monitoring period to assess for any recurrence of atrial fibrillation.:  Pt will need to f/u with cardiology as outpatient.  04/02/24 stable.  Follow-up with outpatient cardiology.  04/03/24 f/u with outpatient cards.

## 2024-04-01 NOTE — Assessment & Plan Note (Addendum)
 04/01/24 stable. On ASA, lipitor .  04/02/24 stable.  Continue aspirin  81 mg a day, Lipitor  80 mg daily.  04/03/24 stable.

## 2024-04-01 NOTE — Assessment & Plan Note (Addendum)
 04/01/24 continue synthroid  88 mcg daily.  04/02/24 stable.  Continue Synthroid  88 mcg daily.  04/03/24 stable.

## 2024-04-01 NOTE — H&P (Signed)
 History and Physical    Kathryn Cuevas FMW:[id number] DOB: 12-Jul-1931 DOA: 03/31/2024  PCP: Okey Carlin Redbird, MD   Patient coming from: Home   Chief Complaint: increased cough and SOB   HPI: Kathryn Cuevas is a 89 y.o. female with medical history significant for COPD, chronic hypoxic respiratory failure, hypothyroidism, CAD, CKD 3, and atrial fibrillation not anticoagulated who presents with increased cough, increased sputum production, and worsening shortness of breath.  Patient has had a general sense of malaise and poor appetite for a few days, her neighbor thought she appeared short of breath 2 days ago, and the patient reports increased cough with increased sputum production and worsening shortness of breath since yesterday.  She denies fevers, chest pain, or abdominal pain.  ED Course: Upon arrival to the ED, patient is found to be afebrile and saturating mid 80s on 2 L/min of supplemental oxygen with normal HR and stable BP.  Labs are most notable for sodium 132, normal WBC, and normal proBNP.  CTA chest is negative for PE but concerning for bronchitis and bronchopneumonia.  Patient was treated in the ED with Rocephin, azithromycin, IV Solu-Medrol, and DuoNeb x 2.  Review of Systems:  All other systems reviewed and apart from HPI, are negative.  Past Medical History:  Diagnosis Date   Chronic obstructive pulmonary disease (HCC) 10/27/2023   History of hyperkalemia    Sporadic episodes leading to cramping etc.   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   Osteoarthritis of both hips    As well as lumbar spine   Thyroid  disease     Past Surgical History:  Procedure Laterality Date   ANTERIOR FUSION LUMBAR SPINE     CORONARY ATHERECTOMY N/A 11/19/2023   Procedure: CORONARY ATHERECTOMY;  Surgeon: Jordan, Peter M, MD;  Location: [facility] INVASIVE CV LAB;  Service: Cardiovascular;  Laterality: N/A;   CORONARY STENT INTERVENTION N/A 10/27/2023   Procedure: CORONARY  STENT INTERVENTION;  Surgeon: Elmira Newman PARAS, MD;  Location: [facility] INVASIVE CV LAB;  Service: Cardiovascular;  Laterality: N/A;   CORONARY STENT INTERVENTION N/A 11/19/2023   Procedure: CORONARY STENT INTERVENTION;  Surgeon: Jordan, Peter M, MD;  Location: [facility] INVASIVE CV LAB;  Service: Cardiovascular;  Laterality: N/A;   LEFT HEART CATH AND CORONARY ANGIOGRAPHY N/A 10/27/2023   Procedure: LEFT HEART CATH AND CORONARY ANGIOGRAPHY;  Surgeon: Elmira Newman PARAS, MD;  Location: [facility] INVASIVE CV LAB;  Service: Cardiovascular;  Laterality: N/A;   LEFT HEART CATH AND CORONARY ANGIOGRAPHY N/A 11/19/2023   Procedure: LEFT HEART CATH AND CORONARY ANGIOGRAPHY;  Surgeon: Jordan, Peter M, MD;  Location: [facility] INVASIVE CV LAB;  Service: Cardiovascular;  Laterality: N/A;   PERICARDIOCENTESIS N/A 11/19/2023   Procedure: PERICARDIOCENTESIS;  Surgeon: Jordan, Peter M, MD;  Location: [facility] INVASIVE CV LAB;  Service: Cardiovascular;  Laterality: N/A;   TOTAL ABDOMINAL HYSTERECTOMY     With BSO   TOTAL HIP ARTHROPLASTY Bilateral     Social History:   reports that she has quit smoking. Her smoking use included cigarettes. She has a 62 pack-year smoking history. She has never used smokeless tobacco. She reports that she does not currently use alcohol. She reports that she does not currently use drugs.  Allergies  Allergen Reactions   Levothyroxine  Other (See Comments)    Shoulder/neck pain. Can tolerate Synthroid .    Augmentin [Amoxicillin-Pot Clavulanate] Rash   Sulfa Antibiotics Rash    Family History  Problem Relation Age of Onset  Heart disease Mother        Congenital   Heart attack Father        Died at age 31 -was a long-term smoker with poor diet>      Prior to Admission medications   Medication Sig Start Date End Date Taking? Authorizing Provider  aspirin  EC 81 MG tablet Take 1 tablet (81 mg total) by mouth daily. Swallow whole. 02/11/24  Yes Anner Alm ORN, MD  atorvastatin  (LIPITOR ) 80 MG tablet  Take 1 tablet (80 mg total) by mouth daily. 02/11/24  Yes Anner Alm ORN, MD  clopidogrel  (PLAVIX ) 75 MG tablet Take 1 tablet (75 mg total) by mouth daily with breakfast. 02/11/24  Yes Anner Alm ORN, MD  fluticasone (FLONASE) 50 MCG/ACT nasal spray Place 2 sprays into both nostrils daily as needed. 01/12/24  Yes [provider]  furosemide  (LASIX ) 20 MG tablet Take 1 tablet (20 mg total) by mouth daily. 02/11/24  Yes Anner Alm ORN, MD  guaiFENesin (MUCINEX) 600 MG 12 hr tablet Take 600 mg by mouth 2 (two) times daily.   Yes [provider]  loratadine (CLARITIN) 10 MG tablet Take 10 mg by mouth daily.   Yes [provider]  nitroGLYCERIN  (NITROSTAT ) 0.4 MG SL tablet Place 1 tablet (0.4 mg total) under the tongue every 5 (five) minutes x 3 doses as needed for chest pain. 10/29/23  Yes Darci Pore, MD  SYNTHROID  88 MCG tablet Take 88 mcg by mouth daily.   Yes [provider]    Physical Exam: Vitals:   03/31/24 1908 03/31/24 2221 04/01/24 0004 04/01/24 0100  BP: (!) 121/56 124/62 (!) 108/50 (!) 97/57  Pulse: 73 81 80 74  Resp: 19 19 14 16   Temp: 98 F (36.7 C) 97.7 F (36.5 C)    TempSrc: Oral Oral    SpO2: 94% 93% 95% 98%    Constitutional: NAD, no pallor or diaphoresis  Eyes: PERTLA, lids and conjunctivae normal ENMT: Mucous membranes are moist. Posterior pharynx clear of any exudate or lesions.   Neck: supple, no masses  Respiratory: Speaking full sentences. Prolonged expiratory phase. Coarse rales.   Cardiovascular: S1 & S2 heard, regular rate and rhythm. No JVD. Abdomen: No tenderness, soft. Bowel sounds active.  Musculoskeletal: no clubbing / cyanosis. No joint deformity upper and lower extremities.   Skin: no significant rashes, lesions, ulcers. Warm, dry, well-perfused. Neurologic: CN 2-12 grossly intact. Moving all extremities. Alert and oriented.  Psychiatric: Calm. Cooperative.    Labs and Imaging on Admission: I have  personally reviewed following labs and imaging studies  CBC: Recent Labs  Lab 03/31/24 1804  WBC 8.4  NEUTROABS 4.9  HGB 13.4  HCT 41.1  MCV 99.0  PLT 330   Basic Metabolic Panel: Recent Labs  Lab 03/31/24 1804  NA 132*  K 4.0  CL 96*  CO2 26  GLUCOSE 108*  BUN 21  CREATININE 0.95  CALCIUM  9.0   GFR: CrCl cannot be calculated (Unknown ideal weight.). Liver Function Tests: Recent Labs  Lab 03/31/24 1804  AST 30  ALT 22  ALKPHOS 152*  BILITOT 0.4  PROT 7.2  ALBUMIN 3.9   No results for input(s): LIPASE, AMYLASE in the last 168 hours. No results for input(s): AMMONIA in the last 168 hours. Coagulation Profile: No results for input(s): INR, PROTIME in the last 168 hours. Cardiac Enzymes: No results for input(s): CKTOTAL, CKMB, CKMBINDEX, TROPONINI in the last 168 hours. BNP (last 3 results) Recent Labs  03/31/24 1935  PROBNP 226.0   HbA1C: No results for input(s): HGBA1C in the last 72 hours. CBG: No results for input(s): GLUCAP in the last 168 hours. Lipid Profile: No results for input(s): CHOL, HDL, LDLCALC, TRIG, CHOLHDL, LDLDIRECT in the last 72 hours. Thyroid  Function Tests: No results for input(s): TSH, T4TOTAL, FREET4, T3FREE, THYROIDAB in the last 72 hours. Anemia Panel: No results for input(s): VITAMINB12, FOLATE, FERRITIN, TIBC, IRON, RETICCTPCT in the last 72 hours. Urine analysis:    Component Value Date/Time   COLORURINE YELLOW 09/25/2019 0738   APPEARANCEUR CLEAR 09/25/2019 0738   LABSPEC 1.005 09/25/2019 0738   PHURINE 6.0 09/25/2019 0738   GLUCOSEU NEGATIVE 09/25/2019 0738   HGBUR SMALL (A) 09/25/2019 0738   BILIRUBINUR NEGATIVE 09/25/2019 0738   KETONESUR NEGATIVE 09/25/2019 0738   PROTEINUR NEGATIVE 09/25/2019 0738   UROBILINOGEN 0.2 03/20/2013 1405   NITRITE NEGATIVE 09/25/2019 0738   LEUKOCYTESUR MODERATE (A) 09/25/2019 0738   Sepsis  Labs: @LABRCNTIP (procalcitonin:4,lacticidven:4) ) Recent Results (from the past 240 hours)  Resp panel by RT-PCR (RSV, Flu A&B, Covid) Anterior Nasal Swab     Status: None   Collection Time: 03/31/24  6:36 PM   Specimen: Anterior Nasal Swab  Result Value Ref Range Status   SARS Coronavirus 2 by RT PCR NEGATIVE NEGATIVE Final    Comment: (NOTE) SARS-CoV-2 target nucleic acids are NOT DETECTED.  The SARS-CoV-2 RNA is generally detectable in upper respiratory specimens during the acute phase of infection. The lowest concentration of SARS-CoV-2 viral copies this assay can detect is 138 copies/mL. A negative result does not preclude SARS-Cov-2 infection and should not be used as the sole basis for treatment or other patient management decisions. A negative result may occur with  improper specimen collection/handling, submission of specimen other than nasopharyngeal swab, presence of viral mutation(s) within the areas targeted by this assay, and inadequate number of viral copies(<138 copies/mL). A negative result must be combined with clinical observations, patient history, and epidemiological information. The expected result is Negative.  Fact Sheet for Patients:  bloggercourse.com  Fact Sheet for Healthcare Providers:  seriousbroker.it  This test is no t yet approved or cleared by the [place]  FDA and  has been authorized for detection and/or diagnosis of SARS-CoV-2 by FDA under an Emergency Use Authorization (EUA). This EUA will remain  in effect (meaning this test can be used) for the duration of the COVID-19 declaration under Section 564(b)(1) of the Act, 21 U.S.C.section 360bbb-3(b)(1), unless the authorization is terminated  or revoked sooner.       Influenza A by PCR NEGATIVE NEGATIVE Final   Influenza B by PCR NEGATIVE NEGATIVE Final    Comment: (NOTE) The Xpert Xpress SARS-CoV-2/FLU/RSV plus assay is intended as an  aid in the diagnosis of influenza from Nasopharyngeal swab specimens and should not be used as a sole basis for treatment. Nasal washings and aspirates are unacceptable for Xpert Xpress SARS-CoV-2/FLU/RSV testing.  Fact Sheet for Patients: bloggercourse.com  Fact Sheet for Healthcare Providers: seriousbroker.it  This test is not yet approved or cleared by the [place]  FDA and has been authorized for detection and/or diagnosis of SARS-CoV-2 by FDA under an Emergency Use Authorization (EUA). This EUA will remain in effect (meaning this test can be used) for the duration of the COVID-19 declaration under Section 564(b)(1) of the Act, 21 U.S.C. section 360bbb-3(b)(1), unless the authorization is terminated or revoked.     Resp Syncytial Virus by PCR NEGATIVE NEGATIVE Final    Comment: (  NOTE) Fact Sheet for Patients: bloggercourse.com  Fact Sheet for Healthcare Providers: seriousbroker.it  This test is not yet approved or cleared by the [place]  FDA and has been authorized for detection and/or diagnosis of SARS-CoV-2 by FDA under an Emergency Use Authorization (EUA). This EUA will remain in effect (meaning this test can be used) for the duration of the COVID-19 declaration under Section 564(b)(1) of the Act, 21 U.S.C. section 360bbb-3(b)(1), unless the authorization is terminated or revoked.  Performed at [facility], [address], [place], [place] [address]      Radiological Exams on Admission: CT Angio Chest PE W and/or Wo Contrast Result Date: 03/31/2024 CLINICAL DATA:  Short of breath, chest congestion, cough EXAM: CT ANGIOGRAPHY CHEST WITH CONTRAST TECHNIQUE: Multidetector CT imaging of the chest was performed using the standard protocol during bolus administration of intravenous contrast. Multiplanar CT image reconstructions and MIPs were  obtained to evaluate the vascular anatomy. RADIATION DOSE REDUCTION: This exam was performed according to the departmental dose-optimization program which includes automated exposure control, adjustment of the mA and/or kV according to patient size and/or use of iterative reconstruction technique. CONTRAST:  75mL OMNIPAQUE  IOHEXOL  350 MG/ML SOLN COMPARISON:  03/31/2024 FINDINGS: Cardiovascular: This is a technically adequate evaluation of the pulmonary vasculature. No filling defects or pulmonary emboli. Mild cardiomegaly without pericardial effusion. No evidence of thoracic aortic aneurysm or dissection. Atherosclerosis of the aorta and coronary vasculature. Mediastinum/Nodes: No enlarged mediastinal, hilar, or axillary lymph nodes. Thyroid  gland, trachea, and esophagus demonstrate no significant findings. Lungs/Pleura: Upper lobe predominant centrilobular emphysema. There is bilateral bronchial wall thickening, with retained secretions throughout the tracheobronchial tree greatest in the lower lobes. Areas of atelectasis at the lung bases, left greater than right. Scattered ground-glass airspace disease within the upper lobes. No effusion or pneumothorax. Upper Abdomen: No acute abnormality. Musculoskeletal: No acute or destructive bony abnormalities. Reconstructed images demonstrate no additional findings. Review of the MIP images confirms the above findings. IMPRESSION: 1. No evidence of pulmonary embolus. 2. Bilateral bronchial wall thickening, with retained secretions throughout the tracheobronchial tree and scattered ground-glass opacities as above, consistent with bronchitis and scattered bronchopneumonia. 3. Aortic Atherosclerosis (ICD10-I70.0). Coronary artery atherosclerosis. Electronically Signed   By: Ozell Daring M.D.   On: 03/31/2024 20:46   DG Chest Portable 1 View Result Date: 03/31/2024 CLINICAL DATA:  Shortness of breath EXAM: PORTABLE CHEST 1 VIEW COMPARISON:  11/23/2023, 03/20/2013  FINDINGS: Mild elevation left diaphragm. Suspicion of small left pleural effusion and mild left basilar airspace disease. Stable cardiomediastinal silhouette with aortic atherosclerosis. Streaky scarring at the right base. IMPRESSION: Suspicion of small left pleural effusion and mild left basilar airspace disease, atelectasis versus pneumonia. Electronically Signed   By: Luke Bun M.D.   On: 03/31/2024 17:53    EKG: Independently reviewed. Sinus rhythm.   Assessment/Plan   1. COPD exacerbation; bronchopneumonia; acute on chronic hypoxic respiratory failure  - Culture sputum, continue systemic steroids, antibiotics, short-acting bronchodilators, and supplemental O2     2. CAD  - No anginal symptoms  - Continue ASA, Plavix , and Lipitor    3. Hypothyroidism  - Continue Synthroid      DVT prophylaxis: Lovenox   Code Status: Full  Level of Care: Level of care: Telemetry Family Communication: Neighbor, [name], at bedside  Disposition Plan:  Patient is from: home  Anticipated d/c is to: TBD Anticipated d/c date is: 04/04/24  Patient currently: Pending improved respiratory status  Consults called: None  Admission status: Inpatient  Evalene GORMAN Sprinkles, MD [facility]  04/01/2024, 1:03 AM

## 2024-04-01 NOTE — Progress Notes (Signed)
 PROGRESS NOTE    Kathryn Cuevas  FMW:[id number] DOB: [birth year] DOA: 03/31/2024 PCP: Okey Carlin Redbird, MD  Subjective: Pt seen and examined. Chart reviewed. Admitted for COPD exacerbation and pneumonia. SOB for several days prior to admission.  Patient states that she is normally on 1 to 1-1/2 L of oxygen at home.  She states that she wants to go home as soon as possible.  She has a dentist appointment on Monday that she cannot miss.  She lives at home alone with a caregiver.   Hospital Course: CC: SOB, chest congestion for 4 days. On home O2 HPI: Kathryn Cuevas is a 89 y.o. female with medical history significant for COPD, chronic hypoxic respiratory failure, hypothyroidism, CAD, CKD 3, and atrial fibrillation not anticoagulated who presents with increased cough, increased sputum production, and worsening shortness of breath.   Patient has had a general sense of malaise and poor appetite for a few days, her neighbor thought she appeared short of breath 2 days ago, and the patient reports increased cough with increased sputum production and worsening shortness of breath since yesterday.  She denies fevers, chest pain, or abdominal pain.   ED Course: Upon arrival to the ED, patient is found to be afebrile and saturating mid 80s on 2 L/min of supplemental oxygen with normal HR and stable BP.  Labs are most notable for sodium 132, normal WBC, and normal proBNP.  CTA chest is negative for PE but concerning for bronchitis and bronchopneumonia.   Patient was treated in the ED with Rocephin, azithromycin, IV Solu-Medrol, and DuoNeb x 2.  Significant Events: Admitted 03/31/2024 for bronchopneumonia, COPD exacerbation   Admission Labs: WBC 8.4, HgB 13.4, plt 330 Na 132, K 4.0, CO2 of 26, BUN 21, Scr 0.95, glu 108 T. Prot 7.2, alb 3.9, AST 30, ALT 22, alk phos 152, t. Bili 0.4 Covid/rsv/flu negative Pro BNP 226   Admission Imaging Studies: CXR Suspicion of small left pleural effusion and mild  left basilar airspace disease, atelectasis versus pneumonia CTPA No evidence of pulmonary embolus. 2. Bilateral bronchial wall thickening, with retained secretions throughout the tracheobronchial tree and scattered ground-glass opacities as above, consistent with bronchitis and scattered bronchopneumonia. 3. Aortic Atherosclerosis (ICD10-I70.0). Coronary artery atherosclerosis.  Significant Labs:   Significant Imaging Studies:   Antibiotic Therapy: Anti-infectives (From admission, onward)    Start     Dose/Rate Route Frequency Ordered Stop   04/01/24 2200  azithromycin (ZITHROMAX) tablet 500 mg        500 mg Oral Daily at bedtime 04/01/24 0102 04/05/24 2159   04/01/24 2100  cefTRIAXone (ROCEPHIN) 2 g in sodium chloride  0.9 % 100 mL IVPB        2 g 200 mL/hr over 30 Minutes Intravenous Every 24 hours 04/01/24 0102 04/05/24 2059   03/31/24 2100  cefTRIAXone (ROCEPHIN) 1 g in sodium chloride  0.9 % 100 mL IVPB        1 g 200 mL/hr over 30 Minutes Intravenous  Once 03/31/24 2059 03/31/24 2257   03/31/24 2100  azithromycin (ZITHROMAX) 500 mg in sodium chloride  0.9 % 250 mL IVPB        500 mg 250 mL/hr over 60 Minutes Intravenous  Once 03/31/24 2059 03/31/24 2349       Procedures:   Consultants:     Assessment and Plan: * COPD with acute exacerbation (HCC) 04/01/24 On IV solumedrol with orders to change to po prednisone starting tomorrow. Continue with increased supplemental O2. Normally on 1-2 liters.  Currently on 5-6 liters. She has left basilar pneumonia on my review of her CT chest. Although she does not have a leukocytosis, I think a few days of IV abx are warranted. Continue with IV rocephin. On po zithromax.   Bronchopneumonia 04/01/24 She has left basilar pneumonia on my review of her CT chest. Although she does not have a leukocytosis, I think a few days of IV abx are warranted. Continue with IV rocephin. On po zithromax.   Acute and chronic respiratory failure with  hypoxia (HCC) 04/01/24 due to COPD exacerbation and pneumonia. Continue with increased supplemental O2. Normally on 1-2 liters. Currently on 5-6 liters.    Chronic kidney disease, stage 3a (HCC) 04/01/24 stable.   PAF (paroxysmal atrial fibrillation) (HCC) 04/01/24 on had brief run of afib during hospitalization June 2025. Cardiology felt that she did not warrant systemic anticoagulation due to only brief episode of afib. Had been on amiodarone  until 01-2024 when she was seen by Dr. Anner with cardiology and weaned off amiodarone . She is now off amiodarone . Per Dr. Genice office notes from 02-09-2024. Plan is to After six weeks off amiodarone , wear a heart monitor for two weeks to check for atrial fibrillation. - Follow up after the monitoring period to assess for any recurrence of atrial fibrillation.:  Pt will need to f/u with cardiology as outpatient.  Chronic respiratory failure with hypoxia (HCC) 04/01/24 acutely worsened due to COPD exacerbation and pneumonia.  Continue with increased supplemental O2. Normally on 1-2 liters. Currently on 5-6 liters.    Coronary artery disease involving native coronary artery of native heart without angina pectoris 04/01/24 stable. On ASA, lipitor .   Acquired hypothyroidism 04/01/24 continue synthroid  88 mcg daily.    DVT prophylaxis: enoxaparin  (LOVENOX ) injection 30 mg Start: 04/01/24 1000    Code Status: Full Code Family Communication: no family at bedside. She is decisional. Disposition Plan: home Reason for continuing need for hospitalization: remains on IV rocephin and IV solumedrol.  Objective: Vitals:   04/01/24 1012 04/01/24 1247 04/01/24 1329 04/01/24 1404  BP: (!) 108/40 134/63    Pulse: 82 76    Resp: 12 20  17   Temp: (!) 97.5 F (36.4 C) 98.1 F (36.7 C)    TempSrc: Oral Oral    SpO2: 96% 98%  92%  Weight:   52.2 kg   Height:   5' 3 (1.6 m)    No intake or output data in the 24 hours ending 04/01/24 1405 Filed  Weights   04/01/24 1329  Weight: 52.2 kg    Examination:  Physical Exam Vitals and nursing note reviewed.  Constitutional:      General: She is not in acute distress.    Appearance: She is normal weight. She is not toxic-appearing.     Comments: Elderly. Chronically ill appearing  HENT:     Head: Normocephalic and atraumatic.  Eyes:     General: No scleral icterus. Cardiovascular:     Rate and Rhythm: Normal rate and regular rhythm.  Pulmonary:     Effort: No respiratory distress.     Breath sounds: No rales.  Abdominal:     General: Abdomen is flat. Bowel sounds are normal.     Palpations: Abdomen is soft.  Musculoskeletal:     Right lower leg: No edema.     Left lower leg: No edema.  Skin:    General: Skin is warm and dry.     Capillary Refill: Capillary refill takes less than 2 seconds.  Neurological:     Mental Status: She is alert and oriented to person, place, and time.     Data Reviewed: I have personally reviewed following labs and imaging studies  CBC: Recent Labs  Lab 03/31/24 1804 04/01/24 0536  WBC 8.4 6.0  NEUTROABS 4.9  --   HGB 13.4 11.2*  HCT 41.1 35.2*  MCV 99.0 101.1*  PLT 330 278   Basic Metabolic Panel: Recent Labs  Lab 03/31/24 1804 04/01/24 0536  NA 132* 135  K 4.0 4.2  CL 96* 99  CO2 26 26  GLUCOSE 108* 178*  BUN 21 25*  CREATININE 0.95 1.12*  CALCIUM  9.0 8.5*   GFR: Estimated Creatinine Clearance: 27.5 mL/min (A) (by C-G formula based on SCr of 1.12 mg/dL (H)). Liver Function Tests: Recent Labs  Lab 03/31/24 1804  AST 30  ALT 22  ALKPHOS 152*  BILITOT 0.4  PROT 7.2  ALBUMIN 3.9    Recent Results (from the past 240 hours)  Resp panel by RT-PCR (RSV, Flu A&B, Covid) Anterior Nasal Swab     Status: None   Collection Time: 03/31/24  6:36 PM   Specimen: Anterior Nasal Swab  Result Value Ref Range Status   SARS Coronavirus 2 by RT PCR NEGATIVE NEGATIVE Final    Comment: (NOTE) SARS-CoV-2 target nucleic acids are  NOT DETECTED.  The SARS-CoV-2 RNA is generally detectable in upper respiratory specimens during the acute phase of infection. The lowest concentration of SARS-CoV-2 viral copies this assay can detect is 138 copies/mL. A negative result does not preclude SARS-Cov-2 infection and should not be used as the sole basis for treatment or other patient management decisions. A negative result may occur with  improper specimen collection/handling, submission of specimen other than nasopharyngeal swab, presence of viral mutation(s) within the areas targeted by this assay, and inadequate number of viral copies(<138 copies/mL). A negative result must be combined with clinical observations, patient history, and epidemiological information. The expected result is Negative.  Fact Sheet for Patients:  bloggercourse.com  Fact Sheet for Healthcare Providers:  seriousbroker.it  This test is no t yet approved or cleared by the [place]  FDA and  has been authorized for detection and/or diagnosis of SARS-CoV-2 by FDA under an Emergency Use Authorization (EUA). This EUA will remain  in effect (meaning this test can be used) for the duration of the COVID-19 declaration under Section 564(b)(1) of the Act, 21 U.S.C.section 360bbb-3(b)(1), unless the authorization is terminated  or revoked sooner.       Influenza A by PCR NEGATIVE NEGATIVE Final   Influenza B by PCR NEGATIVE NEGATIVE Final    Comment: (NOTE) The Xpert Xpress SARS-CoV-2/FLU/RSV plus assay is intended as an aid in the diagnosis of influenza from Nasopharyngeal swab specimens and should not be used as a sole basis for treatment. Nasal washings and aspirates are unacceptable for Xpert Xpress SARS-CoV-2/FLU/RSV testing.  Fact Sheet for Patients: bloggercourse.com  Fact Sheet for Healthcare Providers: seriousbroker.it  This test is not  yet approved or cleared by the [place]  FDA and has been authorized for detection and/or diagnosis of SARS-CoV-2 by FDA under an Emergency Use Authorization (EUA). This EUA will remain in effect (meaning this test can be used) for the duration of the COVID-19 declaration under Section 564(b)(1) of the Act, 21 U.S.C. section 360bbb-3(b)(1), unless the authorization is terminated or revoked.     Resp Syncytial Virus by PCR NEGATIVE NEGATIVE Final    Comment: (NOTE) Fact Sheet for Patients: bloggercourse.com  Fact Sheet for Healthcare Providers: seriousbroker.it  This test is not yet approved or cleared by the [place]  FDA and has been authorized for detection and/or diagnosis of SARS-CoV-2 by FDA under an Emergency Use Authorization (EUA). This EUA will remain in effect (meaning this test can be used) for the duration of the COVID-19 declaration under Section 564(b)(1) of the Act, 21 U.S.C. section 360bbb-3(b)(1), unless the authorization is terminated or revoked.  Performed at [facility], [address], [place], [place] [address]      Radiology Studies: CT Angio Chest PE W and/or Wo Contrast Result Date: 03/31/2024 CLINICAL DATA:  Short of breath, chest congestion, cough EXAM: CT ANGIOGRAPHY CHEST WITH CONTRAST TECHNIQUE: Multidetector CT imaging of the chest was performed using the standard protocol during bolus administration of intravenous contrast. Multiplanar CT image reconstructions and MIPs were obtained to evaluate the vascular anatomy. RADIATION DOSE REDUCTION: This exam was performed according to the departmental dose-optimization program which includes automated exposure control, adjustment of the mA and/or kV according to patient size and/or use of iterative reconstruction technique. CONTRAST:  75mL OMNIPAQUE  IOHEXOL  350 MG/ML SOLN COMPARISON:  03/31/2024 FINDINGS: Cardiovascular: This is a  technically adequate evaluation of the pulmonary vasculature. No filling defects or pulmonary emboli. Mild cardiomegaly without pericardial effusion. No evidence of thoracic aortic aneurysm or dissection. Atherosclerosis of the aorta and coronary vasculature. Mediastinum/Nodes: No enlarged mediastinal, hilar, or axillary lymph nodes. Thyroid  gland, trachea, and esophagus demonstrate no significant findings. Lungs/Pleura: Upper lobe predominant centrilobular emphysema. There is bilateral bronchial wall thickening, with retained secretions throughout the tracheobronchial tree greatest in the lower lobes. Areas of atelectasis at the lung bases, left greater than right. Scattered ground-glass airspace disease within the upper lobes. No effusion or pneumothorax. Upper Abdomen: No acute abnormality. Musculoskeletal: No acute or destructive bony abnormalities. Reconstructed images demonstrate no additional findings. Review of the MIP images confirms the above findings. IMPRESSION: 1. No evidence of pulmonary embolus. 2. Bilateral bronchial wall thickening, with retained secretions throughout the tracheobronchial tree and scattered ground-glass opacities as above, consistent with bronchitis and scattered bronchopneumonia. 3. Aortic Atherosclerosis (ICD10-I70.0). Coronary artery atherosclerosis. Electronically Signed   By: Ozell Daring M.D.   On: 03/31/2024 20:46   DG Chest Portable 1 View Result Date: 03/31/2024 CLINICAL DATA:  Shortness of breath EXAM: PORTABLE CHEST 1 VIEW COMPARISON:  11/23/2023, 03/20/2013 FINDINGS: Mild elevation left diaphragm. Suspicion of small left pleural effusion and mild left basilar airspace disease. Stable cardiomediastinal silhouette with aortic atherosclerosis. Streaky scarring at the right base. IMPRESSION: Suspicion of small left pleural effusion and mild left basilar airspace disease, atelectasis versus pneumonia. Electronically Signed   By: Luke Bun M.D.   On: 03/31/2024 17:53     Scheduled Meds:  aspirin  EC  81 mg Oral Daily   atorvastatin   80 mg Oral QPM   azithromycin  500 mg Oral QHS   clopidogrel   75 mg Oral Q breakfast   enoxaparin  (LOVENOX ) injection  30 mg Subcutaneous Q24H   furosemide   20 mg Oral Daily   guaiFENesin  600 mg Oral BID   ipratropium-albuterol  3 mL Nebulization TID   levothyroxine   88 mcg Oral Q0600   methylPREDNISolone (SOLU-MEDROL) injection  125 mg Intravenous Q12H   Followed by   NOREEN ON 04/02/2024] predniSONE  40 mg Oral Q breakfast   sodium chloride  flush  3 mL Intravenous Q12H   Continuous Infusions:  cefTRIAXone (ROCEPHIN)  IV       LOS: 1 day  Time spent: 60 minutes  Camellia Door, DO  [facility]  04/01/2024, 2:05 PM

## 2024-04-01 NOTE — Hospital Course (Addendum)
 CC: SOB, chest congestion for 4 days. On home O2 HPI: Kathryn Cuevas is a 89 y.o. female with medical history significant for COPD, chronic hypoxic respiratory failure, hypothyroidism, CAD, CKD 3, and atrial fibrillation not anticoagulated who presents with increased cough, increased sputum production, and worsening shortness of breath.   Patient has had a general sense of malaise and poor appetite for a few days, her neighbor thought she appeared short of breath 2 days ago, and the patient reports increased cough with increased sputum production and worsening shortness of breath since yesterday.  She denies fevers, chest pain, or abdominal pain.   ED Course: Upon arrival to the ED, patient is found to be afebrile and saturating mid 80s on 2 L/min of supplemental oxygen with normal HR and stable BP.  Labs are most notable for sodium 132, normal WBC, and normal proBNP.  CTA chest is negative for PE but concerning for bronchitis and bronchopneumonia.   Patient was treated in the ED with Rocephin, azithromycin, IV Solu-Medrol, and DuoNeb x 2.  Significant Events: Admitted 03/31/2024 for bronchopneumonia, COPD exacerbation   Admission Labs: WBC 8.4, HgB 13.4, plt 330 Na 132, K 4.0, CO2 of 26, BUN 21, Scr 0.95, glu 108 T. Prot 7.2, alb 3.9, AST 30, ALT 22, alk phos 152, t. Bili 0.4 Covid/rsv/flu negative Pro BNP 226   Admission Imaging Studies: CXR Suspicion of small left pleural effusion and mild left basilar airspace disease, atelectasis versus pneumonia CTPA No evidence of pulmonary embolus. 2. Bilateral bronchial wall thickening, with retained secretions throughout the tracheobronchial tree and scattered ground-glass opacities as above, consistent with bronchitis and scattered bronchopneumonia. 3. Aortic Atherosclerosis (ICD10-I70.0). Coronary artery atherosclerosis.  Significant Labs:   Significant Imaging Studies:   Antibiotic Therapy: Anti-infectives (From admission, onward)    Start      Dose/Rate Route Frequency Ordered Stop   04/01/24 2200  azithromycin (ZITHROMAX) tablet 500 mg        500 mg Oral Daily at bedtime 04/01/24 0102 04/05/24 2159   04/01/24 2100  cefTRIAXone (ROCEPHIN) 2 g in sodium chloride  0.9 % 100 mL IVPB        2 g 200 mL/hr over 30 Minutes Intravenous Every 24 hours 04/01/24 0102 04/05/24 2059   03/31/24 2100  cefTRIAXone (ROCEPHIN) 1 g in sodium chloride  0.9 % 100 mL IVPB        1 g 200 mL/hr over 30 Minutes Intravenous  Once 03/31/24 2059 03/31/24 2257   03/31/24 2100  azithromycin (ZITHROMAX) 500 mg in sodium chloride  0.9 % 250 mL IVPB        500 mg 250 mL/hr over 60 Minutes Intravenous  Once 03/31/24 2059 03/31/24 2349       Procedures:   Consultants:

## 2024-04-01 NOTE — Assessment & Plan Note (Addendum)
 04/01/24 stable.  04/02/24 stable.  Creatinine 0.99.  04/03/24 stable.

## 2024-04-01 NOTE — ED Notes (Signed)
 BP low, MD notified. No new orders at this time.

## 2024-04-01 NOTE — Assessment & Plan Note (Addendum)
 04/01/24 She has left basilar pneumonia on my review of her CT chest. Although she does not have a leukocytosis, I think a few days of IV abx are warranted. Continue with IV rocephin. On po zithromax.  04/02/24 Continue IV Rocephin.  Plan to transition to p.o. Duricef.  Continue p.o. Zithromax.  04/03/24 change to po duricef and DC to home with duricef and zithromax.

## 2024-04-01 NOTE — Assessment & Plan Note (Addendum)
 04/01/24 acutely worsened due to COPD exacerbation and pneumonia.  Continue with increased supplemental O2. Normally on 1-2 liters. Currently on 5-6 liters.   04/02/24 chronically on 1 to 2 L of minute.  Currently exacerbated due to COPD.  04/03/24 down to 2 L/min.

## 2024-04-01 NOTE — Progress Notes (Signed)
 RT consult note:  ADM: COPD EXACERBATION HOME REGIMEN: O2 @1 .5 LPM [place], IS CURRENT THERAPY: DUONEB TID, DUONEB Q2 PRN, O2 AT 4 LPM [place] PLAN: DUONEB  TID, DUONEB Q2 PRN, O2 4 LPM [place] [facility] [place] ASSESSMENT: NO WOB, SOME SOB, SATS 92% ON 4 LPM [place], NPC, VITALS NORMAL X-RAY: 03/31/24 POSS PNA? PT ON DUONEB TID AT THIS TIME NO CHANGE IN THERAPY DUE TO PT CONDITION AND X-RAY.

## 2024-04-01 NOTE — Plan of Care (Incomplete)
  Problem: Education: Goal: Knowledge of disease or condition will improve Outcome: Progressing Goal: Knowledge of the prescribed therapeutic regimen will improve Outcome: Progressing   Problem: Activity: Goal: Ability to tolerate increased activity will improve Outcome: Progressing Goal: Will verbalize the importance of balancing activity with adequate rest periods Outcome: Progressing   Problem: Respiratory: Goal: Ability to maintain a clear airway will improve Outcome: Progressing Goal: Levels of oxygenation will improve Outcome: Progressing Goal: Ability to maintain adequate ventilation will improve Outcome: Progressing   Problem: Activity: Goal: Ability to tolerate increased activity will improve Outcome: Progressing   Problem: Clinical Measurements: Goal: Ability to maintain a body temperature in the normal range will improve Outcome: Progressing   Problem: Respiratory: Goal: Ability to maintain adequate ventilation will improve Outcome: Progressing Goal: Ability to maintain a clear airway will improve Outcome: Progressing   Problem: Education: Goal: Knowledge of General Education information will improve Description: Including pain rating scale, medication(s)/side effects and non-pharmacologic comfort measures Outcome: Progressing   Problem: Clinical Measurements: Goal: Diagnostic test results will improve Outcome: Progressing Goal: Respiratory complications will improve Outcome: Progressing

## 2024-04-01 NOTE — Assessment & Plan Note (Addendum)
 04/01/24 On IV solumedrol with orders to change to po prednisone starting tomorrow. Continue with increased supplemental O2. Normally on 1-2 liters. Currently on 5-6 liters. She has left basilar pneumonia on my review of her CT chest. Although she does not have a leukocytosis, I think a few days of IV abx are warranted. Continue with IV rocephin. On po zithromax.  04/02/24 breathing is improved.  No longer wheezing.  Now on p.o. prednisone.  Will discharge her tomorrow on p.o. Zithromax, Duricef, prednisone, DuoNebs.  Plan for discharge in the morning before 10 AM.  04/03/24 DC to home. Home nebulizer machine ordered for her. DC home with prednisone, nebs, steroids. F/u with PCP. Home health PT/OT ordered. Leukocytosis due to steroids not worsening infection. She is stable for DC to home.

## 2024-04-01 NOTE — Assessment & Plan Note (Addendum)
 04/01/24 due to COPD exacerbation and pneumonia. Continue with increased supplemental O2. Normally on 1-2 liters. Currently on 5-6 liters.   04/02/24 still currently on 3 L a minute.  Normally on 1 L minute.  Continue to wean supplemental oxygen to 1 L minute.  04/03/24 weaned down to 2 L/min. Pt already has home O2.

## 2024-04-02 DIAGNOSIS — J441 Chronic obstructive pulmonary disease with (acute) exacerbation: Secondary | ICD-10-CM | POA: Diagnosis not present

## 2024-04-02 DIAGNOSIS — J180 Bronchopneumonia, unspecified organism: Secondary | ICD-10-CM | POA: Diagnosis not present

## 2024-04-02 DIAGNOSIS — E039 Hypothyroidism, unspecified: Secondary | ICD-10-CM | POA: Diagnosis not present

## 2024-04-02 DIAGNOSIS — J9621 Acute and chronic respiratory failure with hypoxia: Secondary | ICD-10-CM | POA: Diagnosis not present

## 2024-04-02 LAB — CBC
HCT: 30.9 % — ABNORMAL LOW (ref 36.0–46.0)
Hemoglobin: 10 g/dL — ABNORMAL LOW (ref 12.0–15.0)
MCH: 32.5 pg (ref 26.0–34.0)
MCHC: 32.4 g/dL (ref 30.0–36.0)
MCV: 100.3 fL — ABNORMAL HIGH (ref 80.0–100.0)
Platelets: 299 K/uL (ref 150–400)
RBC: 3.08 MIL/uL — ABNORMAL LOW (ref 3.87–5.11)
RDW: 15.1 % (ref 11.5–15.5)
WBC: 20 K/uL — ABNORMAL HIGH (ref 4.0–10.5)
nRBC: 0 % (ref 0.0–0.2)

## 2024-04-02 LAB — BASIC METABOLIC PANEL WITH GFR
Anion gap: 8 (ref 5–15)
BUN: 33 mg/dL — ABNORMAL HIGH (ref 8–23)
CO2: 25 mmol/L (ref 22–32)
Calcium: 8.4 mg/dL — ABNORMAL LOW (ref 8.9–10.3)
Chloride: 99 mmol/L (ref 98–111)
Creatinine, Ser: 0.99 mg/dL (ref 0.44–1.00)
GFR, Estimated: 54 mL/min — ABNORMAL LOW (ref >60)
Glucose, Bld: 162 mg/dL — ABNORMAL HIGH (ref 70–99)
Potassium: 4.2 mmol/L (ref 3.5–5.1)
Sodium: 132 mmol/L — ABNORMAL LOW (ref 135–145)

## 2024-04-02 MED ORDER — AZITHROMYCIN 500 MG PO TABS: 500.0000 mg | ORAL_TABLET | Freq: Every day | ORAL | 0 refills | Status: AC

## 2024-04-02 MED ORDER — IPRATROPIUM-ALBUTEROL 0.5-2.5 (3) MG/3ML IN SOLN: RESPIRATORY_TRACT | 0 refills | Status: AC

## 2024-04-02 MED ORDER — PREDNISONE 20 MG PO TABS: 40.0000 mg | ORAL_TABLET | Freq: Every day | ORAL | 0 refills | Status: AC

## 2024-04-02 MED ORDER — CEFADROXIL 500 MG PO CAPS: 500.0000 mg | ORAL_CAPSULE | Freq: Two times a day (BID) | ORAL | 0 refills | Status: AC

## 2024-04-02 MED ORDER — ENOXAPARIN SODIUM 40 MG/0.4ML IJ SOSY: 40.0000 mg | PREFILLED_SYRINGE | INTRAMUSCULAR | Status: DC

## 2024-04-02 NOTE — Progress Notes (Signed)
 PROGRESS NOTE    Kathryn Cuevas  FMW:[id number] DOB: February 24, 1932 DOA: 03/31/2024 PCP: Okey Carlin Redbird, MD  Subjective: Patient seen and examined.  Patient has a son that lives in [place] that she says is going to visit her.  She wants to go home tomorrow before 10 AM.  She has a dentist appointment on Monday she cannot miss.  Overall her breathing is better.  She does not have a home nebulizer machine.  She is agreeable to staying this evening if she can be discharged early tomorrow morning.  Prescription sent to outpatient pharmacy today.   Hospital Course: CC: SOB, chest congestion for 4 days. On home O2 HPI: Kathryn Cuevas is a 89 y.o. female with medical history significant for COPD, chronic hypoxic respiratory failure, hypothyroidism, CAD, CKD 3, and atrial fibrillation not anticoagulated who presents with increased cough, increased sputum production, and worsening shortness of breath.   Patient has had a general sense of malaise and poor appetite for a few days, her neighbor thought she appeared short of breath 2 days ago, and the patient reports increased cough with increased sputum production and worsening shortness of breath since yesterday.  She denies fevers, chest pain, or abdominal pain.   ED Course: Upon arrival to the ED, patient is found to be afebrile and saturating mid 80s on 2 L/min of supplemental oxygen with normal HR and stable BP.  Labs are most notable for sodium 132, normal WBC, and normal proBNP.  CTA chest is negative for PE but concerning for bronchitis and bronchopneumonia.   Patient was treated in the ED with Rocephin, azithromycin, IV Solu-Medrol, and DuoNeb x 2.  Significant Events: Admitted 03/31/2024 for bronchopneumonia, COPD exacerbation   Admission Labs: WBC 8.4, HgB 13.4, plt 330 Na 132, K 4.0, CO2 of 26, BUN 21, Scr 0.95, glu 108 T. Prot 7.2, alb 3.9, AST 30, ALT 22, alk phos 152, t. Bili 0.4 Covid/rsv/flu negative Pro BNP 226   Admission Imaging  Studies: CXR Suspicion of small left pleural effusion and mild left basilar airspace disease, atelectasis versus pneumonia CTPA No evidence of pulmonary embolus. 2. Bilateral bronchial wall thickening, with retained secretions throughout the tracheobronchial tree and scattered ground-glass opacities as above, consistent with bronchitis and scattered bronchopneumonia. 3. Aortic Atherosclerosis (ICD10-I70.0). Coronary artery atherosclerosis.  Significant Labs:   Significant Imaging Studies:   Antibiotic Therapy: Anti-infectives (From admission, onward)    Start     Dose/Rate Route Frequency Ordered Stop   04/01/24 2200  azithromycin (ZITHROMAX) tablet 500 mg        500 mg Oral Daily at bedtime 04/01/24 0102 04/05/24 2159   04/01/24 2100  cefTRIAXone (ROCEPHIN) 2 g in sodium chloride  0.9 % 100 mL IVPB        2 g 200 mL/hr over 30 Minutes Intravenous Every 24 hours 04/01/24 0102 04/05/24 2059   03/31/24 2100  cefTRIAXone (ROCEPHIN) 1 g in sodium chloride  0.9 % 100 mL IVPB        1 g 200 mL/hr over 30 Minutes Intravenous  Once 03/31/24 2059 03/31/24 2257   03/31/24 2100  azithromycin (ZITHROMAX) 500 mg in sodium chloride  0.9 % 250 mL IVPB        500 mg 250 mL/hr over 60 Minutes Intravenous  Once 03/31/24 2059 03/31/24 2349       Procedures:   Consultants:     Assessment and Plan: * COPD with acute exacerbation (HCC) 04/01/24 On IV solumedrol with orders to change to po prednisone  starting tomorrow. Continue with increased supplemental O2. Normally on 1-2 liters. Currently on 5-6 liters. She has left basilar pneumonia on my review of her CT chest. Although she does not have a leukocytosis, I think a few days of IV abx are warranted. Continue with IV rocephin. On po zithromax.  04/02/24 breathing is improved.  No longer wheezing.  Now on p.o. prednisone.  Will discharge her tomorrow on p.o. Zithromax, Duricef, prednisone, DuoNebs.  Plan for discharge in the morning before 10  AM.    Bronchopneumonia 04/01/24 She has left basilar pneumonia on my review of her CT chest. Although she does not have a leukocytosis, I think a few days of IV abx are warranted. Continue with IV rocephin. On po zithromax.  04/02/24 Continue IV Rocephin.  Plan to transition to p.o. Duricef.  Continue p.o. Zithromax.   Acute and chronic respiratory failure with hypoxia (HCC) 04/01/24 due to COPD exacerbation and pneumonia. Continue with increased supplemental O2. Normally on 1-2 liters. Currently on 5-6 liters.   04/02/24 still currently on 3 L a minute.  Normally on 1 L minute.  Continue to wean supplemental oxygen to 1 L minute.  Chronic kidney disease, stage 3a (HCC) 04/01/24 stable.  04/02/24 stable.  Creatinine 0.99.   PAF (paroxysmal atrial fibrillation) (HCC) 04/01/24 on had brief run of afib during hospitalization June 2025. Cardiology felt that she did not warrant systemic anticoagulation due to only brief episode of afib. Had been on amiodarone  until 01-2024 when she was seen by Dr. Anner with cardiology and weaned off amiodarone . She is now off amiodarone . Per Dr. Genice office notes from 02-09-2024. Plan is to After six weeks off amiodarone , wear a heart monitor for two weeks to check for atrial fibrillation. - Follow up after the monitoring period to assess for any recurrence of atrial fibrillation.:  Pt will need to f/u with cardiology as outpatient.  04/02/24 stable.  Follow-up with outpatient cardiology.  Chronic respiratory failure with hypoxia (HCC) 04/01/24 acutely worsened due to COPD exacerbation and pneumonia.  Continue with increased supplemental O2. Normally on 1-2 liters. Currently on 5-6 liters.   04/02/24 chronically on 1 to 2 L of minute.  Currently exacerbated due to COPD.   Coronary artery disease involving native coronary artery of native heart without angina pectoris 04/01/24 stable. On ASA, lipitor .  04/02/24 stable.  Continue aspirin  81 mg a  day, Lipitor  80 mg daily.   Acquired hypothyroidism 04/01/24 continue synthroid  88 mcg daily.  04/02/24 stable.  Continue Synthroid  88 mcg daily.   DVT prophylaxis: enoxaparin  (LOVENOX ) injection 30 mg Start: 04/01/24 1000    Code Status: Full Code Family Communication: no family at bedside Disposition Plan: return home Reason for continuing need for hospitalization: remains on IV Rocephin. Plan for DC tomorrow AM if she remains stable.  Objective: Vitals:   04/01/24 1953 04/02/24 0030 04/02/24 0508 04/02/24 0919  BP: (!) 109/53 (!) 105/49 (!) 105/55   Pulse: 78 73 66   Resp: 16 16 16    Temp: 98.1 F (36.7 C) (!) 97.3 F (36.3 C) 97.8 F (36.6 C)   TempSrc: Oral Oral Oral   SpO2: 94% 92% 95% 94%  Weight:      Height:        Intake/Output Summary (Last 24 hours) at 04/02/2024 0956 Last data filed at 04/02/2024 0741 Gross per 24 hour  Intake 560 ml  Output --  Net 560 ml   Filed Weights   04/01/24 1329  Weight: 52.2 kg  Examination:  Physical Exam Vitals and nursing note reviewed.  Constitutional:      General: She is not in acute distress.    Appearance: She is not toxic-appearing or diaphoretic.  HENT:     Head: Normocephalic and atraumatic.  Eyes:     General: No scleral icterus. Cardiovascular:     Rate and Rhythm: Normal rate and regular rhythm.  Pulmonary:     Effort: Pulmonary effort is normal.     Breath sounds: No wheezing or rales.     Comments: No wheezing today.  Coarse breath sounds.  No rales.  No rhonchi today. Abdominal:     General: Abdomen is flat. Bowel sounds are normal.     Palpations: Abdomen is soft.  Musculoskeletal:     Right lower leg: No edema.     Left lower leg: No edema.  Skin:    General: Skin is warm and dry.     Capillary Refill: Capillary refill takes less than 2 seconds.  Neurological:     General: No focal deficit present.     Mental Status: She is alert and oriented to person, place, and time.     Data  Reviewed: I have personally reviewed following labs and imaging studies  CBC: Recent Labs  Lab 03/31/24 1804 04/01/24 0536 04/02/24 0553  WBC 8.4 6.0 20.0*  NEUTROABS 4.9  --   --   HGB 13.4 11.2* 10.0*  HCT 41.1 35.2* 30.9*  MCV 99.0 101.1* 100.3*  PLT 330 278 299   Basic Metabolic Panel: Recent Labs  Lab 03/31/24 1804 04/01/24 0536 04/02/24 0553  NA 132* 135 132*  K 4.0 4.2 4.2  CL 96* 99 99  CO2 26 26 25   GLUCOSE 108* 178* 162*  BUN 21 25* 33*  CREATININE 0.95 1.12* 0.99  CALCIUM  9.0 8.5* 8.4*   GFR: Estimated Creatinine Clearance: 31.1 mL/min (by C-G formula based on SCr of 0.99 mg/dL). Liver Function Tests: Recent Labs  Lab 03/31/24 1804  AST 30  ALT 22  ALKPHOS 152*  BILITOT 0.4  PROT 7.2  ALBUMIN 3.9   Recent Results (from the past 240 hours)  Resp panel by RT-PCR (RSV, Flu A&B, Covid) Anterior Nasal Swab     Status: None   Collection Time: 03/31/24  6:36 PM   Specimen: Anterior Nasal Swab  Result Value Ref Range Status   SARS Coronavirus 2 by RT PCR NEGATIVE NEGATIVE Final    Comment: (NOTE) SARS-CoV-2 target nucleic acids are NOT DETECTED.  The SARS-CoV-2 RNA is generally detectable in upper respiratory specimens during the acute phase of infection. The lowest concentration of SARS-CoV-2 viral copies this assay can detect is 138 copies/mL. A negative result does not preclude SARS-Cov-2 infection and should not be used as the sole basis for treatment or other patient management decisions. A negative result may occur with  improper specimen collection/handling, submission of specimen other than nasopharyngeal swab, presence of viral mutation(s) within the areas targeted by this assay, and inadequate number of viral copies(<138 copies/mL). A negative result must be combined with clinical observations, patient history, and epidemiological information. The expected result is Negative.  Fact Sheet for Patients:   bloggercourse.com  Fact Sheet for Healthcare Providers:  seriousbroker.it  This test is no t yet approved or cleared by the [place]  FDA and  has been authorized for detection and/or diagnosis of SARS-CoV-2 by FDA under an Emergency Use Authorization (EUA). This EUA will remain  in effect (meaning this test can be  used) for the duration of the COVID-19 declaration under Section 564(b)(1) of the Act, 21 U.S.C.section 360bbb-3(b)(1), unless the authorization is terminated  or revoked sooner.       Influenza A by PCR NEGATIVE NEGATIVE Final   Influenza B by PCR NEGATIVE NEGATIVE Final    Comment: (NOTE) The Xpert Xpress SARS-CoV-2/FLU/RSV plus assay is intended as an aid in the diagnosis of influenza from Nasopharyngeal swab specimens and should not be used as a sole basis for treatment. Nasal washings and aspirates are unacceptable for Xpert Xpress SARS-CoV-2/FLU/RSV testing.  Fact Sheet for Patients: bloggercourse.com  Fact Sheet for Healthcare Providers: seriousbroker.it  This test is not yet approved or cleared by the [place]  FDA and has been authorized for detection and/or diagnosis of SARS-CoV-2 by FDA under an Emergency Use Authorization (EUA). This EUA will remain in effect (meaning this test can be used) for the duration of the COVID-19 declaration under Section 564(b)(1) of the Act, 21 U.S.C. section 360bbb-3(b)(1), unless the authorization is terminated or revoked.     Resp Syncytial Virus by PCR NEGATIVE NEGATIVE Final    Comment: (NOTE) Fact Sheet for Patients: bloggercourse.com  Fact Sheet for Healthcare Providers: seriousbroker.it  This test is not yet approved or cleared by the [place]  FDA and has been authorized for detection and/or diagnosis of SARS-CoV-2 by FDA under an Emergency Use  Authorization (EUA). This EUA will remain in effect (meaning this test can be used) for the duration of the COVID-19 declaration under Section 564(b)(1) of the Act, 21 U.S.C. section 360bbb-3(b)(1), unless the authorization is terminated or revoked.  Performed at [facility] [address], [place], [place] [address]      Radiology Studies: CT Angio Chest PE W and/or Wo Contrast Result Date: 03/31/2024 CLINICAL DATA:  Short of breath, chest congestion, cough EXAM: CT ANGIOGRAPHY CHEST WITH CONTRAST TECHNIQUE: Multidetector CT imaging of the chest was performed using the standard protocol during bolus administration of intravenous contrast. Multiplanar CT image reconstructions and MIPs were obtained to evaluate the vascular anatomy. RADIATION DOSE REDUCTION: This exam was performed according to the departmental dose-optimization program which includes automated exposure control, adjustment of the mA and/or kV according to patient size and/or use of iterative reconstruction technique. CONTRAST:  75mL OMNIPAQUE  IOHEXOL  350 MG/ML SOLN COMPARISON:  03/31/2024 FINDINGS: Cardiovascular: This is a technically adequate evaluation of the pulmonary vasculature. No filling defects or pulmonary emboli. Mild cardiomegaly without pericardial effusion. No evidence of thoracic aortic aneurysm or dissection. Atherosclerosis of the aorta and coronary vasculature. Mediastinum/Nodes: No enlarged mediastinal, hilar, or axillary lymph nodes. Thyroid  gland, trachea, and esophagus demonstrate no significant findings. Lungs/Pleura: Upper lobe predominant centrilobular emphysema. There is bilateral bronchial wall thickening, with retained secretions throughout the tracheobronchial tree greatest in the lower lobes. Areas of atelectasis at the lung bases, left greater than right. Scattered ground-glass airspace disease within the upper lobes. No effusion or pneumothorax. Upper Abdomen: No acute abnormality.  Musculoskeletal: No acute or destructive bony abnormalities. Reconstructed images demonstrate no additional findings. Review of the MIP images confirms the above findings. IMPRESSION: 1. No evidence of pulmonary embolus. 2. Bilateral bronchial wall thickening, with retained secretions throughout the tracheobronchial tree and scattered ground-glass opacities as above, consistent with bronchitis and scattered bronchopneumonia. 3. Aortic Atherosclerosis (ICD10-I70.0). Coronary artery atherosclerosis. Electronically Signed   By: Ozell Daring M.D.   On: 03/31/2024 20:46   DG Chest Portable 1 View Result Date: 03/31/2024 CLINICAL DATA:  Shortness of breath EXAM: PORTABLE CHEST  1 VIEW COMPARISON:  11/23/2023, 03/20/2013 FINDINGS: Mild elevation left diaphragm. Suspicion of small left pleural effusion and mild left basilar airspace disease. Stable cardiomediastinal silhouette with aortic atherosclerosis. Streaky scarring at the right base. IMPRESSION: Suspicion of small left pleural effusion and mild left basilar airspace disease, atelectasis versus pneumonia. Electronically Signed   By: Luke Bun M.D.   On: 03/31/2024 17:53    Scheduled Meds:  aspirin  EC  81 mg Oral Daily   atorvastatin   80 mg Oral QPM   azithromycin  500 mg Oral QHS   clopidogrel   75 mg Oral Q breakfast   enoxaparin  (LOVENOX ) injection  30 mg Subcutaneous Q24H   guaiFENesin  600 mg Oral BID   ipratropium-albuterol  3 mL Nebulization TID   levothyroxine   88 mcg Oral Q0600   predniSONE  40 mg Oral Q breakfast   sodium chloride  flush  3 mL Intravenous Q12H   Continuous Infusions:  cefTRIAXone (ROCEPHIN)  IV 2 g (04/01/24 2030)     LOS: 2 days   Time spent: 55 minutes  Kathryn Door, DO  [facility]  04/02/2024, 9:56 AM

## 2024-04-02 NOTE — Evaluation (Signed)
 Occupational Therapy Evaluation Patient Details Name: Kathryn Cuevas MRN: [id number] DOB: 05-07-1932 Today's Date: 04/02/2024   History of Present Illness   Pt is a 89 year old woman admitted on 04/01/24 with shortness of breath and cough due to COPD exacerbation. PMH: chronic hypoxic respiratory failure on 1.25 L O2 at home, CAD, CKD 3, afib.     Clinical Impressions Pt typically walks with a RW and is supervised for showering and assisted for all IADLs by caregivers 24/7. Her son assists with managing finances. Pt can dress herself and toilet independently. Pt presents with impaired standing balance, with one mild LOB when backing up to chair with RW, and decreased activity tolerance. She has been educated in pursed lip breathing techniques, pacing and energy conservation strategies. Pt has been participating in [facility], recommend resumption. Will defer further OT to [facility] and ADLs with nursing staff while admitted.      If plan is discharge home, recommend the following:   A little help with walking and/or transfers;A little help with bathing/dressing/bathroom;Assistance with cooking/housework;Direct supervision/assist for medications management;Direct supervision/assist for financial management;Assist for transportation;Help with stairs or ramp for entrance     Functional Status Assessment   Patient has had a recent decline in their functional status and demonstrates the ability to make significant improvements in function in a reasonable and predictable amount of time.     Equipment Recommendations   None recommended by OT     Recommendations for Other Services         Precautions/Restrictions   Precautions Precautions: Fall Restrictions Weight Bearing Restrictions Per Provider Order: No     Mobility Bed Mobility Overal bed mobility: Modified Independent                  Transfers Overall transfer level: Needs assistance Equipment used: Rolling walker (2  wheels) Transfers: Sit to/from Stand, Bed to chair/wheelchair/BSC Sit to Stand: Supervision     Step pivot transfers: Min assist     General transfer comment: minor LOB when backing up to chair      Balance Overall balance assessment: Needs assistance   Sitting balance-Leahy Scale: Good       Standing balance-Leahy Scale: Poor Standing balance comment: reliant on RW                           ADL either performed or assessed with clinical judgement   ADL Overall ADL's : At baseline                                             Vision Baseline Vision/History: 1 Wears glasses Ability to See in Adequate Light: 0 Adequate Patient Visual Report: No change from baseline       Perception         Praxis         Pertinent Vitals/Pain Pain Assessment Pain Assessment: No/denies pain     Extremity/Trunk Assessment Upper Extremity Assessment Upper Extremity Assessment: Overall WFL for tasks assessed   Lower Extremity Assessment Lower Extremity Assessment: Defer to PT evaluation       Communication Communication Communication: Impaired Factors Affecting Communication: Hearing impaired   Cognition Arousal: Alert Behavior During Therapy: WFL for tasks assessed/performed               OT - Cognition Comments: family manages  finances and caregiver manage meds                 Following commands: Intact       Cueing  General Comments   Cueing Techniques: Verbal cues      Exercises     Shoulder Instructions      Home Living Family/patient expects to be discharged to:: Private residence Living Arrangements: Alone Available Help at Discharge: Personal care attendant;Available 24 hours/day Type of Home: House Home Access: Stairs to enter     Home Layout: Two level;Able to live on main level with bedroom/bathroom     Bathroom Shower/Tub: Arts Development Officer Toilet: Handicapped height     Home  Equipment: Agricultural Consultant (2 wheels);Shower seat;Grab bars - tub/shower;Hand held shower head          Prior Functioning/Environment Prior Level of Function : Needs assist             Mobility Comments: uses RW for ambulation, denies falls ADLs Comments: seat for showering, assisted for all IADLs and supervised for showering    OT Problem List: Decreased activity tolerance;Impaired balance (sitting and/or standing)   OT Treatment/Interventions:        OT Goals(Current goals can be found in the care plan section)       OT Frequency:       Co-evaluation              AM-PAC OT 6 Clicks Daily Activity     Outcome Measure Help from another person eating meals?: None Help from another person taking care of personal grooming?: A Little Help from another person toileting, which includes using toliet, bedpan, or urinal?: A Little Help from another person bathing (including washing, rinsing, drying)?: A Little Help from another person to put on and taking off regular upper body clothing?: A Little Help from another person to put on and taking off regular lower body clothing?: A Little 6 Click Score: 19   End of Session Equipment Utilized During Treatment: Rolling walker (2 wheels);Gait belt;Oxygen  Activity Tolerance: Patient tolerated treatment well Patient left: in chair;with call bell/phone within reach;with family/visitor present  OT Visit Diagnosis: Unsteadiness on feet (R26.81);Other (comment) (decreased activity tolerance)                Time: 1350-1420 OT Time Calculation (min): 30 min Charges:  OT General Charges $OT Visit: 1 Visit OT Evaluation $OT Eval Low Complexity: 1 Low OT Treatments $Self Care/Home Management : 8-22 mins  Mliss HERO, OTR/L Acute Rehabilitation Services Office: [phone]   Kennth Mliss Helling 04/02/2024, 2:28 PM

## 2024-04-02 NOTE — Evaluation (Signed)
 Physical Therapy Evaluation Patient Details Name: Kathryn Cuevas MRN: [id number] DOB: 1931/08/08 Today's Date: 04/02/2024  History of Present Illness  Pt is a 89 year old woman admitted on 04/01/24 with shortness of breath and cough due to COPD exacerbation. PMH: chronic hypoxic respiratory failure on 1.25 L O2 at home, CAD, CKD 3, afib.  Clinical Impression  Pt admitted with above diagnosis.  Pt agreeable to PT, motivated to mobilize.  Amb 100' with RW and CGA--SpO2= 97% at rest, 91% during amb 95% post amb on 2L throughout  Pt will benefit from HHPT at d/c (resume HH services)  Pt currently with functional limitations due to the deficits listed below (see PT Problem List). Pt will benefit from acute skilled PT to increase their independence and safety with mobility to allow discharge.           If plan is discharge home, recommend the following: A little help with walking and/or transfers;A little help with bathing/dressing/bathroom;Help with stairs or ramp for entrance;Assist for transportation;Assistance with cooking/housework   Can travel by private vehicle        Equipment Recommendations None recommended by PT  Recommendations for Other Services       Functional Status Assessment Patient has had a recent decline in their functional status and demonstrates the ability to make significant improvements in function in a reasonable and predictable amount of time.     Precautions / Restrictions Precautions Precautions: Fall Restrictions Weight Bearing Restrictions Per Provider Order: No      Mobility  Bed Mobility               General bed mobility comments: in recliner and returned to same    Transfers Overall transfer level: Needs assistance Equipment used: Rolling walker (2 wheels) Transfers: Sit to/from Stand Sit to Stand: Supervision           General transfer comment: for safety    Ambulation/Gait Ambulation/Gait assistance: Contact guard assist Gait  Distance (Feet): 100 Feet Assistive device: Rolling walker (2 wheels) Gait Pattern/deviations: Step-through pattern, Decreased stride length, Trunk flexed Gait velocity: decr     General Gait Details: cues for proper posture and breathing. pt dyspneic end of distance, denies signifiant fatigue. SpO2= 97% at rest, 91% during amb  95% post amb on 2L throughout, one brief standing rest break  Stairs            Wheelchair Mobility     Tilt Bed    Modified Rankin (Stroke Patients Only)       Balance     Sitting balance-Leahy Scale: Good       Standing balance-Leahy Scale: Poor Standing balance comment: reliant on RW                             Pertinent Vitals/Pain Pain Assessment Pain Assessment: No/denies pain    Home Living Family/patient expects to be discharged to:: Private residence Living Arrangements: Alone Available Help at Discharge: Personal care attendant;Available 24 hours/day Type of Home: House Home Access: Stairs to enter Entrance Stairs-Rails: None Entrance Stairs-Number of Steps: 2   Home Layout: Two level;Able to live on main level with bedroom/bathroom Home Equipment: Rolling Walker (2 wheels);Shower seat;Grab bars - tub/shower;Hand held shower head      Prior Function Prior Level of Function : Needs assist             Mobility Comments: uses RW for ambulation, denies falls ADLs  Comments: seat for showering, assisted for all IADLs and supervised for showering     Extremity/Trunk Assessment   Upper Extremity Assessment Upper Extremity Assessment: Overall WFL for tasks assessed;Defer to OT evaluation    Lower Extremity Assessment Lower Extremity Assessment: Overall WFL for tasks assessed       Communication   Communication Communication: Impaired Factors Affecting Communication: Hearing impaired    Cognition Arousal: Alert Behavior During Therapy: WFL for tasks assessed/performed   PT - Cognitive impairments:  No apparent impairments                         Following commands: Intact       Cueing Cueing Techniques: Verbal cues     General Comments      Exercises     Assessment/Plan    PT Assessment Patient needs continued PT services  PT Problem List Cardiopulmonary status limiting activity;Decreased activity tolerance;Decreased balance;Decreased mobility       PT Treatment Interventions DME instruction;Therapeutic exercise;Gait training;Functional mobility training;Therapeutic activities;Patient/family education    PT Goals (Current goals can be found in the Care Plan section)  Acute Rehab PT Goals PT Goal Formulation: With patient Time For Goal Achievement: 04/16/24 Potential to Achieve Goals: Good    Frequency Min 2X/week     Co-evaluation               AM-PAC PT 6 Clicks Mobility  Outcome Measure Help needed turning from your back to your side while in a flat bed without using bedrails?: A Little Help needed moving from lying on your back to sitting on the side of a flat bed without using bedrails?: A Little Help needed moving to and from a bed to a chair (including a wheelchair)?: A Little Help needed standing up from a chair using your arms (e.g., wheelchair or bedside chair)?: A Little Help needed to walk in hospital room?: A Little Help needed climbing 3-5 steps with a railing? : A Lot 6 Click Score: 17    End of Session Equipment Utilized During Treatment: Gait belt Activity Tolerance: Patient tolerated treatment well Patient left: with call bell/phone within reach;with chair alarm set;with family/visitor present   PT Visit Diagnosis: Other abnormalities of gait and mobility (R26.89)    Time: 8568-8554 PT Time Calculation (min) (ACUTE ONLY): 14 min   Charges:   PT Evaluation $PT Eval Low Complexity: 1 Low   PT General Charges $$ ACUTE PT VISIT: 1 Visit         Jazmarie Biever, PT  Acute Rehab Dept [facility])  [phone]  04/02/2024   [facility] 04/02/2024, 3:46 PM

## 2024-04-02 NOTE — Plan of Care (Incomplete)
  Problem: Education: Goal: Knowledge of disease or condition will improve Outcome: Progressing Goal: Knowledge of the prescribed therapeutic regimen will improve Outcome: Progressing   Problem: Activity: Goal: Ability to tolerate increased activity will improve Outcome: Progressing   Problem: Respiratory: Goal: Ability to maintain a clear airway will improve Outcome: Progressing Goal: Levels of oxygenation will improve Outcome: Progressing Goal: Ability to maintain adequate ventilation will improve Outcome: Progressing   Problem: Activity: Goal: Ability to tolerate increased activity will improve Outcome: Progressing   Problem: Respiratory: Goal: Ability to maintain adequate ventilation will improve Outcome: Progressing Goal: Ability to maintain a clear airway will improve Outcome: Progressing   Problem: Education: Goal: Knowledge of General Education information will improve Description: Including pain rating scale, medication(s)/side effects and non-pharmacologic comfort measures Outcome: Progressing   Problem: Health Behavior/Discharge Planning: Goal: Ability to manage health-related needs will improve Outcome: Progressing   Problem: Skin Integrity: Goal: Risk for impaired skin integrity will decrease Outcome: Progressing   Problem: Nutrition: Goal: Adequate nutrition will be maintained Outcome: Adequate for Discharge   Problem: Coping: Goal: Level of anxiety will decrease Outcome: Adequate for Discharge   Problem: Elimination: Goal: Will not experience complications related to bowel motility Outcome: Adequate for Discharge Goal: Will not experience complications related to urinary retention Outcome: Adequate for Discharge   Problem: Pain Managment: Goal: General experience of comfort will improve and/or be controlled Outcome: Adequate for Discharge   Problem: Safety: Goal: Ability to remain free from injury will improve Outcome: Adequate for  Discharge

## 2024-04-03 DIAGNOSIS — J441 Chronic obstructive pulmonary disease with (acute) exacerbation: Secondary | ICD-10-CM | POA: Diagnosis not present

## 2024-04-03 LAB — BASIC METABOLIC PANEL WITH GFR
Anion gap: 8 (ref 5–15)
BUN: 32 mg/dL — ABNORMAL HIGH (ref 8–23)
CO2: 26 mmol/L (ref 22–32)
Calcium: 8.8 mg/dL — ABNORMAL LOW (ref 8.9–10.3)
Chloride: 100 mmol/L (ref 98–111)
Creatinine, Ser: 1.33 mg/dL — ABNORMAL HIGH (ref 0.44–1.00)
GFR, Estimated: 38 mL/min — ABNORMAL LOW (ref >60)
Glucose, Bld: 106 mg/dL — ABNORMAL HIGH (ref 70–99)
Potassium: 4.5 mmol/L (ref 3.5–5.1)
Sodium: 134 mmol/L — ABNORMAL LOW (ref 135–145)

## 2024-04-03 LAB — CBC
HCT: 34.2 % — ABNORMAL LOW (ref 36.0–46.0)
Hemoglobin: 10.7 g/dL — ABNORMAL LOW (ref 12.0–15.0)
MCH: 31.7 pg (ref 26.0–34.0)
MCHC: 31.3 g/dL (ref 30.0–36.0)
MCV: 101.2 fL — ABNORMAL HIGH (ref 80.0–100.0)
Platelets: 346 K/uL (ref 150–400)
RBC: 3.38 MIL/uL — ABNORMAL LOW (ref 3.87–5.11)
RDW: 15.2 % (ref 11.5–15.5)
WBC: 16.6 K/uL — ABNORMAL HIGH (ref 4.0–10.5)
nRBC: 0 % (ref 0.0–0.2)

## 2024-04-03 MED ORDER — ENOXAPARIN SODIUM 30 MG/0.3ML IJ SOSY: 30.0000 mg | PREFILLED_SYRINGE | INTRAMUSCULAR | Status: DC

## 2024-04-03 NOTE — Discharge Summary (Signed)
 [facility] Physician Discharge Summary   Patient name: Kathryn Cuevas  Admit date:     03/31/2024  Discharge date: 04/03/2024  Attending Physician: CHARLTON EVALENE RAMAN [[id number]]  Discharge Physician: Camellia Door   PCP: Okey Carlin Redbird, MD  Admitted From: Home  Disposition:  Home  Recommendations for Outpatient Follow-up:  Follow up with PCP in 1-2 weeks Follow up with outpatient cardiology  Home Health:Yes. Home health PT/OT Equipment/Devices: Oxygen 2 L/min. Pt already has home O2 Home nebulizer machine - will be arranged by CM/TOC prior to discharge  Discharge Condition:Stable CODE STATUS:FULL Diet recommendation: Regular Fluid Restriction: None  Hospital Summary: CC: SOB, chest congestion for 4 days. On home O2 HPI: Kathryn Cuevas is a 89 y.o. female with medical history significant for COPD, chronic hypoxic respiratory failure, hypothyroidism, CAD, CKD 3, and atrial fibrillation not anticoagulated who presents with increased cough, increased sputum production, and worsening shortness of breath.   Patient has had a general sense of malaise and poor appetite for a few days, her neighbor thought she appeared short of breath 2 days ago, and the patient reports increased cough with increased sputum production and worsening shortness of breath since yesterday.  She denies fevers, chest pain, or abdominal pain.   ED Course: Upon arrival to the ED, patient is found to be afebrile and saturating mid 80s on 2 L/min of supplemental oxygen with normal HR and stable BP.  Labs are most notable for sodium 132, normal WBC, and normal proBNP.  CTA chest is negative for PE but concerning for bronchitis and bronchopneumonia.   Patient was treated in the ED with Rocephin, azithromycin, IV Solu-Medrol, and DuoNeb x 2.  Significant Events: Admitted 03/31/2024 for bronchopneumonia, COPD exacerbation   Admission Labs: WBC 8.4, HgB 13.4, plt 330 Na 132, K 4.0, CO2 of 26, BUN 21, Scr 0.95, glu  108 T. Prot 7.2, alb 3.9, AST 30, ALT 22, alk phos 152, t. Bili 0.4 Covid/rsv/flu negative Pro BNP 226   Admission Imaging Studies: CXR Suspicion of small left pleural effusion and mild left basilar airspace disease, atelectasis versus pneumonia CTPA No evidence of pulmonary embolus. 2. Bilateral bronchial wall thickening, with retained secretions throughout the tracheobronchial tree and scattered ground-glass opacities as above, consistent with bronchitis and scattered bronchopneumonia. 3. Aortic Atherosclerosis (ICD10-I70.0). Coronary artery atherosclerosis.  Significant Labs:   Significant Imaging Studies:   Antibiotic Therapy: Anti-infectives (From admission, onward)    Start     Dose/Rate Route Frequency Ordered Stop   04/01/24 2200  azithromycin (ZITHROMAX) tablet 500 mg        500 mg Oral Daily at bedtime 04/01/24 0102 04/05/24 2159   04/01/24 2100  cefTRIAXone (ROCEPHIN) 2 g in sodium chloride  0.9 % 100 mL IVPB        2 g 200 mL/hr over 30 Minutes Intravenous Every 24 hours 04/01/24 0102 04/05/24 2059   03/31/24 2100  cefTRIAXone (ROCEPHIN) 1 g in sodium chloride  0.9 % 100 mL IVPB        1 g 200 mL/hr over 30 Minutes Intravenous  Once 03/31/24 2059 03/31/24 2257   03/31/24 2100  azithromycin (ZITHROMAX) 500 mg in sodium chloride  0.9 % 250 mL IVPB        500 mg 250 mL/hr over 60 Minutes Intravenous  Once 03/31/24 2059 03/31/24 2349       Procedures:   Consultants:    Hospital Course by Problem: * COPD with acute exacerbation (HCC) 04/01/24 On IV solumedrol with orders  to change to po prednisone starting tomorrow. Continue with increased supplemental O2. Normally on 1-2 liters. Currently on 5-6 liters. She has left basilar pneumonia on my review of her CT chest. Although she does not have a leukocytosis, I think a few days of IV abx are warranted. Continue with IV rocephin. On po zithromax.  04/02/24 breathing is improved.  No longer wheezing.  Now on p.o.  prednisone.  Will discharge her tomorrow on p.o. Zithromax, Duricef, prednisone, DuoNebs.  Plan for discharge in the morning before 10 AM.  04/03/24 DC to home. Home nebulizer machine ordered for her. DC home with prednisone, nebs, steroids. F/u with PCP. Home health PT/OT ordered. Leukocytosis due to steroids not worsening infection. She is stable for DC to home.     Bronchopneumonia 04/01/24 She has left basilar pneumonia on my review of her CT chest. Although she does not have a leukocytosis, I think a few days of IV abx are warranted. Continue with IV rocephin. On po zithromax.  04/02/24 Continue IV Rocephin.  Plan to transition to p.o. Duricef.  Continue p.o. Zithromax.  04/03/24 change to po duricef and DC to home with duricef and zithromax.   Acute and chronic respiratory failure with hypoxia (HCC) 04/01/24 due to COPD exacerbation and pneumonia. Continue with increased supplemental O2. Normally on 1-2 liters. Currently on 5-6 liters.   04/02/24 still currently on 3 L a minute.  Normally on 1 L minute.  Continue to wean supplemental oxygen to 1 L minute.  04/03/24 weaned down to 2 L/min. Pt already has home O2.  Chronic kidney disease, stage 3a (HCC) 04/01/24 stable.  04/02/24 stable.  Creatinine 0.99.  04/03/24 stable.   PAF (paroxysmal atrial fibrillation) (HCC) 04/01/24 on had brief run of afib during hospitalization June 2025. Cardiology felt that she did not warrant systemic anticoagulation due to only brief episode of afib. Had been on amiodarone  until 01-2024 when she was seen by Dr. Anner with cardiology and weaned off amiodarone . She is now off amiodarone . Per Dr. Genice office notes from 02-09-2024. Plan is to After six weeks off amiodarone , wear a heart monitor for two weeks to check for atrial fibrillation. - Follow up after the monitoring period to assess for any recurrence of atrial fibrillation.:  Pt will need to f/u with cardiology as outpatient.  04/02/24  stable.  Follow-up with outpatient cardiology.  04/03/24 f/u with outpatient cards.  Chronic respiratory failure with hypoxia (HCC) 04/01/24 acutely worsened due to COPD exacerbation and pneumonia.  Continue with increased supplemental O2. Normally on 1-2 liters. Currently on 5-6 liters.   04/02/24 chronically on 1 to 2 L of minute.  Currently exacerbated due to COPD.  04/03/24 down to 2 L/min.   Coronary artery disease involving native coronary artery of native heart without angina pectoris 04/01/24 stable. On ASA, lipitor .  04/02/24 stable.  Continue aspirin  81 mg a day, Lipitor  80 mg daily.  04/03/24 stable.   Acquired hypothyroidism 04/01/24 continue synthroid  88 mcg daily.  04/02/24 stable.  Continue Synthroid  88 mcg daily.  04/03/24 stable.     Discharge Diagnoses:  Principal Problem:   COPD with acute exacerbation (HCC) Active Problems:   Acute and chronic respiratory failure with hypoxia (HCC)   Bronchopneumonia   Acquired hypothyroidism   Coronary artery disease involving native coronary artery of native heart without angina pectoris   Chronic respiratory failure with hypoxia (HCC)   PAF (paroxysmal atrial fibrillation) (HCC)   Chronic kidney disease, stage 3a (HCC)  Discharge Instructions  Discharge Instructions     Call MD for:  difficulty breathing, headache or visual disturbances   Complete by: As directed    Call MD for:  extreme fatigue   Complete by: As directed    Call MD for:  hives   Complete by: As directed    Call MD for:  persistant dizziness or light-headedness   Complete by: As directed    Call MD for:  persistant nausea and vomiting   Complete by: As directed    Call MD for:  redness, tenderness, or signs of infection (pain, swelling, redness, odor or green/yellow discharge around incision site)   Complete by: As directed    Call MD for:  severe uncontrolled pain   Complete by: As directed    Call MD for:  temperature >100.4    Complete by: As directed    Diet general   Complete by: As directed    Discharge instructions   Complete by: As directed    1. Follow up with your primary care provider in 1-2 weeks following discharge from hospital.   Increase activity slowly   Complete by: As directed       Allergies as of 04/03/2024       Reactions   Levothyroxine  Other (See Comments)   Shoulder/neck pain. Can tolerate Synthroid .    Augmentin [amoxicillin-pot Clavulanate] Rash   Sulfa Antibiotics Rash        Medication List     TAKE these medications    aspirin  EC 81 MG tablet Take 1 tablet (81 mg total) by mouth daily. Swallow whole.   atorvastatin  80 MG tablet Commonly known as: LIPITOR  Take 1 tablet (80 mg total) by mouth daily.   azithromycin 500 MG tablet Commonly known as: ZITHROMAX Take 1 tablet (500 mg total) by mouth at bedtime for 2 days. Start taking on: April 04, 2024   cefadroxil 500 MG capsule Commonly known as: DURICEF Take 1 capsule (500 mg total) by mouth 2 (two) times daily for 4 days. Start taking on: April 04, 2024   clopidogrel  75 MG tablet Commonly known as: PLAVIX  Take 1 tablet (75 mg total) by mouth daily with breakfast.   fluticasone 50 MCG/ACT nasal spray Commonly known as: FLONASE Place 2 sprays into both nostrils daily as needed.   furosemide  20 MG tablet Commonly known as: LASIX  Take 1 tablet (20 mg total) by mouth daily.   guaiFENesin 600 MG 12 hr tablet Commonly known as: MUCINEX Take 600 mg by mouth 2 (two) times daily.   ipratropium-albuterol 0.5-2.5 (3) MG/3ML Soln Commonly known as: DUONEB Take 3 mLs by nebulization 3 (three) times daily for 7 days, THEN 3 mLs every 6 (six) hours as needed (SOB, wheezing). Start taking on: April 03, 2024   loratadine 10 MG tablet Commonly known as: CLARITIN Take 10 mg by mouth daily.   nitroGLYCERIN  0.4 MG SL tablet Commonly known as: NITROSTAT  Place 1 tablet (0.4 mg total) under the tongue every 5  (five) minutes x 3 doses as needed for chest pain.   predniSONE 20 MG tablet Commonly known as: DELTASONE Take 2 tablets (40 mg total) by mouth daily with breakfast for 7 days. Start taking on: April 04, 2024   Synthroid  88 MCG tablet Generic drug: levothyroxine  Take 88 mcg by mouth daily.               Durable Medical Equipment  (From admission, onward)  Start     Ordered   04/02/24 0811  For home use only DME Nebulizer machine  Once       Question Answer Comment  Patient needs a nebulizer to treat with the following condition COPD (chronic obstructive pulmonary disease) (HCC)   Length of Need Lifetime   Additional equipment included Filter   Additional equipment included Administration kit      04/02/24 0810            Allergies  Allergen Reactions   Levothyroxine  Other (See Comments)    Shoulder/neck pain. Can tolerate Synthroid .    Augmentin [Amoxicillin-Pot Clavulanate] Rash   Sulfa Antibiotics Rash    Discharge Exam: Vitals:   04/02/24 2101 04/03/24 0615  BP: (!) 109/51 115/60  Pulse: 69 60  Resp: 17 20  Temp: (!) 97.5 F (36.4 C) (!) 97.5 F (36.4 C)  SpO2: 95% 97%    Physical Exam Vitals and nursing note reviewed.  Constitutional:      General: She is not in acute distress.    Appearance: She is not toxic-appearing or diaphoretic.  HENT:     Head: Normocephalic and atraumatic.  Cardiovascular:     Rate and Rhythm: Normal rate and regular rhythm.  Pulmonary:     Effort: Pulmonary effort is normal. No respiratory distress.     Breath sounds: No wheezing.  Abdominal:     General: Bowel sounds are normal. There is no distension.     Palpations: Abdomen is soft.  Skin:    General: Skin is warm and dry.     Capillary Refill: Capillary refill takes less than 2 seconds.  Neurological:     Mental Status: She is alert and oriented to person, place, and time.     The results of significant diagnostics from this  hospitalization (including imaging, microbiology, ancillary and laboratory) are listed below for reference.    Microbiology: Recent Results (from the past 240 hours)  Resp panel by RT-PCR (RSV, Flu A&B, Covid) Anterior Nasal Swab     Status: None   Collection Time: 03/31/24  6:36 PM   Specimen: Anterior Nasal Swab  Result Value Ref Range Status   SARS Coronavirus 2 by RT PCR NEGATIVE NEGATIVE Final    Comment: (NOTE) SARS-CoV-2 target nucleic acids are NOT DETECTED.  The SARS-CoV-2 RNA is generally detectable in upper respiratory specimens during the acute phase of infection. The lowest concentration of SARS-CoV-2 viral copies this assay can detect is 138 copies/mL. A negative result does not preclude SARS-Cov-2 infection and should not be used as the sole basis for treatment or other patient management decisions. A negative result may occur with  improper specimen collection/handling, submission of specimen other than nasopharyngeal swab, presence of viral mutation(s) within the areas targeted by this assay, and inadequate number of viral copies(<138 copies/mL). A negative result must be combined with clinical observations, patient history, and epidemiological information. The expected result is Negative.  Fact Sheet for Patients:  bloggercourse.com  Fact Sheet for Healthcare Providers:  seriousbroker.it  This test is no t yet approved or cleared by the [place]  FDA and  has been authorized for detection and/or diagnosis of SARS-CoV-2 by FDA under an Emergency Use Authorization (EUA). This EUA will remain  in effect (meaning this test can be used) for the duration of the COVID-19 declaration under Section 564(b)(1) of the Act, 21 U.S.C.section 360bbb-3(b)(1), unless the authorization is terminated  or revoked sooner.       Influenza A by  PCR NEGATIVE NEGATIVE Final   Influenza B by PCR NEGATIVE NEGATIVE Final     Comment: (NOTE) The Xpert Xpress SARS-CoV-2/FLU/RSV plus assay is intended as an aid in the diagnosis of influenza from Nasopharyngeal swab specimens and should not be used as a sole basis for treatment. Nasal washings and aspirates are unacceptable for Xpert Xpress SARS-CoV-2/FLU/RSV testing.  Fact Sheet for Patients: bloggercourse.com  Fact Sheet for Healthcare Providers: seriousbroker.it  This test is not yet approved or cleared by the [place]  FDA and has been authorized for detection and/or diagnosis of SARS-CoV-2 by FDA under an Emergency Use Authorization (EUA). This EUA will remain in effect (meaning this test can be used) for the duration of the COVID-19 declaration under Section 564(b)(1) of the Act, 21 U.S.C. section 360bbb-3(b)(1), unless the authorization is terminated or revoked.     Resp Syncytial Virus by PCR NEGATIVE NEGATIVE Final    Comment: (NOTE) Fact Sheet for Patients: bloggercourse.com  Fact Sheet for Healthcare Providers: seriousbroker.it  This test is not yet approved or cleared by the [place]  FDA and has been authorized for detection and/or diagnosis of SARS-CoV-2 by FDA under an Emergency Use Authorization (EUA). This EUA will remain in effect (meaning this test can be used) for the duration of the COVID-19 declaration under Section 564(b)(1) of the Act, 21 U.S.C. section 360bbb-3(b)(1), unless the authorization is terminated or revoked.  Performed at [facility], [address], [place], [place] [address]      Labs:  Basic Metabolic Panel: Recent Labs  Lab 03/31/24 1804 04/01/24 0536 04/02/24 0553 04/03/24 0607  NA 132* 135 132* 134*  K 4.0 4.2 4.2 4.5  CL 96* 99 99 100  CO2 26 26 25 26   GLUCOSE 108* 178* 162* 106*  BUN 21 25* 33* 32*  CREATININE 0.95 1.12* 0.99 1.33*  CALCIUM  9.0 8.5* 8.4* 8.8*    Liver Function Tests: Recent Labs  Lab 03/31/24 1804  AST 30  ALT 22  ALKPHOS 152*  BILITOT 0.4  PROT 7.2  ALBUMIN 3.9   CBC: Recent Labs  Lab 03/31/24 1804 04/01/24 0536 04/02/24 0553 04/03/24 0607  WBC 8.4 6.0 20.0* 16.6*  NEUTROABS 4.9  --   --   --   HGB 13.4 11.2* 10.0* 10.7*  HCT 41.1 35.2* 30.9* 34.2*  MCV 99.0 101.1* 100.3* 101.2*  PLT 330 278 299 346   Urinalysis    Component Value Date/Time   COLORURINE YELLOW 09/25/2019 0738   APPEARANCEUR CLEAR 09/25/2019 0738   LABSPEC 1.005 09/25/2019 0738   PHURINE 6.0 09/25/2019 0738   GLUCOSEU NEGATIVE 09/25/2019 0738   HGBUR SMALL (A) 09/25/2019 0738   BILIRUBINUR NEGATIVE 09/25/2019 0738   KETONESUR NEGATIVE 09/25/2019 0738   PROTEINUR NEGATIVE 09/25/2019 0738   UROBILINOGEN 0.2 03/20/2013 1405   NITRITE NEGATIVE 09/25/2019 0738   LEUKOCYTESUR MODERATE (A) 09/25/2019 0738   Sepsis Labs Recent Labs  Lab 03/31/24 1804 04/01/24 0536 04/02/24 0553 04/03/24 0607  WBC 8.4 6.0 20.0* 16.6*    Procedures/Studies: CT Angio Chest PE W and/or Wo Contrast Result Date: 03/31/2024 CLINICAL DATA:  Short of breath, chest congestion, cough EXAM: CT ANGIOGRAPHY CHEST WITH CONTRAST TECHNIQUE: Multidetector CT imaging of the chest was performed using the standard protocol during bolus administration of intravenous contrast. Multiplanar CT image reconstructions and MIPs were obtained to evaluate the vascular anatomy. RADIATION DOSE REDUCTION: This exam was performed according to the departmental dose-optimization program which includes automated exposure control, adjustment of the mA and/or  kV according to patient size and/or use of iterative reconstruction technique. CONTRAST:  75mL OMNIPAQUE  IOHEXOL  350 MG/ML SOLN COMPARISON:  03/31/2024 FINDINGS: Cardiovascular: This is a technically adequate evaluation of the pulmonary vasculature. No filling defects or pulmonary emboli. Mild cardiomegaly without pericardial effusion. No  evidence of thoracic aortic aneurysm or dissection. Atherosclerosis of the aorta and coronary vasculature. Mediastinum/Nodes: No enlarged mediastinal, hilar, or axillary lymph nodes. Thyroid  gland, trachea, and esophagus demonstrate no significant findings. Lungs/Pleura: Upper lobe predominant centrilobular emphysema. There is bilateral bronchial wall thickening, with retained secretions throughout the tracheobronchial tree greatest in the lower lobes. Areas of atelectasis at the lung bases, left greater than right. Scattered ground-glass airspace disease within the upper lobes. No effusion or pneumothorax. Upper Abdomen: No acute abnormality. Musculoskeletal: No acute or destructive bony abnormalities. Reconstructed images demonstrate no additional findings. Review of the MIP images confirms the above findings. IMPRESSION: 1. No evidence of pulmonary embolus. 2. Bilateral bronchial wall thickening, with retained secretions throughout the tracheobronchial tree and scattered ground-glass opacities as above, consistent with bronchitis and scattered bronchopneumonia. 3. Aortic Atherosclerosis (ICD10-I70.0). Coronary artery atherosclerosis. Electronically Signed   By: Ozell Daring M.D.   On: 03/31/2024 20:46   DG Chest Portable 1 View Result Date: 03/31/2024 CLINICAL DATA:  Shortness of breath EXAM: PORTABLE CHEST 1 VIEW COMPARISON:  11/23/2023, 03/20/2013 FINDINGS: Mild elevation left diaphragm. Suspicion of small left pleural effusion and mild left basilar airspace disease. Stable cardiomediastinal silhouette with aortic atherosclerosis. Streaky scarring at the right base. IMPRESSION: Suspicion of small left pleural effusion and mild left basilar airspace disease, atelectasis versus pneumonia. Electronically Signed   By: Luke Bun M.D.   On: 03/31/2024 17:53    Time coordinating discharge: 60 mins  SIGNED:  Camellia Door, DO [facility] 04/03/24, 7:33 AM

## 2024-04-03 NOTE — TOC Progression Note (Addendum)
 Transition of Care [facility]) - Progression Note    Patient Details  Name: Kathryn Cuevas MRN: [id number] Date of Birth: 1931-09-14  [facility] CM/SW Contact  Lorraine LILLETTE [place], [place] Phone Number: 04/03/2024, 8:30 AM  Clinical Narrative:     [place] from RN- pt would like to DC early- there are orders for DME and HH.  CSW agreed to work on orders to ensure in place for DC. Son will pick pt up this AM, CSW contacted him to verify existing 02 provider and Choice. Pt uses [facility] for an RN , pt is satisfied with both and they know pt- prefers they be initial referral.   CSW reached out to refer to each in Epic.  [place] to RN advising orders in, ICM follow until DC.   Addendum: Both agencies accepted patient and neb should be delivered to hospital soon for pt DC. No further ICM needs.   Expected Discharge Plan: Home w Home Health Services Barriers to Discharge: No Barriers Identified               Expected Discharge Plan and Services In-house Referral: Clinical Social Work   Post Acute Care Choice: Durable Medical Equipment Living arrangements for the past 2 months: Single Family Home Expected Discharge Date: 04/03/24               DME Arranged: Nebulizer machine DME Agency: [facility]       HH Arranged: Social Work   Date HH Agency Contacted: 04/03/24 Time HH Agency Contacted: [phone]     Social Drivers of Health (SDOH) Interventions SDOH Screenings   Food Insecurity: No Food Insecurity (04/01/2024)  Housing: Low Risk  (04/01/2024)  Transportation Needs: No Transportation Needs (04/01/2024)  Utilities: Not At Risk (04/01/2024)  Social Connections: Moderately Isolated (04/01/2024)  Tobacco Use: Medium Risk (04/01/2024)    Readmission Risk Interventions    10/29/2023    2:00 PM  Readmission Risk Prevention Plan  Post Dischage Appt Complete  Medication Screening Complete  Transportation Screening Complete

## 2024-04-03 NOTE — Progress Notes (Signed)
 PROGRESS NOTE    Kathryn Cuevas  FMW:[id number] DOB: [birth year] DOA: 03/31/2024 PCP: Okey Carlin Redbird, MD  Subjective: Pt seen and examined. O2 weaned to 2 L/min. Pt feels ready to go home. Son will come by the hospital around 9:30 AM to pick her up.  I advised her due to the upcoming cold weather predicted on Monday, that she stay indoors after she gets home from her dental appointment.  Patient is medically stable for discharge.   Hospital Course: CC: SOB, chest congestion for 4 days. On home O2 HPI: Kathryn Cuevas is a 89 y.o. female with medical history significant for COPD, chronic hypoxic respiratory failure, hypothyroidism, CAD, CKD 3, and atrial fibrillation not anticoagulated who presents with increased cough, increased sputum production, and worsening shortness of breath.   Patient has had a general sense of malaise and poor appetite for a few days, her neighbor thought she appeared short of breath 2 days ago, and the patient reports increased cough with increased sputum production and worsening shortness of breath since yesterday.  She denies fevers, chest pain, or abdominal pain.   ED Course: Upon arrival to the ED, patient is found to be afebrile and saturating mid 80s on 2 L/min of supplemental oxygen with normal HR and stable BP.  Labs are most notable for sodium 132, normal WBC, and normal proBNP.  CTA chest is negative for PE but concerning for bronchitis and bronchopneumonia.   Patient was treated in the ED with Rocephin, azithromycin, IV Solu-Medrol, and DuoNeb x 2.  Significant Events: Admitted 03/31/2024 for bronchopneumonia, COPD exacerbation   Admission Labs: WBC 8.4, HgB 13.4, plt 330 Na 132, K 4.0, CO2 of 26, BUN 21, Scr 0.95, glu 108 T. Prot 7.2, alb 3.9, AST 30, ALT 22, alk phos 152, t. Bili 0.4 Covid/rsv/flu negative Pro BNP 226   Admission Imaging Studies: CXR Suspicion of small left pleural effusion and mild left basilar airspace disease, atelectasis versus  pneumonia CTPA No evidence of pulmonary embolus. 2. Bilateral bronchial wall thickening, with retained secretions throughout the tracheobronchial tree and scattered ground-glass opacities as above, consistent with bronchitis and scattered bronchopneumonia. 3. Aortic Atherosclerosis (ICD10-I70.0). Coronary artery atherosclerosis.  Significant Labs:   Significant Imaging Studies:   Antibiotic Therapy: Anti-infectives (From admission, onward)    Start     Dose/Rate Route Frequency Ordered Stop   04/01/24 2200  azithromycin (ZITHROMAX) tablet 500 mg        500 mg Oral Daily at bedtime 04/01/24 0102 04/05/24 2159   04/01/24 2100  cefTRIAXone (ROCEPHIN) 2 g in sodium chloride  0.9 % 100 mL IVPB        2 g 200 mL/hr over 30 Minutes Intravenous Every 24 hours 04/01/24 0102 04/05/24 2059   03/31/24 2100  cefTRIAXone (ROCEPHIN) 1 g in sodium chloride  0.9 % 100 mL IVPB        1 g 200 mL/hr over 30 Minutes Intravenous  Once 03/31/24 2059 03/31/24 2257   03/31/24 2100  azithromycin (ZITHROMAX) 500 mg in sodium chloride  0.9 % 250 mL IVPB        500 mg 250 mL/hr over 60 Minutes Intravenous  Once 03/31/24 2059 03/31/24 2349       Procedures:   Consultants:     Assessment and Plan: * COPD with acute exacerbation (HCC) 04/01/24 On IV solumedrol with orders to change to po prednisone starting tomorrow. Continue with increased supplemental O2. Normally on 1-2 liters. Currently on 5-6 liters. She has left basilar pneumonia  on my review of her CT chest. Although she does not have a leukocytosis, I think a few days of IV abx are warranted. Continue with IV rocephin. On po zithromax.  04/02/24 breathing is improved.  No longer wheezing.  Now on p.o. prednisone.  Will discharge her tomorrow on p.o. Zithromax, Duricef, prednisone, DuoNebs.  Plan for discharge in the morning before 10 AM.  04/03/24 DC to home. Home nebulizer machine ordered for her. DC home with prednisone, nebs, steroids. F/u with PCP.  Home health PT/OT ordered.     Bronchopneumonia 04/01/24 She has left basilar pneumonia on my review of her CT chest. Although she does not have a leukocytosis, I think a few days of IV abx are warranted. Continue with IV rocephin. On po zithromax.  04/02/24 Continue IV Rocephin.  Plan to transition to p.o. Duricef.  Continue p.o. Zithromax.  04/03/24 change to po duricef and DC to home with duricef and zithromax.   Acute and chronic respiratory failure with hypoxia (HCC) 04/01/24 due to COPD exacerbation and pneumonia. Continue with increased supplemental O2. Normally on 1-2 liters. Currently on 5-6 liters.   04/02/24 still currently on 3 L a minute.  Normally on 1 L minute.  Continue to wean supplemental oxygen to 1 L minute.  04/03/24 weaned down to 2 L/min. Pt already has home O2.  Chronic kidney disease, stage 3a (HCC) 04/01/24 stable.  04/02/24 stable.  Creatinine 0.99.  04/03/24 stable.   PAF (paroxysmal atrial fibrillation) (HCC) 04/01/24 on had brief run of afib during hospitalization June 2025. Cardiology felt that she did not warrant systemic anticoagulation due to only brief episode of afib. Had been on amiodarone  until 01-2024 when she was seen by Dr. Anner with cardiology and weaned off amiodarone . She is now off amiodarone . Per Dr. Genice office notes from 02-09-2024. Plan is to After six weeks off amiodarone , wear a heart monitor for two weeks to check for atrial fibrillation. - Follow up after the monitoring period to assess for any recurrence of atrial fibrillation.:  Pt will need to f/u with cardiology as outpatient.  04/02/24 stable.  Follow-up with outpatient cardiology.  04/03/24 f/u with outpatient cards.  Chronic respiratory failure with hypoxia (HCC) 04/01/24 acutely worsened due to COPD exacerbation and pneumonia.  Continue with increased supplemental O2. Normally on 1-2 liters. Currently on 5-6 liters.   04/02/24 chronically on 1 to 2 L of minute.   Currently exacerbated due to COPD.  04/03/24 down to 2 L/min.   Coronary artery disease involving native coronary artery of native heart without angina pectoris 04/01/24 stable. On ASA, lipitor .  04/02/24 stable.  Continue aspirin  81 mg a day, Lipitor  80 mg daily.  04/03/24 stable.   Acquired hypothyroidism 04/01/24 continue synthroid  88 mcg daily.  04/02/24 stable.  Continue Synthroid  88 mcg daily.  04/03/24 stable.    DVT prophylaxis: enoxaparin  (LOVENOX ) injection 40 mg Start: 04/03/24 1000     Code Status: Full Code Family Communication: no family at bedside. Pt is decisional. Disposition Plan: return home Reason for continuing need for hospitalization: stable for DC  Objective: Vitals:   04/02/24 1349 04/02/24 1959 04/02/24 2101 04/03/24 0615  BP:   (!) 109/51 115/60  Pulse:   69 60  Resp:   17 20  Temp:   (!) 97.5 F (36.4 C) (!) 97.5 F (36.4 C)  TempSrc:   Oral   SpO2: 98% 97% 95% 97%  Weight:      Height:  Intake/Output Summary (Last 24 hours) at 04/03/2024 0725 Last data filed at 04/02/2024 0741 Gross per 24 hour  Intake 120 ml  Output --  Net 120 ml   Filed Weights   04/01/24 1329  Weight: 52.2 kg    Examination:  Physical Exam Vitals and nursing note reviewed.  Constitutional:      General: She is not in acute distress.    Appearance: She is not toxic-appearing or diaphoretic.  HENT:     Head: Normocephalic and atraumatic.  Cardiovascular:     Rate and Rhythm: Normal rate and regular rhythm.  Pulmonary:     Effort: Pulmonary effort is normal. No respiratory distress.     Breath sounds: No wheezing.  Abdominal:     General: Bowel sounds are normal. There is no distension.     Palpations: Abdomen is soft.  Skin:    General: Skin is warm and dry.     Capillary Refill: Capillary refill takes less than 2 seconds.  Neurological:     Mental Status: She is alert and oriented to person, place, and time.     Data Reviewed: I  have personally reviewed following labs and imaging studies  CBC: Recent Labs  Lab 03/31/24 1804 04/01/24 0536 04/02/24 0553 04/03/24 0607  WBC 8.4 6.0 20.0* 16.6*  NEUTROABS 4.9  --   --   --   HGB 13.4 11.2* 10.0* 10.7*  HCT 41.1 35.2* 30.9* 34.2*  MCV 99.0 101.1* 100.3* 101.2*  PLT 330 278 299 346   Basic Metabolic Panel: Recent Labs  Lab 03/31/24 1804 04/01/24 0536 04/02/24 0553 04/03/24 0607  NA 132* 135 132* 134*  K 4.0 4.2 4.2 4.5  CL 96* 99 99 100  CO2 26 26 25 26   GLUCOSE 108* 178* 162* 106*  BUN 21 25* 33* 32*  CREATININE 0.95 1.12* 0.99 1.33*  CALCIUM  9.0 8.5* 8.4* 8.8*   GFR: Estimated Creatinine Clearance: 23.2 mL/min (A) (by C-G formula based on SCr of 1.33 mg/dL (H)). Liver Function Tests: Recent Labs  Lab 03/31/24 1804  AST 30  ALT 22  ALKPHOS 152*  BILITOT 0.4  PROT 7.2  ALBUMIN 3.9    Recent Results (from the past 240 hours)  Resp panel by RT-PCR (RSV, Flu A&B, Covid) Anterior Nasal Swab     Status: None   Collection Time: 03/31/24  6:36 PM   Specimen: Anterior Nasal Swab  Result Value Ref Range Status   SARS Coronavirus 2 by RT PCR NEGATIVE NEGATIVE Final    Comment: (NOTE) SARS-CoV-2 target nucleic acids are NOT DETECTED.  The SARS-CoV-2 RNA is generally detectable in upper respiratory specimens during the acute phase of infection. The lowest concentration of SARS-CoV-2 viral copies this assay can detect is 138 copies/mL. A negative result does not preclude SARS-Cov-2 infection and should not be used as the sole basis for treatment or other patient management decisions. A negative result may occur with  improper specimen collection/handling, submission of specimen other than nasopharyngeal swab, presence of viral mutation(s) within the areas targeted by this assay, and inadequate number of viral copies(<138 copies/mL). A negative result must be combined with clinical observations, patient history, and epidemiological information.  The expected result is Negative.  Fact Sheet for Patients:  bloggercourse.com  Fact Sheet for Healthcare Providers:  seriousbroker.it  This test is no t yet approved or cleared by the [place]  FDA and  has been authorized for detection and/or diagnosis of SARS-CoV-2 by FDA under an Emergency Use Authorization (  EUA). This EUA will remain  in effect (meaning this test can be used) for the duration of the COVID-19 declaration under Section 564(b)(1) of the Act, 21 U.S.C.section 360bbb-3(b)(1), unless the authorization is terminated  or revoked sooner.       Influenza A by PCR NEGATIVE NEGATIVE Final   Influenza B by PCR NEGATIVE NEGATIVE Final    Comment: (NOTE) The Xpert Xpress SARS-CoV-2/FLU/RSV plus assay is intended as an aid in the diagnosis of influenza from Nasopharyngeal swab specimens and should not be used as a sole basis for treatment. Nasal washings and aspirates are unacceptable for Xpert Xpress SARS-CoV-2/FLU/RSV testing.  Fact Sheet for Patients: bloggercourse.com  Fact Sheet for Healthcare Providers: seriousbroker.it  This test is not yet approved or cleared by the [place]  FDA and has been authorized for detection and/or diagnosis of SARS-CoV-2 by FDA under an Emergency Use Authorization (EUA). This EUA will remain in effect (meaning this test can be used) for the duration of the COVID-19 declaration under Section 564(b)(1) of the Act, 21 U.S.C. section 360bbb-3(b)(1), unless the authorization is terminated or revoked.     Resp Syncytial Virus by PCR NEGATIVE NEGATIVE Final    Comment: (NOTE) Fact Sheet for Patients: bloggercourse.com  Fact Sheet for Healthcare Providers: seriousbroker.it  This test is not yet approved or cleared by the [place]  FDA and has been authorized for detection and/or  diagnosis of SARS-CoV-2 by FDA under an Emergency Use Authorization (EUA). This EUA will remain in effect (meaning this test can be used) for the duration of the COVID-19 declaration under Section 564(b)(1) of the Act, 21 U.S.C. section 360bbb-3(b)(1), unless the authorization is terminated or revoked.  Performed at [facility], [address], [place], [place] [address]      Scheduled Meds:  aspirin  EC  81 mg Oral Daily   atorvastatin   80 mg Oral QPM   azithromycin  500 mg Oral QHS   clopidogrel   75 mg Oral Q breakfast   enoxaparin  (LOVENOX ) injection  40 mg Subcutaneous Q24H   guaiFENesin  600 mg Oral BID   ipratropium-albuterol  3 mL Nebulization TID   levothyroxine   88 mcg Oral Q0600   predniSONE  40 mg Oral Q breakfast   sodium chloride  flush  3 mL Intravenous Q12H   Continuous Infusions:  cefTRIAXone (ROCEPHIN)  IV 2 g (04/02/24 2108)     LOS: 3 days   Time spent: 55 minutes  Camellia Door, DO  [facility]  04/03/2024, 7:25 AM

## 2024-04-05 DIAGNOSIS — I110 Hypertensive heart disease with heart failure: Secondary | ICD-10-CM | POA: Diagnosis not present

## 2024-04-05 DIAGNOSIS — I25118 Atherosclerotic heart disease of native coronary artery with other forms of angina pectoris: Secondary | ICD-10-CM | POA: Diagnosis not present

## 2024-04-05 DIAGNOSIS — I509 Heart failure, unspecified: Secondary | ICD-10-CM | POA: Diagnosis not present

## 2024-04-05 DIAGNOSIS — I700 Atherosclerosis of aorta: Secondary | ICD-10-CM | POA: Diagnosis not present

## 2024-04-05 DIAGNOSIS — I480 Paroxysmal atrial fibrillation: Secondary | ICD-10-CM | POA: Diagnosis not present

## 2024-04-05 DIAGNOSIS — J449 Chronic obstructive pulmonary disease, unspecified: Secondary | ICD-10-CM | POA: Diagnosis not present

## 2024-04-07 DIAGNOSIS — J449 Chronic obstructive pulmonary disease, unspecified: Secondary | ICD-10-CM | POA: Diagnosis not present

## 2024-04-07 DIAGNOSIS — I480 Paroxysmal atrial fibrillation: Secondary | ICD-10-CM | POA: Diagnosis not present

## 2024-04-07 DIAGNOSIS — I700 Atherosclerosis of aorta: Secondary | ICD-10-CM | POA: Diagnosis not present

## 2024-04-07 DIAGNOSIS — I509 Heart failure, unspecified: Secondary | ICD-10-CM | POA: Diagnosis not present

## 2024-04-07 DIAGNOSIS — I110 Hypertensive heart disease with heart failure: Secondary | ICD-10-CM | POA: Diagnosis not present

## 2024-04-07 DIAGNOSIS — I25118 Atherosclerotic heart disease of native coronary artery with other forms of angina pectoris: Secondary | ICD-10-CM | POA: Diagnosis not present

## 2024-04-09 DIAGNOSIS — J449 Chronic obstructive pulmonary disease, unspecified: Secondary | ICD-10-CM | POA: Diagnosis not present

## 2024-04-12 DIAGNOSIS — I110 Hypertensive heart disease with heart failure: Secondary | ICD-10-CM | POA: Diagnosis not present

## 2024-04-12 DIAGNOSIS — I25118 Atherosclerotic heart disease of native coronary artery with other forms of angina pectoris: Secondary | ICD-10-CM | POA: Diagnosis not present

## 2024-04-12 DIAGNOSIS — I700 Atherosclerosis of aorta: Secondary | ICD-10-CM | POA: Diagnosis not present

## 2024-04-12 DIAGNOSIS — I480 Paroxysmal atrial fibrillation: Secondary | ICD-10-CM | POA: Diagnosis not present

## 2024-04-12 DIAGNOSIS — I509 Heart failure, unspecified: Secondary | ICD-10-CM | POA: Diagnosis not present

## 2024-04-12 DIAGNOSIS — J449 Chronic obstructive pulmonary disease, unspecified: Secondary | ICD-10-CM | POA: Diagnosis not present

## 2024-04-18 DIAGNOSIS — J441 Chronic obstructive pulmonary disease with (acute) exacerbation: Secondary | ICD-10-CM | POA: Diagnosis not present

## 2024-04-18 DIAGNOSIS — Z6820 Body mass index (BMI) 20.0-20.9, adult: Secondary | ICD-10-CM | POA: Diagnosis not present

## 2024-04-18 DIAGNOSIS — L8990 Pressure ulcer of unspecified site, unspecified stage: Secondary | ICD-10-CM | POA: Diagnosis not present

## 2024-04-18 DIAGNOSIS — S8012XA Contusion of left lower leg, initial encounter: Secondary | ICD-10-CM | POA: Diagnosis not present

## 2024-04-18 DIAGNOSIS — Z09 Encounter for follow-up examination after completed treatment for conditions other than malignant neoplasm: Secondary | ICD-10-CM | POA: Diagnosis not present

## 2024-04-24 DIAGNOSIS — E7800 Pure hypercholesterolemia, unspecified: Secondary | ICD-10-CM | POA: Diagnosis not present

## 2024-04-24 DIAGNOSIS — E039 Hypothyroidism, unspecified: Secondary | ICD-10-CM | POA: Diagnosis not present

## 2024-04-24 DIAGNOSIS — J449 Chronic obstructive pulmonary disease, unspecified: Secondary | ICD-10-CM | POA: Diagnosis not present

## 2024-04-28 DIAGNOSIS — N8189 Other female genital prolapse: Secondary | ICD-10-CM | POA: Diagnosis not present
